# Patient Record
Sex: Female | Born: 1970 | Race: White | Hispanic: No | Marital: Single | Smoking: Current every day smoker
Health system: Southern US, Community
[De-identification: ages and names within clinical notes are randomized; demographics above are authoritative.]

## PROBLEM LIST (undated history)

## (undated) DIAGNOSIS — J449 Chronic obstructive pulmonary disease, unspecified: Secondary | ICD-10-CM

## (undated) DIAGNOSIS — F329 Major depressive disorder, single episode, unspecified: Secondary | ICD-10-CM

## (undated) DIAGNOSIS — C3490 Malignant neoplasm of unspecified part of unspecified bronchus or lung: Secondary | ICD-10-CM

## (undated) DIAGNOSIS — E785 Hyperlipidemia, unspecified: Secondary | ICD-10-CM

## (undated) DIAGNOSIS — C719 Malignant neoplasm of brain, unspecified: Secondary | ICD-10-CM

## (undated) DIAGNOSIS — F1020 Alcohol dependence, uncomplicated: Secondary | ICD-10-CM

## (undated) DIAGNOSIS — F32A Depression, unspecified: Secondary | ICD-10-CM

## (undated) DIAGNOSIS — F419 Anxiety disorder, unspecified: Secondary | ICD-10-CM

## (undated) DIAGNOSIS — N879 Dysplasia of cervix uteri, unspecified: Secondary | ICD-10-CM

## (undated) DIAGNOSIS — K7581 Nonalcoholic steatohepatitis (NASH): Secondary | ICD-10-CM

## (undated) HISTORY — DX: Anxiety disorder, unspecified: F41.9

## (undated) HISTORY — DX: Nonalcoholic steatohepatitis (NASH): K75.81

## (undated) HISTORY — DX: Major depressive disorder, single episode, unspecified: F32.9

## (undated) HISTORY — DX: Depression, unspecified: F32.A

## (undated) HISTORY — DX: Hyperlipidemia, unspecified: E78.5

## (undated) HISTORY — PX: OTHER SURGICAL HISTORY: SHX169

## (undated) HISTORY — PX: DILATION AND CURETTAGE OF UTERUS: SHX78

---

## 1988-11-19 DIAGNOSIS — N879 Dysplasia of cervix uteri, unspecified: Secondary | ICD-10-CM

## 1988-11-19 HISTORY — DX: Dysplasia of cervix uteri, unspecified: N87.9

## 2010-03-17 ENCOUNTER — Inpatient Hospital Stay (HOSPITAL_COMMUNITY): Admission: AD | Admit: 2010-03-17 | Discharge: 2010-03-17 | Payer: Self-pay | Admitting: Obstetrics & Gynecology

## 2011-02-06 LAB — CBC
HCT: 37 % (ref 36.0–46.0)
RBC: 3.79 MIL/uL — ABNORMAL LOW (ref 3.87–5.11)

## 2012-01-14 ENCOUNTER — Other Ambulatory Visit: Payer: Self-pay | Admitting: Family Medicine

## 2012-02-22 ENCOUNTER — Other Ambulatory Visit: Payer: Self-pay | Admitting: Physician Assistant

## 2012-03-25 ENCOUNTER — Telehealth: Payer: Self-pay

## 2012-03-25 MED ORDER — VENLAFAXINE HCL ER 150 MG PO CP24: ORAL_CAPSULE | ORAL | Status: DC

## 2012-03-25 MED ORDER — VENLAFAXINE HCL ER 75 MG PO CP24: ORAL_CAPSULE | ORAL | Status: DC

## 2012-03-25 NOTE — Telephone Encounter (Signed)
Please pull paper chart. We need this to get her dose.

## 2012-03-25 NOTE — Telephone Encounter (Signed)
Can we refill these? 

## 2012-03-25 NOTE — Telephone Encounter (Signed)
.  [facility] The patient called regarding her Effexor XR Rx.  The patient states she took her last dose today and the pharmacy stated they have sent over refill requests x 2.  The patient states she is planning to schedule CPE in next 30 days.  Please call patient at [phone].  This medication should be sent to [facility] on [address] [address] [facility].

## 2012-03-25 NOTE — Telephone Encounter (Signed)
Effexor has been sent in.  Needs CPE

## 2012-03-25 NOTE — Telephone Encounter (Signed)
Chart is in PA stack

## 2012-03-25 NOTE — Telephone Encounter (Signed)
LMOM THAT RX WAS SENT IN AND SHE NEEDS TO HAVE A CPE.

## 2012-04-09 ENCOUNTER — Other Ambulatory Visit: Payer: Self-pay | Admitting: Physician Assistant

## 2012-04-11 ENCOUNTER — Other Ambulatory Visit: Payer: Self-pay | Admitting: Physician Assistant

## 2012-04-17 ENCOUNTER — Ambulatory Visit: Payer: Self-pay | Admitting: Family Medicine

## 2012-04-17 ENCOUNTER — Encounter: Payer: Self-pay | Admitting: Family Medicine

## 2012-04-17 VITALS — BP 114/80 | HR 97 | Temp 98.8°F | Resp 20 | Ht 66.0 in | Wt 140.2 lb

## 2012-04-17 DIAGNOSIS — Z0289 Encounter for other administrative examinations: Secondary | ICD-10-CM

## 2012-04-17 DIAGNOSIS — Z1239 Encounter for other screening for malignant neoplasm of breast: Secondary | ICD-10-CM

## 2012-04-17 DIAGNOSIS — E785 Hyperlipidemia, unspecified: Secondary | ICD-10-CM

## 2012-04-17 DIAGNOSIS — Z720 Tobacco use: Secondary | ICD-10-CM | POA: Insufficient documentation

## 2012-04-17 DIAGNOSIS — F411 Generalized anxiety disorder: Secondary | ICD-10-CM

## 2012-04-17 DIAGNOSIS — Z0000 Encounter for general adult medical examination without abnormal findings: Secondary | ICD-10-CM

## 2012-04-17 DIAGNOSIS — F409 Phobic anxiety disorder, unspecified: Secondary | ICD-10-CM

## 2012-04-17 LAB — COMPREHENSIVE METABOLIC PANEL
ALT: 38 U/L — ABNORMAL HIGH (ref 0–35)
AST: 43 U/L — ABNORMAL HIGH (ref 0–37)
Alkaline Phosphatase: 77 U/L (ref 39–117)
CO2: 24 mEq/L (ref 19–32)
Chloride: 100 mEq/L (ref 96–112)
Creat: 0.59 mg/dL (ref 0.50–1.10)
Glucose, Bld: 68 mg/dL — ABNORMAL LOW (ref 70–99)
Potassium: 4.6 mEq/L (ref 3.5–5.3)

## 2012-04-17 LAB — POCT URINALYSIS DIPSTICK
Bilirubin, UA: NEGATIVE
Blood, UA: NEGATIVE
Leukocytes, UA: NEGATIVE
Urobilinogen, UA: 0.2

## 2012-04-17 LAB — LIPID PANEL
Cholesterol: 263 mg/dL — ABNORMAL HIGH (ref 0–200)
HDL: 61 mg/dL (ref >39)

## 2012-04-17 MED ORDER — VENLAFAXINE HCL ER 75 MG PO CP24: ORAL_CAPSULE | ORAL | Status: DC

## 2012-04-17 MED ORDER — ALPRAZOLAM 0.5 MG PO TABS: ORAL_TABLET | ORAL | Status: DC

## 2012-04-17 MED ORDER — VENLAFAXINE HCL ER 150 MG PO CP24: ORAL_CAPSULE | ORAL | Status: DC

## 2012-04-17 NOTE — Patient Instructions (Signed)
Vitamin D Deficiency  Not having enough vitamin D is called a deficiency. Your body needs this vitamin to keep your bones strong and healthy. Having too little of it can make your bones soft or can cause other health problems.  HOME CARE  Take all vitamins, herbs, or nutrition drinks (supplements) as told by your doctor.   Have your blood tested 2 months after taking vitamins, herbs, or nutrition drinks.   Eat foods that have vitamin D. This includes:   Dairy products, cereals, or juices with added vitamin D. Check the label.   Fatty fish like salmon or trout.   Eggs.   Oysters.   Go outside for 10 to 15 minutes when the sun is shining. Do this 3 times a week. Do not do this if you have skin cancer.   Do not use tanning beds.   Stay at a healthy weight. Lose weight if needed.   Keep all doctor visits as told.  GET HELP IF:  You have questions.   You continue to have problems.   You feel sick to your stomach (nauseous) or throw up (vomit).   You cannot go poop (constipated).   You feel confused.   You have severe belly (abdominal) or back pain.  MAKE SURE YOU:  Understand these instructions.   Will watch your condition.   Will get help right away if you are not doing well or get worse.  Document Released: 10/25/2011 Document Reviewed: 10/23/2011 ExitCare Patient Information 2012 [employer]   .Keeping You Healthy  Get These Tests 1. Blood Pressure- Have your blood pressure checked once a year by your health care provider.  Normal blood pressure is 120/80. 2. Weight- Have your body mass index (BMI) calculated to screen for obesity.  BMI is measure of body fat based on height and weight.  You can also calculate your own BMI at www.nhlbisupport.com/bmi/. 3. Cholesterol- Have your cholesterol checked every 5 years starting at age 20 then yearly starting at age 45. 4. Chlamydia, HIV, and other sexually transmitted diseases- Get screened every year until age 25,  then within three months of each new sexual provider. 5. Pap Smear- Every 1-3 years; discuss with your health care provider. 6. Mammogram- Every year starting at age 40  Take these medicines  Calcium with Vitamin D-Your body needs 1200 mg of Calcium each day and 800-1000 IU of Vitamin D daily.  Your body can only absorb 500 mg of Calcium at a time so Calcium must be taken in 2 or 3 divided doses throughout the day.  Multivitamin with folic acid- Once daily if it is possible for you to become pregnant.  Get these Immunizations  Gardasil-Series of three doses; prevents HPV related illness such as genital warts and cervical cancer.  Menactra-Single dose; prevents meningitis.  Tetanus shot- Every 10 years.  Flu shot-Every year.  Take these steps 1. Do not smoke-Your healthcare provider can help you quit.  For tips on how to quit go to www.smokefree.gov or call 1-800 QUITNOW. 2. Be physically active- Exercise 5 days a week for at least 30 minutes.  If you are not already physically active, start slow and gradually work up to 30 minutes of moderate physical activity.  Examples of moderate activity include walking briskly, dancing, swimming, bicycling, etc. 3. Breast Cancer- A self breast exam every month is important for early detection of breast cancer.  For more information and instruction on self breast exams, ask your healthcare provider or www.womenshealth.gov/faq/breast-self-exam.cfm. 4.   Eat a healthy diet- Eat a variety of healthy foods such as fruits, vegetables, whole grains, low fat milk, low fat cheeses, yogurt, lean meats, poultry and fish, beans, nuts, tofu, etc.  For more information go to www. Thenutritionsource.org 5. Drink alcohol in moderation- Limit alcohol intake to one drink or less per day. Never drink and drive. 6. Depression- Your emotional health is as important as your physical health.  If you're feeling down or losing interest in things you normally enjoy please talk to  your healthcare provider about being screened for depression. 7. Dental visit- Brush and floss your teeth twice daily; visit your dentist twice a year. 8. Eye doctor- Get an eye exam at least every 2 years. 9. Helmet use- Always wear a helmet when riding a bicycle, motorcycle, rollerblading or skateboarding. 10. Safe sex- If you may be exposed to sexually transmitted infections, use a condom. 11. Seat belts- Seat belts can save your live; always wear one. 12. Smoke/Carbon Monoxide detectors- These detectors need to be installed on the appropriate level of your home. Replace batteries at least once a year. 13. Skin cancer- When out in the sun please cover up and use sunscreen 15 SPF or higher. 14. Violence- If anyone is threatening or hurting you, please tell your healthcare provider.        

## 2012-04-17 NOTE — Progress Notes (Signed)
Subjective:    Patient ID: Erika Hunter, female    DOB: March 02, 1971, 41 y.o.   MRN: [id number]  HPI  This 41 y.o Cauc female is here for CPE (PAP done Sep 13, 2011- negative/normal). She has chronic  GAD , treated effectively with Alprazolam and Effexor (generic).  She is a smoker (no desire to quit) and   drinks 2 Red Bulls every AM. She is a social drinker. She is divorced and works as a Solicitor. No regular exercise.  ECG done 01/23/2011 showed NSR, no acute findings. She has not had MMG because of noninsured status.    Review of Systems  Constitutional: Negative.   HENT: Positive for congestion and mouth sores. Negative for sore throat, rhinorrhea, sneezing, postnasal drip and sinus pressure.   Eyes:       Dry eyes  Respiratory: Positive for cough and chest tightness. Negative for apnea, shortness of breath and wheezing.   Cardiovascular: Negative.   Gastrointestinal: Negative.   Genitourinary: Positive for genital sores. Negative for dysuria, frequency, hematuria, vaginal bleeding, vaginal discharge and pelvic pain.       "bump"on perineum that she asked her daughter to squeeze- no pus expressed  Menses "erratic"  Musculoskeletal: Negative.   Skin: Positive for pallor.       Lower legs with mottled discoloration all her life  Neurological: Negative.   Psychiatric/Behavioral: Positive for dysphoric mood and agitation. Negative for suicidal ideas, sleep disturbance and decreased concentration. The patient is nervous/anxious.        Objective:   Physical Exam  Vitals reviewed. Constitutional: She is oriented to person, place, and time. She appears well-developed and well-nourished. No distress.  HENT:  Head: Normocephalic and atraumatic.  Right Ear: External ear normal.  Left Ear: External ear normal.  Nose: Nose normal.  Mouth/Throat: Oropharynx is clear and moist. No oropharyngeal exudate.       Post pharynx erythematous  Eyes: Conjunctivae and EOM are normal. Pupils are  equal, round, and reactive to light. No scleral icterus.  Neck: Normal range of motion. Neck supple. No thyromegaly present.  Cardiovascular: Normal rate, regular rhythm, normal heart sounds and intact distal pulses.  Exam reveals no gallop and no friction rub.   No murmur heard. Pulmonary/Chest: Effort normal and breath sounds normal. No respiratory distress. She has no wheezes. Right breast exhibits no inverted nipple, no mass, no nipple discharge, no skin change and no tenderness. Left breast exhibits no inverted nipple, no mass, no nipple discharge, no skin change and no tenderness. Breasts are symmetrical.  Abdominal: Soft. Bowel sounds are normal. She exhibits no mass. There is no rebound and no guarding.       Mild RLQ tenderness  Musculoskeletal: Normal range of motion. She exhibits no edema and no tenderness.  Lymphadenopathy:    She has no cervical adenopathy.  Neurological: She is alert and oriented to person, place, and time. She has normal reflexes. No cranial nerve deficit. She exhibits normal muscle tone. Coordination normal.  Skin: Skin is warm and dry.       Mottled skin a few inches above and below knee area  Psychiatric: She has a normal mood and affect. Her behavior is normal. Judgment and thought content normal.    Results for orders placed in visit on 04/17/12  POCT URINALYSIS DIPSTICK      Component Value Range   Color, UA yellow     Clarity, UA clear     Glucose, UA neg  Bilirubin, UA neg     Ketones, UA trace     Spec Grav, UA 1.010     Blood, UA neg     pH, UA 7.5     Protein, UA trace     Urobilinogen, UA 0.2     Nitrite, UA neg     Leukocytes, UA Negative     LABS 01/25/2011:  CR= 0.66  AST/SGOT= 41   ALT/SGPT= 54   TChol= 284  HDL= 74  LDL= 187   TGs= 113      Assessment & Plan:   1. Routine general medical examination at a health care facility  Lipid panel, Comprehensive metabolic panel, POCT urinalysis dipstick  2. Hyperlipidemia LDL goal < 100   Pt has lost ~ 25-30 lbs in last 18 months (expect lipid profile to be improved)  3. Generalized anxiety disorder  RF: Venlafaxine XR  75 mg+ 150 mg (225 mg total daily dose) for 12 months Continue Alprazolam prn   4. Screening for breast cancer  MM Digital Screening  5. Tobacco user  Pt not interested in quitting at this time

## 2012-04-23 ENCOUNTER — Encounter: Payer: Self-pay | Admitting: Family Medicine

## 2012-04-23 ENCOUNTER — Other Ambulatory Visit: Payer: Self-pay | Admitting: Family Medicine

## 2012-04-23 DIAGNOSIS — E785 Hyperlipidemia, unspecified: Secondary | ICD-10-CM

## 2012-04-23 NOTE — Progress Notes (Signed)
Quick Note:  Please call pt and advise that the following labs are abnormal...  Total chol and LDL("bad") chol are still elevated. Try Omega 3 Fish Oil 1200 mg OTC Take 1 capsule daily and will recheck lipids in 8 weeks. If still elevated, need to consider prescription medication.  Do not want to use statin to lower lipids because liver tests are very slightly elevated (probably due to "fatty liver"). Blood sugar and kidney function are normal.   Copy of labs to pt. ______

## 2012-05-07 ENCOUNTER — Ambulatory Visit: Payer: Self-pay

## 2012-05-07 ENCOUNTER — Ambulatory Visit: Payer: Self-pay | Admitting: Emergency Medicine

## 2012-05-07 VITALS — BP 121/84 | HR 105 | Temp 99.1°F | Resp 20 | Ht 64.5 in | Wt 140.8 lb

## 2012-05-07 DIAGNOSIS — M79643 Pain in unspecified hand: Secondary | ICD-10-CM

## 2012-05-07 DIAGNOSIS — M79609 Pain in unspecified limb: Secondary | ICD-10-CM

## 2012-05-07 NOTE — Progress Notes (Signed)
  Subjective:    Patient ID: Erika Hunter, female    DOB: February 20, 1971, 41 y.o.   MRN: [id number]  Hand Pain  The incident occurred 2 days ago. The incident occurred at home. The injury mechanism was a fall. The pain is present in the right hand. The quality of the pain is described as aching. The pain radiates to the right arm. The pain is at a severity of 3/10. The pain is mild. The pain has been constant since the incident. The symptoms are aggravated by movement and lifting. She has tried acetaminophen for the symptoms.      Review of Systems  Constitutional: Negative.   HENT: Negative.   Eyes: Negative.   Respiratory: Negative.   Cardiovascular: Negative.   Gastrointestinal: Negative.   Musculoskeletal: Positive for arthralgias. Negative for joint swelling.  Neurological: Negative.        Objective:   Physical Exam  Nursing note and vitals reviewed. Constitutional: She is oriented to person, place, and time. She appears well-developed and well-nourished.  HENT:  Head: Normocephalic and atraumatic.  Eyes: Conjunctivae are normal. Pupils are equal, round, and reactive to light. No scleral icterus.  Neck: Normal range of motion. Neck supple.  Cardiovascular: Normal rate.   Pulmonary/Chest: Effort normal.  Abdominal: Soft.  Musculoskeletal: Normal range of motion. She exhibits tenderness (dorsum right hand).  Neurological: She is alert and oriented to person, place, and time.  Skin: Skin is warm and dry.          Assessment & Plan:  Sprain hand  Splint RICE Motrin  [facility] reading (PRIMARY) by  Dr. Dareen Piano.  Negative.

## 2012-09-30 ENCOUNTER — Ambulatory Visit: Payer: Self-pay | Admitting: Family Medicine

## 2012-10-10 ENCOUNTER — Other Ambulatory Visit: Payer: Self-pay | Admitting: Family Medicine

## 2012-10-12 ENCOUNTER — Other Ambulatory Visit: Payer: Self-pay | Admitting: Family Medicine

## 2012-10-12 MED ORDER — ALPRAZOLAM 0.5 MG PO TABS: ORAL_TABLET | ORAL | Status: DC

## 2012-10-12 NOTE — Telephone Encounter (Signed)
I called med refill to pharmacy; #30 authorized with 1 additional refill.

## 2012-10-12 NOTE — Progress Notes (Signed)
I reviewed the pt's chart and called in refill for Alprazolam to pharmacy. Pt to be notified.

## 2012-12-22 ENCOUNTER — Other Ambulatory Visit: Payer: Self-pay | Admitting: Family Medicine

## 2012-12-22 NOTE — Telephone Encounter (Signed)
Alprazolam 0.5 mg tab  #30 w/ 1 refill was phoned in to [facility] at [address].

## 2012-12-23 NOTE — Telephone Encounter (Signed)
This medication was called to pharmacy on 12/22/12.

## 2013-01-08 ENCOUNTER — Other Ambulatory Visit: Payer: Self-pay | Admitting: Family Medicine

## 2013-01-09 NOTE — Telephone Encounter (Signed)
Needs office visit.

## 2013-01-09 NOTE — Telephone Encounter (Signed)
Chart pulled to PA pool [id number]

## 2013-01-09 NOTE — Telephone Encounter (Signed)
Please pull paper chart, medication not in epic.

## 2013-03-24 ENCOUNTER — Other Ambulatory Visit: Payer: Self-pay | Admitting: Family Medicine

## 2013-03-25 ENCOUNTER — Other Ambulatory Visit: Payer: Self-pay | Admitting: Family Medicine

## 2013-05-13 ENCOUNTER — Other Ambulatory Visit: Payer: Self-pay | Admitting: Physician Assistant

## 2013-05-26 ENCOUNTER — Other Ambulatory Visit: Payer: Self-pay | Admitting: Family Medicine

## 2013-05-27 ENCOUNTER — Telehealth: Payer: Self-pay

## 2013-05-27 MED ORDER — ALPRAZOLAM 0.5 MG PO TABS: ORAL_TABLET | ORAL | Status: DC

## 2013-05-27 NOTE — Telephone Encounter (Signed)
Pharm reqs RF of xanax 0.5 mg. Pt hasn't been seen in > year. I didn't know if you would want to refuse, or send in w/note needs to RTC for add'l RFs? I have pended it w/note for your review.

## 2013-05-27 NOTE — Telephone Encounter (Signed)
I phoned refill for Alprazolam to pharmacy.

## 2013-05-27 NOTE — Telephone Encounter (Signed)
Needs OV, last seen over 1 yr ago 

## 2013-07-07 ENCOUNTER — Telehealth: Payer: Self-pay

## 2013-07-07 NOTE — Telephone Encounter (Signed)
PT WANT TO SPEAK WITH A DR OR NURSE ABOUT SOME MEDICATION AND SHE DIDN'T WANT TO EXPLAIN TO ME ANYTHING ABOUT IT. STATES SHE ONLY WANT TO SPEAK WITH THEM PLEASE CALL [phone]

## 2013-07-07 NOTE — Telephone Encounter (Signed)
Left message to return call 

## 2013-07-08 ENCOUNTER — Telehealth: Payer: Self-pay

## 2013-07-08 NOTE — Telephone Encounter (Signed)
PT STATES SOMEONE HAD CALLED AND SHE MISSED THE CALL, IS AT WORK NOW AND WOULD LIKE A CALL BACK AT [phone]

## 2013-07-08 NOTE — Telephone Encounter (Signed)
Left message on machine for patient to call back.

## 2013-07-09 MED ORDER — VENLAFAXINE HCL ER 75 MG PO CP24: ORAL_CAPSULE | ORAL | Status: DC

## 2013-07-09 MED ORDER — ALPRAZOLAM 0.5 MG PO TABS: ORAL_TABLET | ORAL | Status: DC

## 2013-07-09 MED ORDER — VENLAFAXINE HCL ER 150 MG PO CP24: ORAL_CAPSULE | ORAL | Status: DC

## 2013-07-09 NOTE — Telephone Encounter (Signed)
1. She can still pay in installments, but then doesn't qualify for the 30% discount for payment in full.  She may qualify for financial assistance for patients (FAPP), and can call  [phone].  2. I've refilled the meds for 1 month (she hasn't been seen since 04/2012). She needs an OV for more. PLEASE PHONE IN the [facility] as written below.  Meds ordered this encounter  Medications  . ALPRAZolam (XANAX) 0.5 MG tablet    Sig: Take one half to one tablet by mouth every day as needed for anxiety. PATIENT NEEDS OFFICE VISIT FOR ADDITIONAL REFILLS    Dispense:  30 tablet    Refill:  0    Order Specific Question:  Supervising Provider    Answer:  DOOLITTLE, ROBERT P [[id number]]  . venlafaxine XR (EFFEXOR-XR) 150 MG 24 hr capsule    Sig: TAKE 1 CAPSULE BY MOUTH ONCE DAILY ALONG WITH 75MG  CAPSULE    Dispense:  30 capsule    Refill:  0    NEEDS OFFICE VISIT!!!    Order Specific Question:  Supervising Provider    Answer:  DOOLITTLE, ROBERT P [[id number]]  . venlafaxine XR (EFFEXOR-XR) 75 MG 24 hr capsule    Sig: TAKE 1 CAPSULE BY MOUTH ONCE DAILY ALONG WITH 150MG  CAPSULE    Dispense:  30 capsule    Refill:  0    NEEDS OFFICE VISIT!!!    Order Specific Question:  Supervising Provider    Answer:  Ellamae Sia P [[id number]]

## 2013-07-09 NOTE — Telephone Encounter (Signed)
Ok. Called in Xanax. Called pt to let her know. Spoke with pt.and gave her the number for FAPP.

## 2013-07-09 NOTE — Telephone Encounter (Signed)
Spoke with pt she is out of refills on all her meds. Alprazolam, Effexor 150 mg, and Effexor 75 mg. She states that she has to save up to come in because since [facility] has taken over she has to pay in full now when before she made 50 dollar payments until her bill was paid off. She was wondering if she could have 2 months worth of medication. Please advise. Dr Audria Nine still out of town?

## 2013-07-09 NOTE — Telephone Encounter (Signed)
Called pt again, LMOM to CB if she still needed our advise.

## 2013-08-13 ENCOUNTER — Ambulatory Visit: Payer: Self-pay | Admitting: Family Medicine

## 2013-08-13 ENCOUNTER — Encounter: Payer: Self-pay | Admitting: Family Medicine

## 2013-08-13 VITALS — HR 88 | Temp 99.9°F | Resp 16 | Ht 64.5 in | Wt 139.4 lb

## 2013-08-13 DIAGNOSIS — E7800 Pure hypercholesterolemia, unspecified: Secondary | ICD-10-CM

## 2013-08-13 DIAGNOSIS — Z0000 Encounter for general adult medical examination without abnormal findings: Secondary | ICD-10-CM

## 2013-08-13 DIAGNOSIS — Z1231 Encounter for screening mammogram for malignant neoplasm of breast: Secondary | ICD-10-CM

## 2013-08-13 DIAGNOSIS — F411 Generalized anxiety disorder: Secondary | ICD-10-CM

## 2013-08-13 LAB — POCT URINALYSIS DIPSTICK
Glucose, UA: NEGATIVE
Nitrite, UA: NEGATIVE
Protein, UA: NEGATIVE
pH, UA: 8.5

## 2013-08-13 MED ORDER — ALPRAZOLAM 0.5 MG PO TABS: ORAL_TABLET | ORAL | Status: DC

## 2013-08-13 MED ORDER — ALBUTEROL SULFATE HFA 108 (90 BASE) MCG/ACT IN AERS: 2.0000 | INHALATION_SPRAY | RESPIRATORY_TRACT | Status: DC | PRN

## 2013-08-13 MED ORDER — VENLAFAXINE HCL ER 150 MG PO CP24: ORAL_CAPSULE | ORAL | Status: DC

## 2013-08-13 MED ORDER — VENLAFAXINE HCL ER 75 MG PO CP24: ORAL_CAPSULE | ORAL | Status: DC

## 2013-08-13 NOTE — Progress Notes (Signed)
Subjective:    Patient ID: Erika Hunter, female    DOB: 1971/06/28, 42 y.o.   MRN: [id number]  HPI  This 42 y.o. Cauc female is here for annual exam and fasting labs. She has chronic GAD and hypercholesterolemia for which she takes prescription medications. She reports no adverse effects.   HCM: PAP- 2012 (negative)            MMG- needs to be scheduled.            IMM- current.            Vision- current.  Patient Active Problem List   Diagnosis Date Noted  . Hyperlipidemia LDL goal < 100 04/17/2012  . Generalized anxiety disorder 04/17/2012  . Tobacco user 04/17/2012    Review of Systems  Constitutional: Negative.   HENT: Negative.   Eyes: Positive for photophobia and redness.  Respiratory: Positive for chest tightness and wheezing.   Endocrine: Negative.   Genitourinary: Negative.   Musculoskeletal: Negative.   Skin: Negative.   Allergic/Immunologic: Negative.   Neurological: Negative.   Hematological: Negative.   Psychiatric/Behavioral: Negative.        Objective:   Physical Exam  Nursing note and vitals reviewed. Constitutional: She is oriented to person, place, and time. Vital signs are normal. She appears well-developed and well-nourished. No distress.  HENT:  Head: Normocephalic and atraumatic.  Right Ear: Hearing, tympanic membrane, external ear and ear canal normal.  Left Ear: Hearing, tympanic membrane, external ear and ear canal normal.  Nose: Nose normal. No mucosal edema, rhinorrhea, nasal deformity or septal deviation.  Mouth/Throat: Uvula is midline, oropharynx is clear and moist and mucous membranes are normal. No oral lesions. Normal dentition. No dental caries.  Eyes: EOM and lids are normal. Right conjunctiva is injected. Left conjunctiva is injected. No scleral icterus.  Fundoscopic exam:      The right eye shows no arteriolar narrowing, no AV nicking and no papilledema. The right eye shows red reflex.       The left eye shows no arteriolar  narrowing, no AV nicking and no papilledema. The left eye shows red reflex.  Neck: Full passive range of motion without pain. Neck supple. No JVD present. No spinous process tenderness and no muscular tenderness present. No erythema and normal range of motion present. No mass and no thyromegaly present.  Cardiovascular: Normal rate, regular rhythm, S1 normal, S2 normal, normal heart sounds, intact distal pulses and normal pulses.   No extrasystoles are present. PMI is not displaced.  Exam reveals no gallop, no distant heart sounds and no friction rub.   No murmur heard. Pulmonary/Chest: Effort normal and breath sounds normal. No respiratory distress. Right breast exhibits no inverted nipple, no mass, no nipple discharge, no skin change and no tenderness. Left breast exhibits no inverted nipple, no mass, no nipple discharge, no skin change and no tenderness. Breasts are symmetrical.  Abdominal: Soft. Normal appearance and bowel sounds are normal. She exhibits no distension, no pulsatile midline mass and no mass. There is no hepatosplenomegaly. There is no tenderness. There is no guarding and no CVA tenderness. No hernia.  Musculoskeletal: Normal range of motion. She exhibits no edema and no tenderness.  Lymphadenopathy:       Head (right side): No submental, no submandibular, no tonsillar, no posterior auricular and no occipital adenopathy present.       Head (left side): No submental, no submandibular, no tonsillar and no occipital adenopathy present.    She  has no cervical adenopathy.    She has no axillary adenopathy.       Right: No inguinal and no supraclavicular adenopathy present.       Left: No inguinal and no supraclavicular adenopathy present.  Neurological: She is alert and oriented to person, place, and time. She has normal strength and normal reflexes. She displays no atrophy. No cranial nerve deficit or sensory deficit. She exhibits normal muscle tone. Coordination and gait normal.  Skin:  Skin is warm, dry and intact. No ecchymosis, no lesion and no rash noted. She is not diaphoretic. No cyanosis or erythema. No pallor. Nails show no clubbing.  Psychiatric: She has a normal mood and affect. Her speech is normal and behavior is normal. Judgment and thought content normal. Cognition and memory are normal.       Assessment & Plan:  Routine general medical examination at a health care facility - Plan: POCT urinalysis dipstick  Pure hypercholesterolemia - Plan: Lipid panel  Generalized anxiety disorder  Other screening mammogram - Plan: MM Digital Screening  Meds ordered this encounter  Medications  . Multiple Vitamins-Minerals (MULTI-VITAMIN GUMMIES PO)    Sig: Take by mouth daily.  . Omega-3 Fatty Acids (FISH OIL PO)    Sig: Take by mouth daily.  Marland Kitchen albuterol (PROAIR HFA) 108 (90 BASE) MCG/ACT inhaler    Sig: Inhale 2 puffs into the lungs every 4 (four) hours as needed for wheezing.    Dispense:  1 each    Refill:  5  . venlafaxine XR (EFFEXOR-XR) 150 MG 24 hr capsule    Sig: TAKE 1 CAPSULE BY MOUTH ONCE DAILY ALONG WITH 75MG  CAPSULE    Dispense:  90 capsule    Refill:  0            . venlafaxine XR (EFFEXOR-XR) 75 MG 24 hr capsule    Sig: TAKE 1 CAPSULE BY MOUTH ONCE DAILY ALONG WITH 150MG  CAPSULE    Dispense:  90 capsule    Refill:  3            . ALPRAZolam (XANAX) 0.5 MG tablet    Sig: Take one half to one tablet by mouth every day as needed for anxiety.    Dispense:  30 tablet    Refill:  2

## 2013-08-13 NOTE — Patient Instructions (Signed)
Keeping You Healthy  Get These Tests 1. Blood Pressure- Have your blood pressure checked once a year by your health care provider.  Normal blood pressure is 120/80. 2. Weight- Have your body mass index (BMI) calculated to screen for obesity.  BMI is measure of body fat based on height and weight.  You can also calculate your own BMI at https://www.west-esparza.com/. 3. Cholesterol- Have your cholesterol checked every 5 years starting at age 44 then yearly starting at age 54. 4. Chlamydia, HIV, and other sexually transmitted diseases- Get screened every year until age 58, then within three months of each new sexual provider. 5. Pap Smear- Every 1-3 years; discuss with your health care provider. Next PAP due in October 2015. 6. Mammogram- Every year starting at age 27. I have placed an order for this screening; [facility] has a financial assistance program and can perhaps discount the cost since you are self-pay.  Take these medicines  Calcium with Vitamin D-Your body needs 1200 mg of Calcium each day and [phone] IU of Vitamin D daily.  Your body can only absorb 500 mg of Calcium at a time so Calcium must be taken in 2 or 3 divided doses throughout the day.  Multivitamin with folic acid- Once daily if it is possible for you to become pregnant.  Get these Immunizations  Gardasil-Series of three doses; prevents HPV related illness such as genital warts and cervical cancer.  Menactra-Single dose; prevents meningitis.  Tetanus shot- Every 10 years. Tdap was given in March 2012; next Tetanus id due in 2022.  Flu shot-Every year.  Take these steps 1. Do not smoke-Your healthcare provider can help you quit.  For tips on how to quit go to www.smokefree.gov or call 1-800 QUITNOW. 2. Be physically active- Exercise 5 days a week for at least 30 minutes.  If you are not already physically active, start slow and gradually work up to 30 minutes of moderate physical activity.  Examples of moderate  activity include walking briskly, dancing, swimming, bicycling, etc. 3. Breast Cancer- A self breast exam every month is important for early detection of breast cancer.  For more information and instruction on self breast exams, ask your healthcare provider or [email or web address].[email or web address]. 4. Eat a healthy diet- Eat a variety of healthy foods such as fruits, vegetables, whole grains, low fat milk, low fat cheeses, yogurt, lean meats, poultry and fish, beans, nuts, tofu, etc.  For more information go to www. Thenutritionsource.org 5. Drink alcohol in moderation- Limit alcohol intake to one drink or less per day. Never drink and drive. 6. Depression- Your emotional health is as important as your physical health.  If you're feeling down or losing interest in things you normally enjoy please talk to your healthcare provider about being screened for depression. 7. Dental visit- Brush and floss your teeth twice daily; visit your dentist twice a year. 8. Eye doctor- Get an eye exam at least every 2 years. 9. Helmet use- Always wear a helmet when riding a bicycle, motorcycle, rollerblading or skateboarding. 10. Safe sex- If you may be exposed to sexually transmitted infections, use a condom. 11. Seat belts- Seat belts can save your live; always wear one. 12. Smoke/Carbon Monoxide detectors- These detectors need to be installed on the appropriate level of your home. Replace batteries at least once a year. 13. Skin cancer- When out in the sun please cover up and use sunscreen 15 SPF or higher. 14. Violence- If anyone is threatening or hurting you, please tell  your healthcare provider.

## 2013-08-14 LAB — LIPID PANEL
Cholesterol: 235 mg/dL — ABNORMAL HIGH (ref 0–200)
HDL: 62 mg/dL (ref >39)
VLDL: 27 mg/dL (ref 0–40)

## 2013-08-20 NOTE — Progress Notes (Signed)
Quick Note:  Please advise pt regarding following labs...  Total and LDL ("bad") cholesterol are still above normal but much better than 1 year ago. Continue taking Fish Oil 2 grams daily and eat as healthy as possible. Stay active. Your heart disease risk is below average.   Copy to pt.. ______

## 2013-11-10 ENCOUNTER — Other Ambulatory Visit: Payer: Self-pay | Admitting: Family Medicine

## 2013-11-18 ENCOUNTER — Other Ambulatory Visit: Payer: Self-pay | Admitting: Family Medicine

## 2013-11-20 ENCOUNTER — Other Ambulatory Visit: Payer: Self-pay | Admitting: Family Medicine

## 2013-11-20 NOTE — Telephone Encounter (Signed)
Alprazolam refill phoned to pt's pharmacy.

## 2013-11-23 ENCOUNTER — Telehealth: Payer: Self-pay

## 2013-11-23 MED ORDER — VENLAFAXINE HCL ER 150 MG PO CP24: ORAL_CAPSULE | ORAL | Status: DC

## 2013-11-23 NOTE — Telephone Encounter (Signed)
Resent to the pharmacy, was previously sent to [facility] sent to [facility]

## 2013-11-23 NOTE — Telephone Encounter (Signed)
Patient calling to let Dr. Leward Quan to please fix rx. To calrify that she did not receive a year supply on the venlafaxine XR (EFFEXOR-XR) 150 MG 24 hr capsule and would like that fixed as soon as possible. She needs 3 more refills on it to make it a year.   Pharmacy: [facility] [address] ([address]), [place] - [place]  [place]: [phone]

## 2013-12-04 ENCOUNTER — Other Ambulatory Visit: Payer: Self-pay | Admitting: Physician Assistant

## 2014-02-22 ENCOUNTER — Other Ambulatory Visit: Payer: Self-pay | Admitting: Family Medicine

## 2014-02-22 NOTE — Telephone Encounter (Signed)
Alprazolam refill phoned to pt's pharmacy.

## 2014-03-15 ENCOUNTER — Ambulatory Visit: Payer: Self-pay | Admitting: Physician Assistant

## 2014-03-15 ENCOUNTER — Encounter: Payer: Self-pay | Admitting: Physician Assistant

## 2014-03-15 VITALS — BP 126/81 | HR 87 | Temp 98.3°F | Resp 16 | Ht 65.5 in | Wt 137.0 lb

## 2014-03-15 DIAGNOSIS — F32A Depression, unspecified: Secondary | ICD-10-CM

## 2014-03-15 DIAGNOSIS — L723 Sebaceous cyst: Secondary | ICD-10-CM

## 2014-03-15 DIAGNOSIS — F329 Major depressive disorder, single episode, unspecified: Secondary | ICD-10-CM

## 2014-03-15 MED ORDER — VENLAFAXINE HCL ER 150 MG PO CP24: ORAL_CAPSULE | ORAL | Status: DC

## 2014-03-15 NOTE — Progress Notes (Signed)
Subjective:    Patient ID: Erika Hunter, female    DOB: 11-Nov-1971, 43 y.o.   MRN: [id number]  HPI Primary Physician: No primary provider on file.  Chief Complaint: Cyst  HPI: 43 y.o. female with history below presents for cyst removal. Started off the size of a pimple 5 years ago and has slowly gotten a little bigger over time. Never with pain or erythema. Never with drainage or discharge. She has tried to express drainage from the lesion at home without success. She is concerned due to the lack of hair growth along the lesion. Afebrile.   She also requests for 2 more refills of her Effexor 150 mg XR to be sent in. This is managed by Erika Hunter. She takes Effexor 75 mg XR as well as Effexor 150 mg XR daily. When the 150 mg was last sent in it did not get sent in for the same time span as her 75 mg dose. Tolerating well. No issues.    Past Medical History  Diagnosis Date  . Anxiety   . Asthma   . Depression   . Hyperlipidemia   . Cancer      Home Meds: Prior to Admission medications   Medication Sig Start Date End Date Taking? Authorizing Provider  albuterol (PROAIR HFA) 108 (90 BASE) MCG/ACT inhaler Inhale 2 puffs into the lungs every 4 (four) hours as needed for wheezing or shortness of breath. 12/04/13  Yes Erika Fanny, MD  ALPRAZolam Duanne Moron) 0.5 MG tablet TAKE ONE-HALF TO ONE TABLET BY MOUTH ONCE DAILY AS NEEDED FOR ANXIETY 02/22/14  Yes Erika Fanny, MD  Multiple Vitamins-Minerals (MULTI-VITAMIN GUMMIES PO) Take by mouth daily.   Yes Historical Provider, MD  Omega-3 Fatty Acids (FISH OIL PO) Take by mouth daily.   Yes Historical Provider, MD  venlafaxine XR (EFFEXOR-XR) 150 MG 24 hr capsule TAKE 1 CAPSULE BY MOUTH ONCE DAILY ALONG WITH 75MG  CAPSULE 11/23/13  Yes Erika S Jeffery, PA-C  venlafaxine XR (EFFEXOR-XR) 75 MG 24 hr capsule TAKE 1 CAPSULE BY MOUTH ONCE DAILY ALONG WITH 150MG  CAPSULE 08/13/13  Yes Erika Fanny, MD    Allergies: No Known  Allergies  History   Social History  . Marital Status: Single    Spouse Name: N/A    Number of Children: N/A  . Years of Education: N/A   Occupational History  . Not on file.   Social History Main Topics  . Smoking status: Current Every Day Smoker -- 1.00 packs/day for 25 years  . Smokeless tobacco: Not on file  . Alcohol Use: Yes  . Drug Use: No  . Sexual Activity: Yes   Other Topics Concern  . Not on file   Social History Narrative  . No narrative on file     Review of Systems     Objective:   Physical Exam  Physical Exam: Blood pressure 126/81, pulse 87, temperature 98.3 F (36.8 C), temperature source Oral, resp. rate 16, height 5' 5.5" (1.664 m), weight 137 lb (62.143 kg), SpO2 95.00%., Body mass index is 22.44 kg/(m^2). General: Well developed, well nourished, in no acute distress. Head: Normocephalic, atraumatic, eyes without discharge, sclera non-icteric, nares are without discharge.   Neck: Supple. Full ROM.  Lungs: Breathing is unlabored. Heart: Regular rate. Msk:  Strength and tone normal for age. Extremities/Skin: Warm and dry. No clubbing or cyanosis. No edema. No rashes. Just to the left of midline along the parietal region of the scalp there is  a 2 cm x 2 cm soft, non erythematous lesion.  Neuro: Alert and oriented X 3. Moves all extremities spontaneously. Gait is normal. CNII-XII grossly in tact. Psych:  Responds to questions appropriately with a normal affect.      PROCEDURE NOTE: Verbal consent obtained. Risks and benefits of the procedure were explained to the patient. Patient made an informed decision to proceed with the procedure. Betadine prep per usual protocol. Local anesthesia obtained with 1% lidocaine with epi 0.5 cc.  1 cm incision made with 11 blade along lesion.  No culture taken. No purulence expressed. Moderate sebaceous material expressed. Cyst wall removed in pieces.  Lesion explored revealing no loculations. Irrigated  with normal saline.  2 simple interrupted sutures of 5-0 Prolene placed.  Wound care instructions including precautions with patient. Patient tolerated the procedure well. Recheck in 7 days           Assessment & Plan:  43 year old female with sebaceous cyst and depression  1) Sebaceous cyst -I&D per above -Wound care -SR 7 days -Advised patient hair may not grow back although there is hair along the border of the lesion  2) Depression -Well controlled -Needs further refills called in on her Effexor 150 mg XR -Effexor 150 mg XR daily #90 RF 2   Erika Hunter, MHS, PA-C [facility] [place], [place] [address] [place] 03/15/2014 5:00 PM

## 2014-03-22 ENCOUNTER — Ambulatory Visit (INDEPENDENT_AMBULATORY_CARE_PROVIDER_SITE_OTHER): Payer: Self-pay | Admitting: Physician Assistant

## 2014-03-22 VITALS — BP 100/76 | HR 109 | Temp 99.1°F | Resp 16 | Ht 65.5 in | Wt 137.8 lb

## 2014-03-22 DIAGNOSIS — L089 Local infection of the skin and subcutaneous tissue, unspecified: Secondary | ICD-10-CM

## 2014-03-22 DIAGNOSIS — L723 Sebaceous cyst: Secondary | ICD-10-CM

## 2014-03-22 MED ORDER — CEPHALEXIN 500 MG PO CAPS: 500.0000 mg | ORAL_CAPSULE | Freq: Two times a day (BID) | ORAL | Status: DC

## 2014-03-22 MED ORDER — ALPRAZOLAM 0.5 MG PO TABS: 0.5000 mg | ORAL_TABLET | Freq: Every day | ORAL | Status: DC | PRN

## 2014-03-22 NOTE — Progress Notes (Signed)
   Subjective:    Patient ID: Erika Hunter, female    DOB: 1971/03/13, 43 y.o.   MRN: [id number]  HPI 43 year old female presents for suture removal.  Had sebaceous cyst removed on 03/15/14. Per OV note it had been enlarging in size but was not erythematous or draining.  Patient reports cyst capsule was removed entirely and she has been doing well since then.  She does state that there is still a small "lump" where the cyst was and it continues to be slightly tender to touch. Denies any drainage or warmth.   Patient is also requesting a refill on her Xanax. She takes Xanax 0.5 mg 1/2 tablet daily.  Has been stable on this dose for "years."  Takes Effexor XR which works well.      Review of Systems  Skin: Positive for color change and wound.  Neurological: Negative for dizziness.       Objective:   Physical Exam  Constitutional: She is oriented to person, place, and time. She appears well-developed and well-nourished.  HENT:  Head: Normocephalic and atraumatic.  Right Ear: External ear normal.  Left Ear: External ear normal.  Eyes: Conjunctivae are normal.  Neck: Normal range of motion.  Cardiovascular: Normal rate.   Pulmonary/Chest: Effort normal.  Neurological: She is alert and oriented to person, place, and time.  Skin:     Noted area has small fluctuant area with overlying erythema. Wound is well healing with scab over anterior aspect. No warmth. Minimally TTP.   Psychiatric: She has a normal mood and affect. Her behavior is normal. Judgment and thought content normal.    #2 sutures removed easily. Patient tolerated well. I was able to express a moderate amount of purulent and sebaceous material from wound.  Culture collected.        Assessment & Plan:  Infected sebaceous cyst - Plan: Wound culture  Wound culture pending Start Keflex 500 mg bid x 7 days.  Recommend washing wound daily with warm, soapy water. Recheck in 48 hours if knot re-accumulates or if pain/redness  worsening - sooner if worse.  Refilled Xanax 0.5 mg 1/2 tablet daily. Discussed controlled substance policy. She does intend on establishing with me at [facility] once clinics are opened there.

## 2014-03-26 LAB — WOUND CULTURE
Gram Stain: NONE SEEN
Gram Stain: NONE SEEN

## 2014-04-27 ENCOUNTER — Other Ambulatory Visit: Payer: Self-pay | Admitting: Family Medicine

## 2014-05-06 ENCOUNTER — Other Ambulatory Visit: Payer: Self-pay | Admitting: Physician Assistant

## 2014-05-07 NOTE — Telephone Encounter (Signed)
Faxed

## 2014-06-13 ENCOUNTER — Other Ambulatory Visit: Payer: Self-pay | Admitting: Physician Assistant

## 2014-06-15 NOTE — Telephone Encounter (Signed)
Alprazolam refill phone to pharmacy; they will notify pt that she needs to sch OV for additional refills.

## 2014-06-17 ENCOUNTER — Telehealth: Payer: Self-pay

## 2014-06-17 NOTE — Telephone Encounter (Signed)
Dr. Leward Quan  Patient is requesting her directions for xanax be changed to read take 1/2 to 1 as needed.    [facility] on [address]   [phone]

## 2014-06-17 NOTE — Telephone Encounter (Signed)
Pt can take 1 tablet daily if needed; it is too late to have direction changed. We can discuss her request when she comes in for OV. I will not prescribe any more refills without a visit. I last saw her in Sept 2014.

## 2014-06-21 ENCOUNTER — Telehealth: Payer: Self-pay

## 2014-06-21 NOTE — Telephone Encounter (Signed)
Patient called inquiring about her RX refill. RX was sent to pharmacy on 06/15/14 but they will not fill it because it is too early. Directions say to take 1/2 once daily, which should last 60 days. However, there is a telephone message from Dr Leward Quan that states patient can take 1 tablet daily, which then would last 30 days. Patient has two pills left and says some days she only takes 1/2 and others she takes a whole. Please contact pharmacy and advise

## 2014-06-21 NOTE — Telephone Encounter (Signed)
Advised pt to RTC for further refills. Per last telephone message- pt will need OV for further refills.

## 2014-06-24 ENCOUNTER — Ambulatory Visit (INDEPENDENT_AMBULATORY_CARE_PROVIDER_SITE_OTHER): Payer: Self-pay | Admitting: Family Medicine

## 2014-06-24 ENCOUNTER — Encounter: Payer: Self-pay | Admitting: Family Medicine

## 2014-06-24 VITALS — BP 110/76 | HR 85 | Temp 99.0°F | Resp 16 | Ht 64.5 in | Wt 131.4 lb

## 2014-06-24 DIAGNOSIS — F411 Generalized anxiety disorder: Secondary | ICD-10-CM

## 2014-06-24 MED ORDER — ALPRAZOLAM 0.5 MG PO TABS: ORAL_TABLET | ORAL | Status: DC

## 2014-06-24 NOTE — Progress Notes (Signed)
S:  This 43 y.o. Cauc female has GAD, effectively treated with Alprazolam 0.5 mg tablet prn. Pt takes 1/2 tablet some days and other days requires whole tablet to help with relaxation or sleep. Driving and some other situations cause increased anxiety for pt. She could not get last refill because pharmacy thought pt was requesting refill too early. 30 tablets usually last pt 30 -45 days. She has no adverse effects w/ medication.  Patient Active Problem List   Diagnosis Date Noted  . Hyperlipidemia LDL goal < 100 04/17/2012  . Generalized anxiety disorder 04/17/2012  . Tobacco user 04/17/2012    Current Outpatient Prescriptions on File Prior to Visit  Medication Sig Dispense Refill  . albuterol (PROAIR HFA) 108 (90 BASE) MCG/ACT inhaler Inhale 2 puffs into the lungs every 4 (four) hours as needed. PATIENT NEEDS MED REFILL/CHECK UP FOR ADDITIONAL REFILLS  1 Inhaler  0  . Multiple Vitamins-Minerals (MULTI-VITAMIN GUMMIES PO) Take by mouth daily.      . Omega-3 Fatty Acids (FISH OIL PO) Take by mouth daily.      Marland Kitchen venlafaxine XR (EFFEXOR-XR) 150 MG 24 hr capsule TAKE 1 CAPSULE BY MOUTH ONCE DAILY ALONG WITH 75MG  CAPSULE  90 capsule  2  . venlafaxine XR (EFFEXOR-XR) 75 MG 24 hr capsule TAKE 1 CAPSULE BY MOUTH ONCE DAILY ALONG WITH 150MG  CAPSULE  90 capsule  3   No Known Allergies  History   Social History  . Marital Status: Single    Spouse Name: N/A    Number of Children: N/A  . Years of Education: N/A   Occupational History  . Not on file.   Social History Main Topics  . Smoking status: Current Every Day Smoker -- 1.00 packs/day for 25 years  . Smokeless tobacco: Not on file  . Alcohol Use: Yes  . Drug Use: No  . Sexual Activity: Yes   Other Topics Concern  . Not on file   Social History Narrative  . No narrative on file    O: Filed Vitals:   06/24/14 1529  BP: 110/76  Pulse: 85  Temp: 99 F (37.2 C)  Resp: 16   GEN: In NAD; WN,WD. Appears slightly anxious. HENT:  [place]/AT. EOMI w/ clear conj/sclerae. Otherwise unremarkable. COR: RRR. LUNGS: Normal resp rate and effort. SKIN: W&D; mildy flushed. NEURO: A&O x 3; CNs intact. Nonfocal.  PSYCH: Pleasant and calm, attentive. Speech and thought content normal.  A/P: Generalized anxiety disorder  Meds ordered this encounter  Medications  . ALPRAZolam (XANAX) 0.5 MG tablet    Sig: Take 1 tablet daily by mouth for anxiety.    Dispense:  30 tablet    Refill:  2    Last RF on 05/07/14. OK to fill today; no early refills in the future.

## 2014-07-15 ENCOUNTER — Other Ambulatory Visit: Payer: Self-pay | Admitting: Family Medicine

## 2014-07-15 NOTE — Telephone Encounter (Signed)
Dr Leward Quan, I put note on last RF of albuterol that pt needed OV. She came in to see you this month, but don't see this discussed. OK to give RFs?

## 2014-07-15 NOTE — Telephone Encounter (Signed)
Albuterol MDI refills authorized.

## 2014-08-24 ENCOUNTER — Encounter: Payer: Self-pay | Admitting: Family Medicine

## 2014-08-24 ENCOUNTER — Ambulatory Visit (INDEPENDENT_AMBULATORY_CARE_PROVIDER_SITE_OTHER): Payer: Self-pay | Admitting: Family Medicine

## 2014-08-24 VITALS — BP 110/84 | HR 104 | Temp 98.8°F | Resp 16 | Ht 64.5 in | Wt 129.2 lb

## 2014-08-24 DIAGNOSIS — F32A Depression, unspecified: Secondary | ICD-10-CM

## 2014-08-24 DIAGNOSIS — Z1239 Encounter for other screening for malignant neoplasm of breast: Secondary | ICD-10-CM

## 2014-08-24 DIAGNOSIS — F329 Major depressive disorder, single episode, unspecified: Secondary | ICD-10-CM

## 2014-08-24 DIAGNOSIS — Z0000 Encounter for general adult medical examination without abnormal findings: Secondary | ICD-10-CM

## 2014-08-24 LAB — LIPID PANEL
CHOLESTEROL: 195 mg/dL (ref 0–200)
HDL: 65 mg/dL (ref >39)
LDL Cholesterol: 109 mg/dL — ABNORMAL HIGH (ref 0–99)
TRIGLYCERIDES: 104 mg/dL (ref <150)
Total CHOL/HDL Ratio: 3 Ratio
VLDL: 21 mg/dL (ref 0–40)

## 2014-08-24 LAB — COMPLETE METABOLIC PANEL WITH GFR
ALK PHOS: 81 U/L (ref 39–117)
ALT: 26 U/L (ref 0–35)
AST: 23 U/L (ref 0–37)
Albumin: 4.3 g/dL (ref 3.5–5.2)
BILIRUBIN TOTAL: 0.4 mg/dL (ref 0.2–1.2)
BUN: 8 mg/dL (ref 6–23)
CALCIUM: 9.9 mg/dL (ref 8.4–10.5)
CHLORIDE: 100 meq/L (ref 96–112)
CO2: 26 mEq/L (ref 19–32)
CREATININE: 0.63 mg/dL (ref 0.50–1.10)
GFR, Est African American: >89 mL/min
GFR, Est Non African American: >89 mL/min
Glucose, Bld: 92 mg/dL (ref 70–99)
Potassium: 4.5 mEq/L (ref 3.5–5.3)
Sodium: 136 mEq/L (ref 135–145)
Total Protein: 7.3 g/dL (ref 6.0–8.3)

## 2014-08-24 LAB — POCT URINALYSIS DIPSTICK
BILIRUBIN UA: NEGATIVE
Glucose, UA: NEGATIVE
Ketones, UA: NEGATIVE
LEUKOCYTES UA: NEGATIVE
NITRITE UA: NEGATIVE
PROTEIN UA: NEGATIVE
RBC UA: NEGATIVE
Spec Grav, UA: 1.01
Urobilinogen, UA: 0.2
pH, UA: 6.5

## 2014-08-24 MED ORDER — VENLAFAXINE HCL ER 75 MG PO CP24: ORAL_CAPSULE | ORAL | Status: DC

## 2014-08-24 MED ORDER — VENLAFAXINE HCL ER 150 MG PO CP24: ORAL_CAPSULE | ORAL | Status: DC

## 2014-08-24 MED ORDER — ALPRAZOLAM 0.5 MG PO TABS: ORAL_TABLET | ORAL | Status: DC

## 2014-08-24 NOTE — Progress Notes (Signed)
Subjective:    Patient ID: Erika Hunter, female    DOB: 30-Nov-1970, 43 y.o.   MRN: [id number]  HPI  This 43 y.o. Cauc female is here for CPE and medication refills. She has chronic anxiety and depression, stable and controlled on current medications.  HCM: PAP- 2012 (negative).           MM- > 5 years.                    IMM- Declines Flu vaccine.   Patient Active Problem List   Diagnosis Date Noted  . Hyperlipidemia LDL goal < 100 04/17/2012  . Generalized anxiety disorder 04/17/2012  . Tobacco user 04/17/2012    Prior to Admission medications   Medication Sig Start Date End Date Taking? Authorizing Provider  ALPRAZolam Duanne Moron) 0.5 MG tablet Take 1 tablet daily by mouth for anxiety.   Yes Barton Fanny, MD  Multiple Vitamins-Minerals (MULTI-VITAMIN GUMMIES PO) Take by mouth daily.   Yes Historical Provider, MD  Omega-3 Fatty Acids (FISH OIL PO) Take by mouth daily.   Yes Historical Provider, MD  PROAIR HFA 108 (90 BASE) MCG/ACT inhaler INHALE TWO PUFFS INTO LUNGS EVERY 4 HOURS AS NEEDED 07/15/14  Yes Barton Fanny, MD  venlafaxine XR (EFFEXOR-XR) 150 MG 24 hr capsule TAKE 1 CAPSULE BY MOUTH ONCE DAILY ALONG WITH 75MG  CAPSULE   Yes Barton Fanny, MD  venlafaxine XR (EFFEXOR-XR) 75 MG 24 hr capsule TAKE 1 CAPSULE BY MOUTH ONCE DAILY ALONG WITH 150MG  CAPSULE   Yes Barton Fanny, MD    History   Social History  . Marital Status: Single    Spouse Name: N/A    Number of Children: N/A  . Years of Education: N/A   Occupational History  . clerk    Social History Main Topics  . Smoking status: Current Every Day Smoker -- 1.00 packs/day for 25 years  . Smokeless tobacco: Not on file  . Alcohol Use: Yes     Comment: wine  . Drug Use: No  . Sexual Activity: Yes   Other Topics Concern  . Not on file   Social History Narrative  . No narrative on file    Family History  Problem Relation Age of Onset  . COPD Mother   . COPD Sister     Review of  Systems  Constitutional: Negative.   HENT: Negative.   Eyes: Positive for photophobia.       No recent vision eval by eye care professional; wears corrective lenses.  Respiratory: Negative.   Cardiovascular: Negative.   Gastrointestinal: Negative.   Endocrine: Negative.   Genitourinary: Negative.   Musculoskeletal: Negative.   Skin: Negative.   Allergic/Immunologic: Negative.   Neurological: Negative.   Hematological: Negative.   Psychiatric/Behavioral: Negative.        Objective:   Physical Exam  Nursing note and vitals reviewed. Constitutional: She is oriented to person, place, and time. Vital signs are normal. She appears well-developed and well-nourished. No distress.  HENT:  Head: Normocephalic and atraumatic.  Right Ear: Hearing, tympanic membrane, external ear and ear canal normal.  Left Ear: Hearing, tympanic membrane, external ear and ear canal normal.  Nose: Nose normal. No nasal deformity or septal deviation.  Mouth/Throat: Uvula is midline, oropharynx is clear and moist and mucous membranes are normal. No oral lesions. Normal dentition. No dental caries.  Eyes: Conjunctivae, EOM and lids are normal. Pupils are equal, round, and reactive to light.  No scleral icterus.  Fundoscopic exam:      The right eye shows no arteriolar narrowing, no AV nicking and no papilledema. The right eye shows red reflex.       The left eye shows no arteriolar narrowing, no AV nicking and no papilledema. The left eye shows red reflex.  Neck: Trachea normal, normal range of motion, full passive range of motion without pain and phonation normal. Neck supple. No spinous process tenderness and no muscular tenderness present. No mass and no thyromegaly present.  Cardiovascular: Normal rate, regular rhythm, S1 normal, normal heart sounds and normal pulses.   No extrasystoles are present. PMI is not displaced.  Exam reveals no gallop.   No murmur heard. Pulmonary/Chest: Effort normal and breath  sounds normal. No respiratory distress. She has no decreased breath sounds. She has no wheezes. Right breast exhibits no inverted nipple, no mass, no nipple discharge, no skin change and no tenderness. Left breast exhibits no inverted nipple, no mass, no nipple discharge, no skin change and no tenderness. Breasts are symmetrical.  Abdominal: Soft. Normal appearance, normal aorta and bowel sounds are normal. She exhibits no distension and no mass. There is no hepatosplenomegaly. There is no tenderness. There is no guarding and no CVA tenderness.  Genitourinary:  Deferred.  Musculoskeletal:       Cervical back: Normal.       Thoracic back: Normal.       Lumbar back: Normal.  Remainder of exam unremarkable.  Lymphadenopathy:       Head (right side): No submental, no submandibular, no tonsillar, no preauricular and no posterior auricular adenopathy present.       Head (left side): No submental, no submandibular, no tonsillar, no preauricular, no posterior auricular and no occipital adenopathy present.    She has no cervical adenopathy.    She has no axillary adenopathy.       Right: No inguinal and no supraclavicular adenopathy present.       Left: No inguinal and no supraclavicular adenopathy present.  Neurological: She is alert and oriented to person, place, and time. She has normal strength and normal reflexes. She displays no atrophy. No cranial nerve deficit or sensory deficit. She exhibits normal muscle tone. She displays a negative Romberg sign. Coordination and gait normal.  Skin: Skin is warm, dry and intact. No ecchymosis, no lesion and no rash noted. She is not diaphoretic. No cyanosis or erythema. No pallor. Nails show no clubbing.  Psychiatric: She has a normal mood and affect. Her speech is normal and behavior is normal. Judgment and thought content normal. Cognition and memory are normal.    Results for orders placed in visit on 08/24/14  POCT URINALYSIS DIPSTICK      Result Value  Ref Range   Color, UA yellow     Clarity, UA clear     Glucose, UA neg     Bilirubin, UA neg     Ketones, UA neg     Spec Grav, UA 1.010     Blood, UA neg     pH, UA 6.5     Protein, UA neg     Urobilinogen, UA 0.2     Nitrite, UA neg     Leukocytes, UA Negative         Assessment & Plan:  Laboratory examination ordered as part of a routine general medical examination - Plan: POCT urinalysis dipstick, COMPLETE METABOLIC PANEL WITH GFR, Lipid panel  Depression - Plan: venlafaxine XR (  EFFEXOR-XR) 150 MG 24 hr capsule  Screening for breast cancer - Plan: MM Digital Screening  Pt will return for PAP/pelvic when she is financially able (she is self-pay).  Meds ordered this encounter  Medications  . ALPRAZolam (XANAX) 0.5 MG tablet    Sig: Take 1 tablet daily by mouth for anxiety.    Dispense:  30 tablet    Refill:  2    30 tablets must last 30 days; no early refills.  . venlafaxine XR (EFFEXOR-XR) 150 MG 24 hr capsule    Sig: TAKE 1 CAPSULE BY MOUTH ONCE DAILY ALONG WITH 75MG  CAPSULE    Dispense:  90 capsule    Refill:  3  . venlafaxine XR (EFFEXOR-XR) 75 MG 24 hr capsule    Sig: TAKE 1 CAPSULE BY MOUTH ONCE DAILY ALONG WITH 150MG  CAPSULE    Dispense:  90 capsule    Refill:  3

## 2014-08-24 NOTE — Patient Instructions (Signed)
Keeping You Healthy  Get These Tests 1. Blood Pressure- Have your blood pressure checked once a year by your health care provider.  Normal blood pressure is 120/80. 2. Weight- Have your body mass index (BMI) calculated to screen for obesity.  BMI is measure of body fat based on height and weight.  You can also calculate your own BMI at GravelBags.it. 3. Cholesterol- Have your cholesterol checked every 5 years starting at age 45 then yearly starting at age 27. 38. Chlamydia, HIV, and other sexually transmitted diseases- Get screened every year until age 24, then within three months of each new sexual provider. 5. Pap Smear- Every 1-3 years; discuss with your health care provider. Schedule this exam next year when finances allow. 6. Mammogram- Every year starting at age 45  Take these medicines  Calcium with Vitamin D-Your body needs 1200 mg of Calcium each day and [phone] IU of Vitamin D daily.  Your body can only absorb 500 mg of Calcium at a time so Calcium must be taken in 2 or 3 divided doses throughout the day.  Multivitamin with folic acid- Once daily if it is possible for you to become pregnant.  Get these Immunizations  Gardasil-Series of three doses; prevents HPV related illness such as genital warts and cervical cancer.  Menactra-Single dose; prevents meningitis.  Tetanus shot- Every 10 years.  Flu shot-Every year.  Take these steps 1. Do not smoke-Your healthcare provider can help you quit.  For tips on how to quit go to www.smokefree.gov or call 1-800 QUITNOW. 2. Be physically active- Exercise 5 days a week for at least 30 minutes.  If you are not already physically active, start slow and gradually work up to 30 minutes of moderate physical activity.  Examples of moderate activity include walking briskly, dancing, swimming, bicycling, etc. 3. Breast Cancer- A self breast exam every month is important for early detection of breast cancer.  For more information and  instruction on self breast exams, ask your healthcare provider or https://www.patel.info/. 4. Eat a healthy diet- Eat a variety of healthy foods such as fruits, vegetables, whole grains, low fat milk, low fat cheeses, yogurt, lean meats, poultry and fish, beans, nuts, tofu, etc.  For more information go to www. Thenutritionsource.org 5. Drink alcohol in moderation- Limit alcohol intake to one drink or less per day. Never drink and drive. 6. Depression- Your emotional health is as important as your physical health.  If you're feeling down or losing interest in things you normally enjoy please talk to your healthcare provider about being screened for depression. 7. Dental visit- Brush and floss your teeth twice daily; visit your dentist twice a year. 8. Eye doctor- Get an eye exam at least every 2 years. 9. Helmet use- Always wear a helmet when riding a bicycle, motorcycle, rollerblading or skateboarding. 65. Safe sex- If you may be exposed to sexually transmitted infections, use a condom. 11. Seat belts- Seat belts can save your live; always wear one. 12. Smoke/Carbon Monoxide detectors- These detectors need to be installed on the appropriate level of your home. Replace batteries at least once a year. 13. Skin cancer- When out in the sun please cover up and use sunscreen 15 SPF or higher. 14. Violence- If anyone is threatening or hurting you, please tell your healthcare provider.        Mediterranean Diet  Why follow it? Research shows.   Those who follow the Mediterranean diet have a reduced risk of heart disease  The diet is associated with a reduced incidence of Parkinson's and Alzheimer's diseases   People following the diet may have longer life expectancies and lower rates of chronic diseases    The Dietary Guidelines for Americans recommends the Mediterranean diet as an eating plan to promote health and prevent disease  What Is the Mediterranean Diet?    Healthy  eating plan based on typical foods and recipes of Mediterranean-style cooking   The diet is primarily a plant based diet; these foods should make up a majority of meals   Starches - Plant based foods should make up a majority of meals - They are an important sources of vitamins, minerals, energy, antioxidants, and fiber - Choose whole grains, foods high in fiber and minimally processed items  - Typical grain sources include wheat, oats, barley, corn, brown rice, bulgar, farro, millet, polenta, couscous  - Various types of beans include chickpeas, lentils, fava beans, black beans, white beans   Fruits  Veggies - Large quantities of antioxidant rich fruits & veggies; 6 or more servings  - Vegetables can be eaten raw or lightly drizzled with oil and cooked  - Vegetables common to the traditional Mediterranean Diet include: artichokes, arugula, beets, broccoli, brussel sprouts, cabbage, carrots, celery, collard greens, cucumbers, eggplant, kale, leeks, lemons, lettuce, mushrooms, okra, onions, peas, peppers, potatoes, pumpkin, radishes, rutabaga, shallots, spinach, sweet potatoes, turnips, zucchini - Fruits common to the Mediterranean Diet include: apples, apricots, avocados, cherries, clementines, dates, figs, grapefruits, grapes, melons, nectarines, oranges, peaches, pears, pomegranates, strawberries, tangerines  Fats - Replace butter and margarine with healthy oils, such as olive oil, canola oil, and tahini  - Limit nuts to no more than a handful a day  - Nuts include walnuts, almonds, pecans, pistachios, pine nuts  - Limit or avoid candied, honey roasted or heavily salted nuts - Olives are central to the [employer] - can be eaten whole or used in a variety of dishes   Meats Protein - Limiting red meat: no more than a few times a month - When eating red meat: choose lean cuts and keep the portion to the size of deck of cards - Eggs: approx. 0 to 4 times a week  - Fish and lean poultry: at  least 2 a week  - Healthy protein sources include, chicken, [place], lean beef, lamb - Increase intake of seafood such as tuna, salmon, trout, mackerel, shrimp, scallops - Avoid or limit high fat processed meats such as sausage and bacon  Dairy - Include moderate amounts of low fat dairy products  - Focus on healthy dairy such as fat free yogurt, skim milk, low or reduced fat cheese - Limit dairy products higher in fat such as whole or 2% milk, cheese, ice cream  Alcohol - Moderate amounts of red wine is ok  - No more than 5 oz daily for women (all ages) and men older than age 88  - No more than 10 oz of wine daily for men younger than 33  Other - Limit sweets and other desserts  - Use herbs and spices instead of salt to flavor foods  - Herbs and spices common to the traditional Mediterranean Diet include: basil, bay leaves, chives, cloves, cumin, fennel, garlic, lavender, marjoram, mint, oregano, parsley, pepper, rosemary, sage, savory, sumac, tarragon, thyme   It's not just a diet, it's a lifestyle:    The Mediterranean diet includes lifestyle factors typical of those in the region    Foods, drinks and meals  are best eaten with others and savored   Daily physical activity is important for overall good health   This could be strenuous exercise like running and aerobics   This could also be more leisurely activities such as walking, housework, yard-work, or taking the stairs   Moderation is the key; a balanced and healthy diet accommodates most foods and drinks   Consider portion sizes and frequency of consumption of certain foods   Meal Ideas & Options:    Breakfast:  o Whole wheat toast or whole wheat English muffins with peanut butter & hard boiled egg o Steel cut oats topped with apples & cinnamon and skim milk  o Fresh fruit: banana, strawberries, melon, berries, peaches  o Smoothies: strawberries, bananas, greek yogurt, peanut butter o Low fat greek yogurt with blueberries and  granola  o Egg white omelet with spinach and mushrooms o Breakfast couscous: whole wheat couscous, apricots, skim milk, cranberries    Sandwiches:  o Hummus and grilled vegetables (peppers, zucchini, squash) on whole wheat bread   o Grilled chicken on whole wheat pita with lettuce, tomatoes, cucumbers or tzatziki  o Tuna salad on whole wheat bread: tuna salad made with greek yogurt, olives, red peppers, capers, green onions o Garlic rosemary lamb pita: lamb sauted with garlic, rosemary, salt & pepper; add lettuce, cucumber, greek yogurt to pita - flavor with lemon juice and black pepper    Seafood:  o Mediterranean grilled salmon, seasoned with garlic, basil, parsley, lemon juice and black pepper o Shrimp, lemon, and spinach whole-grain pasta salad made with low fat greek yogurt  o Seared scallops with lemon orzo  o Seared tuna steaks seasoned salt, pepper, coriander topped with tomato mixture of olives, tomatoes, olive oil, minced garlic, parsley, green onions and cappers    Meats:  o Herbed greek chicken salad with kalamata olives, cucumber, feta  o Red bell peppers stuffed with spinach, bulgur, lean ground beef (or lentils) & topped with feta   o Kebabs: skewers of chicken, tomatoes, onions, zucchini, squash  o [place] burgers: made with red onions, mint, dill, lemon juice, feta cheese topped with roasted red peppers   Vegetarian o Cucumber salad: cucumbers, artichoke hearts, celery, red onion, feta cheese, tossed in olive oil & lemon juice  o Hummus and whole grain pita points with a greek salad (lettuce, tomato, feta, olives, cucumbers, red onion) o Lentil soup with celery, carrots made with vegetable broth, garlic, salt and pepper  o Tabouli salad: parsley, bulgur, mint, scallions, cucumbers, tomato, radishes, lemon juice, olive oil, salt and pepper. o

## 2014-08-28 NOTE — Progress Notes (Signed)
Quick Note:  Please notify pt that results are normal.   Provide pt with copy of labs. ______ 

## 2014-08-29 ENCOUNTER — Encounter: Payer: Self-pay | Admitting: *Deleted

## 2014-09-06 ENCOUNTER — Telehealth: Payer: Self-pay

## 2014-09-06 DIAGNOSIS — F32A Depression, unspecified: Secondary | ICD-10-CM

## 2014-09-06 DIAGNOSIS — F329 Major depressive disorder, single episode, unspecified: Secondary | ICD-10-CM

## 2014-09-06 MED ORDER — VENLAFAXINE HCL ER 75 MG PO CP24: ORAL_CAPSULE | ORAL | Status: DC

## 2014-09-06 MED ORDER — VENLAFAXINE HCL ER 150 MG PO CP24: ORAL_CAPSULE | ORAL | Status: DC

## 2014-09-06 NOTE — Telephone Encounter (Signed)
Cancelled scripts sent to [facility] and resent to [facility]. Pt notified.

## 2014-09-06 NOTE — Telephone Encounter (Signed)
Patient is requesting that we fix  Where we sent her medications. She had requested two meds go to [facility] on [address]- due to cost it was cheaper- they were both Efexor 150 and 75. Please correct this thank you! She last saw Dr. Leward Quan in 10/06  Call patient when done: [phone]

## 2014-09-20 ENCOUNTER — Encounter: Payer: Self-pay | Admitting: Family Medicine

## 2015-01-21 ENCOUNTER — Other Ambulatory Visit: Payer: Self-pay | Admitting: Family Medicine

## 2015-01-21 NOTE — Telephone Encounter (Signed)
Alprazolam phoned to pt's pharmacy. She will need OV for additional refills.

## 2015-03-28 ENCOUNTER — Other Ambulatory Visit: Payer: Self-pay | Admitting: Family Medicine

## 2015-03-29 NOTE — Telephone Encounter (Signed)
Alprazolam refill phoned to pt's pharmacy. 

## 2015-06-20 ENCOUNTER — Telehealth: Payer: Self-pay

## 2015-06-20 MED ORDER — ALBUTEROL SULFATE 108 (90 BASE) MCG/ACT IN AEPB
2.0000 | INHALATION_SPRAY | RESPIRATORY_TRACT | Status: DC | PRN
Start: 2015-06-20 — End: 2016-01-16

## 2015-06-20 NOTE — Telephone Encounter (Signed)
Patient would like a medication refill for respiclick. She received a coupon for from a representative. She states that it's the same as pro air. Pharmacy is on [address] on [address]. Patient phone: [phone]

## 2015-06-20 NOTE — Telephone Encounter (Signed)
Meds ordered this encounter  Medications  . Albuterol Sulfate (PROAIR RESPICLICK) 416 (90 BASE) MCG/ACT AEPB    Sig: Inhale 2 puffs into the lungs every 4 (four) hours as needed (cough, shortness of breath, wheezing).    Dispense:  1 each    Refill:  3    Order Specific Question:  Supervising Provider    Answer:  DOOLITTLE, ROBERT P [[id number]]

## 2015-06-20 NOTE — Telephone Encounter (Signed)
Spoke with pt, advised Rx was sent in. 

## 2015-06-22 ENCOUNTER — Ambulatory Visit (INDEPENDENT_AMBULATORY_CARE_PROVIDER_SITE_OTHER): Payer: Self-pay | Admitting: Family Medicine

## 2015-06-22 ENCOUNTER — Encounter: Payer: Self-pay | Admitting: Family Medicine

## 2015-06-22 VITALS — BP 106/76 | HR 103 | Temp 98.1°F | Resp 16 | Ht 64.5 in | Wt 127.6 lb

## 2015-06-22 DIAGNOSIS — F419 Anxiety disorder, unspecified: Secondary | ICD-10-CM

## 2015-06-22 DIAGNOSIS — Z124 Encounter for screening for malignant neoplasm of cervix: Secondary | ICD-10-CM

## 2015-06-22 DIAGNOSIS — F329 Major depressive disorder, single episode, unspecified: Secondary | ICD-10-CM

## 2015-06-22 DIAGNOSIS — F32A Depression, unspecified: Secondary | ICD-10-CM

## 2015-06-22 DIAGNOSIS — F418 Other specified anxiety disorders: Secondary | ICD-10-CM

## 2015-06-22 MED ORDER — VENLAFAXINE HCL ER 75 MG PO CP24: ORAL_CAPSULE | ORAL | Status: DC

## 2015-06-22 MED ORDER — ALPRAZOLAM 0.5 MG PO TABS: ORAL_TABLET | ORAL | Status: DC

## 2015-06-22 MED ORDER — VENLAFAXINE HCL ER 150 MG PO CP24: ORAL_CAPSULE | ORAL | Status: DC

## 2015-06-22 NOTE — Patient Instructions (Signed)
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
## 2015-06-23 LAB — PAP IG W/ RFLX HPV ASCU

## 2015-12-09 ENCOUNTER — Other Ambulatory Visit: Payer: Self-pay

## 2015-12-09 DIAGNOSIS — F329 Major depressive disorder, single episode, unspecified: Secondary | ICD-10-CM

## 2015-12-09 DIAGNOSIS — F32A Depression, unspecified: Secondary | ICD-10-CM

## 2015-12-09 MED ORDER — VENLAFAXINE HCL ER 150 MG PO CP24: ORAL_CAPSULE | ORAL | Status: DC

## 2015-12-09 MED ORDER — VENLAFAXINE HCL ER 75 MG PO CP24: ORAL_CAPSULE | ORAL | Status: DC

## 2016-01-11 ENCOUNTER — Other Ambulatory Visit: Payer: Self-pay | Admitting: Family Medicine

## 2016-01-15 NOTE — Telephone Encounter (Signed)
Please call in refill of Xanax as approved. 

## 2016-01-16 ENCOUNTER — Other Ambulatory Visit: Payer: Self-pay

## 2016-01-16 MED ORDER — ALBUTEROL SULFATE 108 (90 BASE) MCG/ACT IN AEPB: 2.0000 | INHALATION_SPRAY | RESPIRATORY_TRACT | Status: DC | PRN

## 2016-01-16 NOTE — Telephone Encounter (Signed)
Faxed

## 2016-04-20 ENCOUNTER — Other Ambulatory Visit: Payer: Self-pay | Admitting: Family Medicine

## 2016-04-21 ENCOUNTER — Other Ambulatory Visit: Payer: Self-pay | Admitting: Family Medicine

## 2016-04-23 NOTE — Telephone Encounter (Signed)
Does patient need to RTC she hasn't been seen since 8/16

## 2016-04-23 NOTE — Telephone Encounter (Signed)
Duplicate. req from 6/2 pending for Dr Tamala Julian.

## 2016-04-24 ENCOUNTER — Other Ambulatory Visit: Payer: Self-pay | Admitting: Family Medicine

## 2016-04-24 ENCOUNTER — Telehealth: Payer: Self-pay | Admitting: *Deleted

## 2016-04-24 MED ORDER — ALBUTEROL SULFATE HFA 108 (90 BASE) MCG/ACT IN AERS: 2.0000 | INHALATION_SPRAY | Freq: Four times a day (QID) | RESPIRATORY_TRACT | Status: DC | PRN

## 2016-04-24 NOTE — Telephone Encounter (Signed)
Called in.

## 2016-04-24 NOTE — Telephone Encounter (Signed)
Please call or fax in refill of Xanax.

## 2016-04-24 NOTE — Telephone Encounter (Signed)
Called patient to advise Rx for Xanax was faxed to pharmacy. Patient wants Rx sent to [facility] on [address].

## 2016-07-28 ENCOUNTER — Other Ambulatory Visit: Payer: Self-pay | Admitting: Family Medicine

## 2016-07-30 ENCOUNTER — Telehealth: Payer: Self-pay

## 2016-07-30 ENCOUNTER — Other Ambulatory Visit: Payer: Self-pay | Admitting: Family Medicine

## 2016-07-30 NOTE — Telephone Encounter (Signed)
Pharmacy called in two refills and no response. Patient need a refill of Proair inhaler. [facility] on [address] Patient request for the regular inhaler not the one that clicks.

## 2016-07-31 NOTE — Telephone Encounter (Signed)
Rx already called in.

## 2016-08-09 ENCOUNTER — Other Ambulatory Visit: Payer: Self-pay | Admitting: Family Medicine

## 2016-08-11 NOTE — Telephone Encounter (Signed)
Patient hasn't been recently, do you want her to RTC?

## 2016-08-13 NOTE — Telephone Encounter (Signed)
Refill of Xanax denied.  Last OV 06/2015; overdue for follow-up.  Please advise patient.

## 2016-08-14 ENCOUNTER — Telehealth: Payer: Self-pay

## 2016-08-14 NOTE — Telephone Encounter (Signed)
[facility] on [address] needs clarification on Prescription Metformin.

## 2016-08-14 NOTE — Telephone Encounter (Signed)
LMOM for pt that she needs OV and can call for appt w/in the week.

## 2016-08-21 ENCOUNTER — Other Ambulatory Visit: Payer: Self-pay

## 2016-08-21 NOTE — Telephone Encounter (Signed)
Patient is calling because her request for xanax was denied. Patient states that she has an appointment for a CPE scheduled with Dr. Tamala Julian next month and wants to know if she can get enough to last until then. Patient states that she can schedule an appointment for a refill but will have to cancel the CPE appointment. Please advise! [phone]

## 2016-08-22 MED ORDER — ALPRAZOLAM 0.5 MG PO TABS: ORAL_TABLET | ORAL | 0 refills | Status: DC

## 2016-08-22 NOTE — Telephone Encounter (Signed)
Patient last seen 06/2015. Last refill 08/09/16 denied by Dr Tamala Julian. Has appointment scheduled 09/11/16 with Chelle, will you give #20 to get her through until appointment date?

## 2016-08-22 NOTE — Telephone Encounter (Signed)
Meds ordered this encounter  Medications  . ALPRAZolam (XANAX) 0.5 MG tablet    Sig: TAKE ONE TABLET BY MOUTH ONCE DAILY FOR ANXIETY AS DIRECTED    Dispense:  20 tablet    Refill:  0

## 2016-08-22 NOTE — Telephone Encounter (Signed)
Voice message left for pt to pick up prescription.

## 2016-09-11 ENCOUNTER — Encounter: Payer: Self-pay | Admitting: Physician Assistant

## 2016-09-11 ENCOUNTER — Ambulatory Visit (INDEPENDENT_AMBULATORY_CARE_PROVIDER_SITE_OTHER): Payer: Self-pay | Admitting: Physician Assistant

## 2016-09-11 VITALS — BP 120/70 | HR 84 | Temp 98.2°F | Resp 16 | Ht 64.5 in | Wt 110.8 lb

## 2016-09-11 DIAGNOSIS — Z720 Tobacco use: Secondary | ICD-10-CM

## 2016-09-11 DIAGNOSIS — Z8742 Personal history of other diseases of the female genital tract: Secondary | ICD-10-CM | POA: Insufficient documentation

## 2016-09-11 DIAGNOSIS — F32A Depression, unspecified: Secondary | ICD-10-CM | POA: Insufficient documentation

## 2016-09-11 DIAGNOSIS — F411 Generalized anxiety disorder: Secondary | ICD-10-CM

## 2016-09-11 DIAGNOSIS — E785 Hyperlipidemia, unspecified: Secondary | ICD-10-CM

## 2016-09-11 DIAGNOSIS — F329 Major depressive disorder, single episode, unspecified: Secondary | ICD-10-CM

## 2016-09-11 DIAGNOSIS — Z87898 Personal history of other specified conditions: Secondary | ICD-10-CM

## 2016-09-11 MED ORDER — ALPRAZOLAM 0.5 MG PO TABS: ORAL_TABLET | ORAL | 0 refills | Status: DC

## 2016-09-11 MED ORDER — VENLAFAXINE HCL ER 75 MG PO CP24: ORAL_CAPSULE | ORAL | 3 refills | Status: AC

## 2016-09-11 MED ORDER — VENLAFAXINE HCL ER 150 MG PO CP24: ORAL_CAPSULE | ORAL | 3 refills | Status: DC

## 2016-09-11 MED ORDER — VENLAFAXINE HCL ER 75 MG PO CP24: ORAL_CAPSULE | ORAL | 3 refills | Status: DC

## 2016-09-11 MED ORDER — VENLAFAXINE HCL ER 150 MG PO CP24: ORAL_CAPSULE | ORAL | 3 refills | Status: AC

## 2016-09-11 MED ORDER — ALBUTEROL SULFATE HFA 108 (90 BASE) MCG/ACT IN AERS: INHALATION_SPRAY | RESPIRATORY_TRACT | 1 refills | Status: DC

## 2016-09-11 NOTE — Progress Notes (Signed)
Subjective:    Patient ID: Erika Hunter, female    DOB: [birth year], 45 y.o.   MRN: [id number]  PCP: Reginia Forts, MD  Chief Complaint  Patient presents with  . Medication Refill    Pro Air Inhaler, Alprazolam 0.5 mg, Venlafaxine HCI Effexor 75 mg and 150 mg,    HPI Presents for Medication refill. She scheduled for a wellness exam, but as she is declining all the screenings, we elected to change the visit to a routine follow-up.  Not insured. Declined all unnecessary testing, screening, vaccination. 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  Generally feels well. Some aches and pains, but nothing that she wants evaluated today. Continues to smoke. Not ready to quit.  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 After a couple of weeks, she had too much back pain and went back to her previous job. She did get a flu shot while at [employer].      Review of Systems  Constitutional: Negative.   HENT: Positive for hearing loss.   Eyes: Positive for photophobia and visual disturbance.  Respiratory: Positive for chest tightness and shortness of breath.   Cardiovascular: Negative.   Gastrointestinal: Negative.   Endocrine: Negative.   Genitourinary: Negative.   Musculoskeletal: Positive for back pain.  Allergic/Immunologic: Negative.   Neurological: Negative.   Hematological: Negative.   Psychiatric/Behavioral: Negative.        Objective:   Physical Exam  Constitutional: She is oriented to person, place, and time. She appears well-developed and well-nourished. She is active and cooperative. No distress.  BP 120/70   Pulse 84   Temp 98.2 F (36.8 C) (Oral)   Resp 16   Ht 5' 4.5" (1.638 m)   Wt 110 lb 12.8 oz (50.3 kg)   LMP 05/01/2012   SpO2 99%   BMI 18.73 kg/m   HENT:  Head: Normocephalic and atraumatic.  Right Ear: Hearing normal.  Left Ear: Hearing normal.  Eyes:  Conjunctivae are normal. No scleral icterus.  Neck: Normal range of motion. Neck supple. No thyromegaly present.  Cardiovascular: Normal rate, regular rhythm and normal heart sounds.   Pulses:      Radial pulses are 2+ on the right side, and 2+ on the left side.  Pulmonary/Chest: Effort normal and breath sounds normal.  Lymphadenopathy:       Head (right side): No tonsillar, no preauricular, no posterior auricular and no occipital adenopathy present.       Head (left side): No tonsillar, no preauricular, no posterior auricular and no occipital adenopathy present.    She has no cervical adenopathy.       Right: No supraclavicular adenopathy present.       Left: No supraclavicular adenopathy present.  Neurological: She is alert and oriented to person, place, and time. No sensory deficit.  Skin: Skin is warm, dry and intact. No rash noted. No cyanosis or erythema. Nails show no clubbing.  Psychiatric: She has a normal mood and affect. Her speech is normal and behavior is normal.          Assessment & Plan:   1. Generalized anxiety disorder Stable. May call for refills from Dr. Tamala Julian. - ALPRAZolam Duanne Moron) 0.5 MG tablet; TAKE ONE TABLET BY MOUTH ONCE DAILY FOR ANXIETY AS DIRECTED  Dispense: 30 tablet; Refill: 0  2. Depression, unspecified depression type Controlled. Continue current  treatment. - venlafaxine XR (EFFEXOR-XR) 75 MG 24 hr capsule; TAKE 1 CAPSULE BY MOUTH ONCE DAILY ALONG WITH '150MG'$  CAPSULE  Dispense: 90 capsule; Refill: 3 - venlafaxine XR (EFFEXOR-XR) 150 MG 24 hr capsule; TAKE 1 CAPSULE BY MOUTH ONCE DAILY ALONG WITH '75MG'$  CAPSULE  Dispense: 90 capsule; Refill: 3  3. Hyperlipidemia with target low density lipoprotein (LDL) cholesterol less than 100 mg/dL Continue OTC fish oil supplement.  4. Tobacco user Encouraged smoking cessation. - albuterol (PROAIR HFA) 108 (90 Base) MCG/ACT inhaler; INHALE 2 PUFFS INTO LUNGS EVERY 4 HOURS AS NEEDED FOR COUGH, SHORTNESS OF BREATH, OR  WHEEZING  Dispense: 9 each; Refill: 1  5. History of abnormal cervical Pap smear She plans repeat pap next year.   Fara Chute, PA-C Physician Assistant-Certified [facility]

## 2016-09-11 NOTE — Patient Instructions (Addendum)
     IF you received an x-ray today, you will receive an invoice from [facility]. Please contact [facility] at [phone] with questions or concerns regarding your invoice.   IF you received labwork today, you will receive an invoice from [facility]. Please contact [facility] at [phone] with questions or concerns regarding your invoice.   Our billing staff will not be able to assist you with questions regarding bills from these companies.  You will be contacted with the lab results as soon as they are available. The fastest way to get your results is to activate your My Chart account. Instructions are located on the last page of this paperwork. If you have not heard from [place] regarding the results in 2 weeks, please contact this office.    Did you know that you begin to benefit from quitting smoking within the first twenty minutes? It's TRUE.  At 20 minutes: -blood pressure decreases -pulse rate drops -body temperature of hands and feet increases  At 8 hours: -carbon monoxide level in blood drops to normal -oxygen level in blood increases to normal  At 24 hours: -the chance of heart attack decreases  At 48 hours: -nerve endings start regrowing -ability to smell and taste is enhanced  2 weeks-3 months: -circulation improves -walking becomes easier -lung function improves  1-9 months: -coughing, sinus congestion, fatigue and shortness of breath decreases  1 year: -excess risk of heart disease is decreased to HALF that of a smoker  5 years: Stroke risk is reduced to that of people who have never smoked  10 years: -risk of lung cancer drops to as little as half that of continuing smokers -risk of cancer of the mouth, throat, esophagus, bladder, kidney and pancreas decreases -risk of ulcer decreases  15 years -risk of heart disease is now similar to that of people who have never smoked -risk of death returns to nearly the  level of people who have never smoked

## 2016-10-15 ENCOUNTER — Other Ambulatory Visit: Payer: Self-pay | Admitting: Physician Assistant

## 2016-10-15 DIAGNOSIS — F411 Generalized anxiety disorder: Secondary | ICD-10-CM

## 2016-10-17 ENCOUNTER — Other Ambulatory Visit: Payer: Self-pay | Admitting: Physician Assistant

## 2016-10-17 DIAGNOSIS — F411 Generalized anxiety disorder: Secondary | ICD-10-CM

## 2016-10-17 NOTE — Telephone Encounter (Signed)
Meds ordered this encounter  Medications  . ALPRAZolam (XANAX) 0.5 MG tablet    Sig: TAKE ONE TABLET BY MOUTH ONCE DAILY FOR ANXIETY AS DIRECTED    Dispense:  30 tablet    Refill:  0

## 2016-10-17 NOTE — Telephone Encounter (Signed)
LMVM for patient to call back with pharmacy preference as there are three pharmacies in her chart.  RX is nurses box on the wall.

## 2016-10-18 NOTE — Telephone Encounter (Signed)
Faxed to [facility] [address] since that was the pharm who req'd it.

## 2016-11-21 ENCOUNTER — Other Ambulatory Visit: Payer: Self-pay | Admitting: Physician Assistant

## 2016-11-21 DIAGNOSIS — F411 Generalized anxiety disorder: Secondary | ICD-10-CM

## 2016-11-23 NOTE — Telephone Encounter (Signed)
Meds ordered this encounter  Medications  . ALPRAZolam (XANAX) 0.5 MG tablet    Sig: TAKE ONE TABLET BY MOUTH ONCE DAILY FOR ANXIETY AS DIRECTED    Dispense:  30 tablet    Refill:  0    I realized that when I saw this patient in 08/2016, I noted that Dr. Tamala Julian is her PCP and that refills should come from her.  Let's try to get future refills to Dr. Tamala Julian.

## 2016-11-24 NOTE — Telephone Encounter (Signed)
Faxed to [place] [facility]

## 2016-12-18 ENCOUNTER — Other Ambulatory Visit: Payer: Self-pay | Admitting: Physician Assistant

## 2016-12-18 DIAGNOSIS — Z720 Tobacco use: Secondary | ICD-10-CM

## 2016-12-18 DIAGNOSIS — F411 Generalized anxiety disorder: Secondary | ICD-10-CM

## 2016-12-19 NOTE — Telephone Encounter (Signed)
As noted when I refilled this last time, this patient sees Dr. Tamala Julian. I filled it at a visit in 08/2016 in Dr. Thompson Caul absence, and so subsequent refill requests have come to me. Routing to Dr. Tamala Julian.

## 2016-12-21 MED ORDER — ALBUTEROL SULFATE HFA 108 (90 BASE) MCG/ACT IN AERS: INHALATION_SPRAY | RESPIRATORY_TRACT | 1 refills | Status: AC

## 2016-12-21 NOTE — Addendum Note (Signed)
Addended by: Virgia Land on: 12/21/2016 05:23 PM   Modules accepted: Orders

## 2016-12-21 NOTE — Telephone Encounter (Signed)
Rx called into pharmacy/[facility].

## 2016-12-21 NOTE — Telephone Encounter (Signed)
Alprazolam called in

## 2017-01-18 ENCOUNTER — Inpatient Hospital Stay (HOSPITAL_COMMUNITY)
Admission: EM | Admit: 2017-01-18 | Discharge: 2017-01-31 | DRG: 180 | Disposition: A | Discharge status: Skilled Nursing Facility | Payer: Medicaid Other | Attending: Internal Medicine | Admitting: Internal Medicine

## 2017-01-18 ENCOUNTER — Encounter (HOSPITAL_COMMUNITY): Payer: Self-pay | Admitting: Emergency Medicine

## 2017-01-18 ENCOUNTER — Emergency Department (HOSPITAL_COMMUNITY): Payer: Medicaid Other

## 2017-01-18 DIAGNOSIS — R51 Headache: Secondary | ICD-10-CM

## 2017-01-18 DIAGNOSIS — F410 Panic disorder [episodic paroxysmal anxiety] without agoraphobia: Secondary | ICD-10-CM | POA: Diagnosis present

## 2017-01-18 DIAGNOSIS — I951 Orthostatic hypotension: Secondary | ICD-10-CM | POA: Diagnosis not present

## 2017-01-18 DIAGNOSIS — Z7189 Other specified counseling: Secondary | ICD-10-CM

## 2017-01-18 DIAGNOSIS — G936 Cerebral edema: Secondary | ICD-10-CM | POA: Diagnosis present

## 2017-01-18 DIAGNOSIS — E785 Hyperlipidemia, unspecified: Secondary | ICD-10-CM | POA: Diagnosis present

## 2017-01-18 DIAGNOSIS — Z825 Family history of asthma and other chronic lower respiratory diseases: Secondary | ICD-10-CM

## 2017-01-18 DIAGNOSIS — Z8541 Personal history of malignant neoplasm of cervix uteri: Secondary | ICD-10-CM

## 2017-01-18 DIAGNOSIS — F17210 Nicotine dependence, cigarettes, uncomplicated: Secondary | ICD-10-CM | POA: Diagnosis present

## 2017-01-18 DIAGNOSIS — R519 Headache, unspecified: Secondary | ICD-10-CM | POA: Diagnosis present

## 2017-01-18 DIAGNOSIS — R630 Anorexia: Secondary | ICD-10-CM | POA: Diagnosis present

## 2017-01-18 DIAGNOSIS — Z681 Body mass index (BMI) 19 or less, adult: Secondary | ICD-10-CM | POA: Diagnosis not present

## 2017-01-18 DIAGNOSIS — C3412 Malignant neoplasm of upper lobe, left bronchus or lung: Secondary | ICD-10-CM | POA: Diagnosis present

## 2017-01-18 DIAGNOSIS — C7931 Secondary malignant neoplasm of brain: Secondary | ICD-10-CM | POA: Diagnosis present

## 2017-01-18 DIAGNOSIS — F1020 Alcohol dependence, uncomplicated: Secondary | ICD-10-CM | POA: Diagnosis present

## 2017-01-18 DIAGNOSIS — R918 Other nonspecific abnormal finding of lung field: Secondary | ICD-10-CM

## 2017-01-18 DIAGNOSIS — F411 Generalized anxiety disorder: Secondary | ICD-10-CM | POA: Diagnosis present

## 2017-01-18 DIAGNOSIS — Z818 Family history of other mental and behavioral disorders: Secondary | ICD-10-CM

## 2017-01-18 DIAGNOSIS — C799 Secondary malignant neoplasm of unspecified site: Secondary | ICD-10-CM

## 2017-01-18 DIAGNOSIS — J449 Chronic obstructive pulmonary disease, unspecified: Secondary | ICD-10-CM | POA: Diagnosis present

## 2017-01-18 DIAGNOSIS — F329 Major depressive disorder, single episode, unspecified: Secondary | ICD-10-CM | POA: Diagnosis present

## 2017-01-18 DIAGNOSIS — Z833 Family history of diabetes mellitus: Secondary | ICD-10-CM

## 2017-01-18 DIAGNOSIS — Z809 Family history of malignant neoplasm, unspecified: Secondary | ICD-10-CM

## 2017-01-18 DIAGNOSIS — Z515 Encounter for palliative care: Secondary | ICD-10-CM

## 2017-01-18 DIAGNOSIS — Z720 Tobacco use: Secondary | ICD-10-CM | POA: Diagnosis present

## 2017-01-18 DIAGNOSIS — D496 Neoplasm of unspecified behavior of brain: Secondary | ICD-10-CM | POA: Insufficient documentation

## 2017-01-18 DIAGNOSIS — Z09 Encounter for follow-up examination after completed treatment for conditions other than malignant neoplasm: Secondary | ICD-10-CM

## 2017-01-18 DIAGNOSIS — C3492 Malignant neoplasm of unspecified part of left bronchus or lung: Secondary | ICD-10-CM | POA: Diagnosis present

## 2017-01-18 HISTORY — DX: Alcohol dependence, uncomplicated: F10.20

## 2017-01-18 HISTORY — DX: Dysplasia of cervix uteri, unspecified: N87.9

## 2017-01-18 HISTORY — DX: Chronic obstructive pulmonary disease, unspecified: J44.9

## 2017-01-18 LAB — COMPREHENSIVE METABOLIC PANEL
ALK PHOS: 65 U/L (ref 38–126)
ALT: 15 U/L (ref 14–54)
AST: 18 U/L (ref 15–41)
Albumin: 3.4 g/dL — ABNORMAL LOW (ref 3.5–5.0)
Anion gap: 11 (ref 5–15)
BUN: 8 mg/dL (ref 6–20)
CALCIUM: 10 mg/dL (ref 8.9–10.3)
CO2: 27 mmol/L (ref 22–32)
CREATININE: 0.56 mg/dL (ref 0.44–1.00)
Chloride: 102 mmol/L (ref 101–111)
Glucose, Bld: 106 mg/dL — ABNORMAL HIGH (ref 65–99)
Potassium: 3.8 mmol/L (ref 3.5–5.1)
Sodium: 140 mmol/L (ref 135–145)
Total Bilirubin: 0.4 mg/dL (ref 0.3–1.2)
Total Protein: 7.2 g/dL (ref 6.5–8.1)

## 2017-01-18 LAB — CBC WITH DIFFERENTIAL/PLATELET
Basophils Absolute: 0 10*3/uL (ref 0.0–0.1)
Basophils Relative: 0 %
Eosinophils Absolute: 0.1 10*3/uL (ref 0.0–0.7)
Eosinophils Relative: 1 %
HEMATOCRIT: 41.3 % (ref 36.0–46.0)
Hemoglobin: 13.6 g/dL (ref 12.0–15.0)
LYMPHS ABS: 2 10*3/uL (ref 0.7–4.0)
LYMPHS PCT: 19 %
MCH: 31.8 pg (ref 26.0–34.0)
MCHC: 32.9 g/dL (ref 30.0–36.0)
MCV: 96.5 fL (ref 78.0–100.0)
Monocytes Absolute: 0.6 10*3/uL (ref 0.1–1.0)
Monocytes Relative: 5 %
NEUTROS PCT: 75 %
Neutro Abs: 8.1 10*3/uL — ABNORMAL HIGH (ref 1.7–7.7)
Platelets: 459 10*3/uL — ABNORMAL HIGH (ref 150–400)
RBC: 4.28 MIL/uL (ref 3.87–5.11)
RDW: 12.7 % (ref 11.5–15.5)
WBC: 10.7 10*3/uL — AB (ref 4.0–10.5)

## 2017-01-18 LAB — URINALYSIS, ROUTINE W REFLEX MICROSCOPIC
BACTERIA UA: NONE SEEN
Bilirubin Urine: NEGATIVE
Glucose, UA: NEGATIVE mg/dL
Ketones, ur: NEGATIVE mg/dL
Leukocytes, UA: NEGATIVE
Nitrite: NEGATIVE
Protein, ur: 30 mg/dL — AB
Specific Gravity, Urine: 1.011 (ref 1.005–1.030)
Squamous Epithelial / LPF: NONE SEEN
pH: 5 (ref 5.0–8.0)

## 2017-01-18 LAB — CBC
HCT: 42.1 % (ref 36.0–46.0)
Hemoglobin: 13.7 g/dL (ref 12.0–15.0)
MCH: 31.5 pg (ref 26.0–34.0)
MCHC: 32.5 g/dL (ref 30.0–36.0)
MCV: 96.8 fL (ref 78.0–100.0)
PLATELETS: 475 10*3/uL — AB (ref 150–400)
RBC: 4.35 MIL/uL (ref 3.87–5.11)
RDW: 12.7 % (ref 11.5–15.5)
WBC: 12.8 10*3/uL — ABNORMAL HIGH (ref 4.0–10.5)

## 2017-01-18 LAB — RAPID URINE DRUG SCREEN, HOSP PERFORMED
Amphetamines: NOT DETECTED
Barbiturates: NOT DETECTED
Benzodiazepines: NOT DETECTED
Cocaine: NOT DETECTED
Opiates: NOT DETECTED
Tetrahydrocannabinol: NOT DETECTED

## 2017-01-18 LAB — CREATININE, SERUM
Creatinine, Ser: 0.62 mg/dL (ref 0.44–1.00)
GFR calc Af Amer: >60 mL/min (ref >60)
GFR calc non Af Amer: >60 mL/min (ref >60)

## 2017-01-18 LAB — APTT: APTT: 34 s (ref 24–36)

## 2017-01-18 LAB — PROTIME-INR
INR: 1
Prothrombin Time: 13.2 seconds (ref 11.4–15.2)

## 2017-01-18 LAB — TSH: TSH: 0.751 u[IU]/mL (ref 0.350–4.500)

## 2017-01-18 MED ORDER — LORAZEPAM 1 MG PO TABS
0.0000 mg | ORAL_TABLET | Freq: Two times a day (BID) | ORAL | Status: AC
  Administered 2017-01-22: 2 mg via ORAL
  Filled 2017-01-18: qty 2

## 2017-01-18 MED ORDER — VITAMIN B-1 100 MG PO TABS
100.0000 mg | ORAL_TABLET | Freq: Every day | ORAL | Status: DC
  Administered 2017-01-19 – 2017-01-31 (×11): 100 mg via ORAL
  Filled 2017-01-18 (×11): qty 1

## 2017-01-18 MED ORDER — DEXAMETHASONE SODIUM PHOSPHATE 4 MG/ML IJ SOLN
10.0000 mg | Freq: Once | INTRAMUSCULAR | Status: AC
  Administered 2017-01-18: 10 mg via INTRAVENOUS
  Filled 2017-01-18: qty 3

## 2017-01-18 MED ORDER — NAPROXEN 250 MG PO TABS
375.0000 mg | ORAL_TABLET | Freq: Once | ORAL | Status: AC
  Administered 2017-01-18: 375 mg via ORAL
  Filled 2017-01-18: qty 2

## 2017-01-18 MED ORDER — THIAMINE HCL 100 MG/ML IJ SOLN: 100.0000 mg | Freq: Every day | INTRAMUSCULAR | Status: DC

## 2017-01-18 MED ORDER — GADOBENATE DIMEGLUMINE 529 MG/ML IV SOLN
10.0000 mL | Freq: Once | INTRAVENOUS | Status: AC
  Administered 2017-01-18: 10 mL via INTRAVENOUS

## 2017-01-18 MED ORDER — ACETAMINOPHEN 500 MG PO TABS
1000.0000 mg | ORAL_TABLET | Freq: Once | ORAL | Status: AC
  Administered 2017-01-18: 1000 mg via ORAL
  Filled 2017-01-18: qty 2

## 2017-01-18 MED ORDER — ALBUTEROL SULFATE (2.5 MG/3ML) 0.083% IN NEBU
3.0000 mL | INHALATION_SOLUTION | RESPIRATORY_TRACT | Status: DC
  Administered 2017-01-19: 3 mL via RESPIRATORY_TRACT
  Filled 2017-01-18: qty 3

## 2017-01-18 MED ORDER — THIAMINE HCL 100 MG/ML IJ SOLN
Freq: Once | INTRAVENOUS | Status: AC
  Administered 2017-01-18: 23:00:00 via INTRAVENOUS
  Filled 2017-01-18: qty 1000

## 2017-01-18 MED ORDER — NICOTINE 21 MG/24HR TD PT24
21.0000 mg | MEDICATED_PATCH | Freq: Every day | TRANSDERMAL | Status: DC
  Administered 2017-01-18 – 2017-01-27 (×8): 21 mg via TRANSDERMAL
  Filled 2017-01-18 (×12): qty 1

## 2017-01-18 MED ORDER — SODIUM CHLORIDE 0.9% FLUSH
3.0000 mL | Freq: Two times a day (BID) | INTRAVENOUS | Status: DC
  Administered 2017-01-19 – 2017-01-31 (×14): 3 mL via INTRAVENOUS

## 2017-01-18 MED ORDER — SODIUM CHLORIDE 0.9 % IV SOLN: 250.0000 mL | INTRAVENOUS | Status: DC | PRN

## 2017-01-18 MED ORDER — BISACODYL 5 MG PO TBEC
5.0000 mg | DELAYED_RELEASE_TABLET | Freq: Every day | ORAL | Status: DC | PRN
  Administered 2017-01-23: 5 mg via ORAL
  Filled 2017-01-18: qty 1

## 2017-01-18 MED ORDER — ZOLPIDEM TARTRATE 5 MG PO TABS
5.0000 mg | ORAL_TABLET | Freq: Every evening | ORAL | Status: DC | PRN
  Administered 2017-01-18 – 2017-01-29 (×6): 5 mg via ORAL
  Filled 2017-01-18 (×7): qty 1

## 2017-01-18 MED ORDER — DEXAMETHASONE 4 MG PO TABS
6.0000 mg | ORAL_TABLET | Freq: Four times a day (QID) | ORAL | Status: DC
  Administered 2017-01-19 – 2017-01-29 (×40): 6 mg via ORAL
  Administered 2017-01-29: 13:00:00 via ORAL
  Administered 2017-01-30 – 2017-01-31 (×6): 6 mg via ORAL
  Filled 2017-01-18 (×47): qty 2

## 2017-01-18 MED ORDER — HYDROCODONE-ACETAMINOPHEN 5-325 MG PO TABS: 1.0000 | ORAL_TABLET | ORAL | Status: DC | PRN

## 2017-01-18 MED ORDER — LORAZEPAM 1 MG PO TABS
0.0000 mg | ORAL_TABLET | Freq: Four times a day (QID) | ORAL | Status: AC
  Administered 2017-01-19 – 2017-01-20 (×5): 1 mg via ORAL
  Administered 2017-01-20: 2 mg via ORAL
  Filled 2017-01-18: qty 2
  Filled 2017-01-18 (×5): qty 1

## 2017-01-18 MED ORDER — VENLAFAXINE HCL ER 75 MG PO CP24
225.0000 mg | ORAL_CAPSULE | Freq: Every day | ORAL | Status: DC
  Administered 2017-01-19 – 2017-01-31 (×11): 225 mg via ORAL
  Filled 2017-01-18 (×3): qty 1
  Filled 2017-01-18: qty 3
  Filled 2017-01-18 (×4): qty 1
  Filled 2017-01-18: qty 3
  Filled 2017-01-18 (×3): qty 1

## 2017-01-18 MED ORDER — ONDANSETRON HCL 4 MG/2ML IJ SOLN
4.0000 mg | Freq: Four times a day (QID) | INTRAMUSCULAR | Status: DC | PRN
  Administered 2017-01-21 – 2017-01-23 (×2): 4 mg via INTRAVENOUS
  Filled 2017-01-18 (×2): qty 2

## 2017-01-18 MED ORDER — SENNA 8.6 MG PO TABS
1.0000 | ORAL_TABLET | Freq: Two times a day (BID) | ORAL | Status: DC
  Administered 2017-01-19 – 2017-01-31 (×22): 8.6 mg via ORAL
  Filled 2017-01-18 (×23): qty 1

## 2017-01-18 MED ORDER — ONDANSETRON HCL 4 MG PO TABS
4.0000 mg | ORAL_TABLET | Freq: Four times a day (QID) | ORAL | Status: DC | PRN
  Administered 2017-01-30: 4 mg via ORAL
  Filled 2017-01-18: qty 1

## 2017-01-18 MED ORDER — LORAZEPAM 1 MG PO TABS: 1.0000 mg | ORAL_TABLET | Freq: Four times a day (QID) | ORAL | Status: AC | PRN

## 2017-01-18 MED ORDER — POTASSIUM CHLORIDE IN NACL 20-0.9 MEQ/L-% IV SOLN
INTRAVENOUS | Status: DC
  Administered 2017-01-19 – 2017-01-24 (×9): via INTRAVENOUS
  Filled 2017-01-18 (×10): qty 1000

## 2017-01-18 MED ORDER — SODIUM CHLORIDE 0.9% FLUSH: 3.0000 mL | INTRAVENOUS | Status: DC | PRN

## 2017-01-18 MED ORDER — LORAZEPAM 2 MG/ML IJ SOLN: 1.0000 mg | Freq: Four times a day (QID) | INTRAMUSCULAR | Status: AC | PRN

## 2017-01-18 MED ORDER — ACETAMINOPHEN 650 MG RE SUPP: 650.0000 mg | Freq: Four times a day (QID) | RECTAL | Status: DC | PRN

## 2017-01-18 MED ORDER — ACETAMINOPHEN 325 MG PO TABS
650.0000 mg | ORAL_TABLET | Freq: Four times a day (QID) | ORAL | Status: DC | PRN
  Administered 2017-01-22 – 2017-01-23 (×2): 650 mg via ORAL
  Filled 2017-01-18 (×3): qty 2

## 2017-01-18 MED ORDER — MAGNESIUM CITRATE PO SOLN: 1.0000 | Freq: Once | ORAL | Status: DC | PRN

## 2017-01-18 MED ORDER — FOLIC ACID 1 MG PO TABS
1.0000 mg | ORAL_TABLET | Freq: Every day | ORAL | Status: DC
  Administered 2017-01-19 – 2017-01-31 (×11): 1 mg via ORAL
  Filled 2017-01-18 (×11): qty 1

## 2017-01-18 MED ORDER — ADULT MULTIVITAMIN W/MINERALS CH
1.0000 | ORAL_TABLET | Freq: Every day | ORAL | Status: DC
  Administered 2017-01-19 – 2017-01-31 (×11): 1 via ORAL
  Filled 2017-01-18 (×11): qty 1

## 2017-01-18 MED ORDER — POLYVINYL ALCOHOL 1.4 % OP SOLN: 1.0000 [drp] | OPHTHALMIC | Status: DC | PRN

## 2017-01-18 MED ORDER — OXYCODONE HCL 5 MG PO TABS
5.0000 mg | ORAL_TABLET | ORAL | Status: DC | PRN
  Administered 2017-01-19 – 2017-01-31 (×14): 5 mg via ORAL
  Filled 2017-01-18 (×16): qty 1

## 2017-01-18 MED ORDER — SENNOSIDES-DOCUSATE SODIUM 8.6-50 MG PO TABS: 1.0000 | ORAL_TABLET | Freq: Every evening | ORAL | Status: DC | PRN

## 2017-01-18 MED ORDER — LEVETIRACETAM 500 MG PO TABS
500.0000 mg | ORAL_TABLET | Freq: Two times a day (BID) | ORAL | Status: DC
  Administered 2017-01-18 – 2017-01-31 (×22): 500 mg via ORAL
  Filled 2017-01-18 (×23): qty 1

## 2017-01-18 MED ORDER — HEPARIN SODIUM (PORCINE) 5000 UNIT/ML IJ SOLN
5000.0000 [IU] | Freq: Three times a day (TID) | INTRAMUSCULAR | Status: DC
  Administered 2017-01-19 – 2017-01-23 (×14): 5000 [IU] via SUBCUTANEOUS
  Filled 2017-01-18 (×14): qty 1
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
## 2017-01-18 NOTE — ED Triage Notes (Signed)
Pt presents to ED for assessment of 1 month of: chills, low grade fevers, congestion, headaches, nausea, ear pressure.  Pt states symptoms are not getting better.

## 2017-01-18 NOTE — ED Notes (Signed)
Patient transported to MRI 
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
## 2017-01-18 NOTE — ED Notes (Signed)
Sent label to main lab to add on TSH
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
## 2017-01-18 NOTE — ED Notes (Signed)
Patient transported to CT 

## 2017-01-19 ENCOUNTER — Inpatient Hospital Stay (HOSPITAL_COMMUNITY): Payer: Medicaid Other

## 2017-01-19 LAB — GLUCOSE, CAPILLARY: Glucose-Capillary: 194 mg/dL — ABNORMAL HIGH (ref 65–99)

## 2017-01-19 LAB — HIV ANTIBODY (ROUTINE TESTING W REFLEX): HIV SCREEN 4TH GENERATION: NONREACTIVE

## 2017-01-19 MED ORDER — ENSURE ENLIVE PO LIQD: 237.0000 mL | Freq: Two times a day (BID) | ORAL | Status: DC

## 2017-01-19 MED ORDER — ALBUTEROL SULFATE (2.5 MG/3ML) 0.083% IN NEBU
3.0000 mL | INHALATION_SOLUTION | Freq: Three times a day (TID) | RESPIRATORY_TRACT | Status: DC
  Administered 2017-01-19 – 2017-01-20 (×3): 3 mL via RESPIRATORY_TRACT
  Filled 2017-01-19 (×3): qty 3

## 2017-01-19 MED ORDER — IOPAMIDOL (ISOVUE-300) INJECTION 61%
INTRAVENOUS | Status: AC
  Administered 2017-01-19: 100 mL
  Filled 2017-01-19: qty 100

## 2017-01-19 MED ORDER — ALUM & MAG HYDROXIDE-SIMETH 200-200-20 MG/5ML PO SUSP
30.0000 mL | ORAL | Status: DC | PRN
  Administered 2017-01-19 – 2017-01-20 (×2): 30 mL via ORAL
  Filled 2017-01-19 (×2): qty 30

## 2017-01-19 MED ORDER — GUAIFENESIN-DM 100-10 MG/5ML PO SYRP
5.0000 mL | ORAL_SOLUTION | ORAL | Status: DC | PRN
  Administered 2017-01-19: 5 mL via ORAL
  Filled 2017-01-19: qty 5

## 2017-01-19 MED ORDER — PRO-STAT SUGAR FREE PO LIQD
30.0000 mL | Freq: Two times a day (BID) | ORAL | Status: DC
  Administered 2017-01-19 – 2017-01-25 (×9): 30 mL via ORAL
  Filled 2017-01-19 (×13): qty 30

## 2017-01-19 NOTE — Progress Notes (Signed)
Radiology called to notify about CT of chest. Doctor Christella Noa paged, operator took message.

## 2017-01-19 NOTE — ED Notes (Signed)
Patient transported to CT 

## 2017-01-19 NOTE — ED Notes (Signed)
Pt continues to "wonder the hall" looking for a place to smoke and snacks.  RN escorted pt back to her room, verbal redirection was effective in getting the pt back in bed.  RN explained the importance of keeping the vital monitoring on.

## 2017-01-19 NOTE — Progress Notes (Signed)
Patient ID: Erika Hunter, female   DOB: February 10, 1971, 46 y.o.   MRN: [id number] Have consulted both oncology, and CT surgery for management recommendations. If small cell then surgery not indicated. They plan on seeing patients tomorrow as requested by me.

## 2017-01-19 NOTE — Progress Notes (Addendum)
Dr. Christella Noa notified CT scan result 8cm mass encasing the left main pulmonary artery. Also notified BP 86/58 manually. Patient asymptomatic, no new orders obtained. Will continue to monitor. Clarified consent order, no consent needed at this time. Erika Posey, RN

## 2017-01-19 NOTE — Progress Notes (Signed)
Patient ID: Erika Hunter, female   DOB: 1971/08/31, 46 y.o.   MRN: [id number] BP 136/79 (BP Location: Right Arm)   Pulse (!) 112   Temp 98.2 F (36.8 C) (Oral)   Resp 18   Ht '5\' 5"'$  (1.651 m)   Wt 53.5 kg (117 lb 14.4 oz)   LMP 05/01/2012   SpO2 98%   BMI 19.62 kg/m  Alert and oriented x 4, following all commands No change in neuro exam. No drift or motor deficits CT last night revealed large chest mass involving pulmonary artery.  I will consult oncology, and CT surgery.

## 2017-01-19 NOTE — Progress Notes (Signed)
Initial Nutrition Assessment  DOCUMENTATION CODES:  Non-severe (moderate) malnutrition in context of chronic illness   Pt meets criteria for MODERATE MALNUTRITION in the context of Chronic Ilness as evidenced by Mild-moderate fat/muscle wasting.  INTERVENTION:  Will order 30 mL Prostat BID, each supplement provides 100 kcal and 15 grams of protein.  Meal preferences passed to dietary  NUTRITION DIAGNOSIS:  Inadequate oral intake related to poor appetite as evidenced by per patient/family report.  GOAL:  Patient will meet greater than or equal to 90% of their needs  MONITOR:  PO intake, Supplement acceptance, Labs, I & O's  REASON FOR ASSESSMENT:  Malnutrition Screening Tool    ASSESSMENT:  46 y/o female  PMHx alcohol/tobacco abuse, cervical cancer, depression, anxiety, HLD who presented with confusion, headaches, Change in personality. difficulty thinking. Worked up for 2 large brain masses. Admitted for decadron, DT prophylaxis and OR for R frontal mass resection.   On RD arrival, multiple friends with patient in her room. They serve as historians as patient sounds to be slighlty confused.   Per their report, pt has had gradual loss of both appetite and weight. One friend states this began "6-7 months ago". They say she weighed 140 lbs at that time. This information is not supported by documentation, which indicated she has not weighed 140 lbs in roughly 5 years.  In fact, she appears to have gained a slight amount of weight in past 5 months.   Friends and pt state the pt will just "nibble" through out the day. She has not been eating many meals.   RD discussed importance of improving nutritional status prior to surgical intervention and discussed potential intervention. Patient does not like the taste of the Ensure/Boost at all. She was agreeable to Prostat. Meal wise, she says she enjoys "meat/potatoes". She was ok with receiving 2x starches and pudding with her meals.   NFPE:  Mild-Moderate muscle/fat wasting   Labs: WBC: 12.8, Albumin: 3.4 Medications: Ensure Enlive, Folate, Ativan, MVI, Senna, Thiamine, IVF   Recent Labs Lab 01/18/17 1951 01/18/17 2300  NA 140  --   K 3.8  --   CL 102  --   CO2 27  --   BUN 8  --   CREATININE 0.56 0.62  CALCIUM 10.0  --   GLUCOSE 106*  --    Diet Order:  Diet regular Room service appropriate? Yes; Fluid consistency: Thin  Skin:  Reviewed, no issues  Last BM:  3/2  Height:  Ht Readings from Last 1 Encounters:  01/18/17 '5\' 5"'$  (1.651 m)   Weight:  Wt Readings from Last 1 Encounters:  01/19/17 117 lb 14.4 oz (53.5 kg)   Wt Readings from Last 10 Encounters:  01/19/17 117 lb 14.4 oz (53.5 kg)  09/11/16 110 lb 12.8 oz (50.3 kg)  06/22/15 127 lb 9.6 oz (57.9 kg)  08/24/14 129 lb 3.2 oz (58.6 kg)  06/24/14 131 lb 6.4 oz (59.6 kg)  03/22/14 137 lb 12.8 oz (62.5 kg)  03/15/14 137 lb (62.1 kg)  08/13/13 139 lb 6.4 oz (63.2 kg)  05/07/12 140 lb 12.8 oz (63.9 kg)  04/17/12 140 lb 3.2 oz (63.6 kg)   Ideal Body Weight:  56.82 kg  BMI:  Body mass index is 19.62 kg/m.  Estimated Nutritional Needs:  Kcal:  1600-1800 (30-34 kcal/kg bw) Protein:  65-75 g (1.2-1.4 g/kg bw) Fluid:  1.6-1.8 L fluid  EDUCATION NEEDS:  Education needs no appropriate at this time  Burtis Junes RD, LDN,  CNSC Clinical Nutrition Pager: [phone] 01/19/2017 5:03 PM

## 2017-01-19 NOTE — Progress Notes (Signed)
Pt c/o insomnia, will admin ativan

## 2017-01-19 NOTE — Progress Notes (Signed)
Pt arrived to [facility], alert and oriented x 4, VS stable, no signs of acute distress, daughter at the bedside. Pt advised about valuable policy, and instructed on how to use call light, bed alarm utilized.   Will continue to monitor pt.

## 2017-01-20 DIAGNOSIS — C3492 Malignant neoplasm of unspecified part of left bronchus or lung: Secondary | ICD-10-CM

## 2017-01-20 DIAGNOSIS — R918 Other nonspecific abnormal finding of lung field: Secondary | ICD-10-CM | POA: Insufficient documentation

## 2017-01-20 DIAGNOSIS — C7931 Secondary malignant neoplasm of brain: Secondary | ICD-10-CM

## 2017-01-20 DIAGNOSIS — R222 Localized swelling, mass and lump, trunk: Secondary | ICD-10-CM

## 2017-01-20 LAB — URINALYSIS, ROUTINE W REFLEX MICROSCOPIC
Bilirubin Urine: NEGATIVE
Glucose, UA: NEGATIVE mg/dL
Hgb urine dipstick: NEGATIVE
Ketones, ur: NEGATIVE mg/dL
Leukocytes, UA: NEGATIVE
Nitrite: NEGATIVE
Protein, ur: NEGATIVE mg/dL
Specific Gravity, Urine: 1.019 (ref 1.005–1.030)
pH: 7 (ref 5.0–8.0)

## 2017-01-20 LAB — TYPE AND SCREEN
ABO/RH(D): B POS
Antibody Screen: NEGATIVE

## 2017-01-20 LAB — SURGICAL PCR SCREEN
MRSA, PCR: NEGATIVE
Staphylococcus aureus: NEGATIVE

## 2017-01-20 LAB — ABO/RH: ABO/RH(D): B POS

## 2017-01-20 MED ORDER — ALBUTEROL SULFATE (2.5 MG/3ML) 0.083% IN NEBU: 3.0000 mL | INHALATION_SOLUTION | Freq: Four times a day (QID) | RESPIRATORY_TRACT | Status: DC | PRN

## 2017-01-20 NOTE — Consult Note (Addendum)
[place]  Telephone:([phone]) [place]   Erika Hunter  DOB: Apr 17, 1971  MR#: [id number]  CSN#: [id number]    Requesting Physician: Neurosurgery Dr. Christella Noa  Patient Care Team: Wardell Honour, MD as PCP - General (Family Medicine) Ashok Pall, MD as Consulting Physician (Neurosurgery)  Reason for consult: lung mass with multiple brain metastasis   History of present illness:  Erika Hunter is a 46 yo Caucasian female, with past medical history of 20 pack year smoking, otherwise healthy, presented with one week history of headache and some difficulty at work for routine jobs, she presented to ED on 3/2, brain MRI showed 2 large masses, one in the right frontal lobe, 1 in the left or subtotal, and another small falcine mass on the left, highly suspicious for brain metastasis. CT chest, abdomen and pelvis showed a large left upper lobe mass, suspicious for primary lung cancer. She was admitted to hospital, currently on dexamethasone 6 mg 4 times a day, her headache has much improved. She appears to be sleepy when I saw her, but is oriented, and answered questions appropriately. Review of system also is positive for mild fatigue, decrease in appetite lately, and a total of 80 pounds weight loss in the past year.  MEDICAL HISTORY:  Past Medical History:  Diagnosis Date  . Anxiety   . Asthma   . Cancer ([place]) 11/19/1988   Cervical cancer; s/p cold knife conization.  . Depression   . Hyperlipidemia   . Steatohepatitis    h/o heavy alcohol use, hepatitis profile negative for A, B    SURGICAL HISTORY: Past Surgical History:  Procedure Laterality Date  . Cold knife conization  Early 2000s   Pre-cancerous lesions  . DILATION AND CURETTAGE OF UTERUS      SOCIAL HISTORY: Social History   Social History  . Marital status: Single    Spouse name: n/a  . Number of children: 2  . Years of education: High School   Occupational History    . Holiday representative   Social History Main Topics  . Smoking status: Current Every Day Smoker    Packs/day: 1.00    Years: 25.00  . Smokeless tobacco: Never Used  . Alcohol use 2.4 - 3.6 oz/week    4 - 6 Standard drinks or equivalent per week     Comment: wine  . Drug use: No  . Sexual activity: Yes   Other Topics Concern  . Not on file   Social History Narrative   Marital status: divorced; dating, moved to [place] from [place] to be with her boyfriend..      Children; 2 children; no grandchildren      Lives: with a co-worker      Employment: clerk at gas station x 5.5 years      Tobacco; 3/4 ppd since 46 yo      Alcohol:  3 glasses wine per day.      Drugs: none      Exercise:  none    FAMILY HISTORY: Family History  Problem Relation Age of Onset  . COPD Mother   . COPD Sister   . Cancer Sister   . Diabetes Sister   . Mental illness Sister     ALLERGIES:  has No Known Allergies.  MEDICATIONS:  Current Facility-Administered Medications  Medication Dose Route Frequency Provider Last Rate Last Dose  . 0.9 %  sodium chloride infusion  250 mL Intravenous PRN Ashok Pall, MD      . 0.9 % NaCl with KCl 20 mEq/ L  infusion   Intravenous Continuous Ashok Pall, MD 80 mL/hr at 01/20/17 0156    . acetaminophen (TYLENOL) tablet 650 mg  650 mg Oral Q6H PRN Ashok Pall, MD       Or  . acetaminophen (TYLENOL) suppository 650 mg  650 mg Rectal Q6H PRN Ashok Pall, MD      . albuterol (PROVENTIL) (2.5 MG/3ML) 0.083% nebulizer solution 3 mL  3 mL Inhalation TID Ashok Pall, MD   3 mL at 01/20/17 0737  . alum & mag hydroxide-simeth (MAALOX/MYLANTA) 200-200-20 MG/5ML suspension 30 mL  30 mL Oral Q4H PRN Ashok Pall, MD   30 mL at 01/20/17 0933  . bisacodyl (DULCOLAX) EC tablet 5 mg  5 mg Oral Daily PRN Ashok Pall, MD      . dexamethasone (DECADRON) tablet 6 mg  6 mg Oral Q6H Ashok Pall, MD   6 mg at 01/20/17 [phone]  . feeding supplement (PRO-STAT SUGAR FREE  64) liquid 30 mL  30 mL Oral BID Ashok Pall, MD   30 mL at 01/20/17 8588  . folic acid (FOLVITE) tablet 1 mg  1 mg Oral Daily Ashok Pall, MD   1 mg at 01/20/17 5027  . guaiFENesin-dextromethorphan (ROBITUSSIN DM) 100-10 MG/5ML syrup 5 mL  5 mL Oral Q4H PRN Ashok Pall, MD   5 mL at 01/19/17 0835  . heparin injection 5,000 Units  5,000 Units Subcutaneous Q8H Ashok Pall, MD   5,000 Units at 01/20/17 [phone]  . levETIRAcetam (KEPPRA) tablet 500 mg  500 mg Oral BID Ashok Pall, MD   500 mg at 01/20/17 8786  . LORazepam (ATIVAN) tablet 1 mg  1 mg Oral Q6H PRN Ashok Pall, MD       Or  . LORazepam (ATIVAN) injection 1 mg  1 mg Intravenous Q6H PRN Ashok Pall, MD      . LORazepam (ATIVAN) tablet 0-4 mg  0-4 mg Oral Q6H Ashok Pall, MD   1 mg at 01/20/17 0009   Followed by  . [START ON 01/21/2017] LORazepam (ATIVAN) tablet 0-4 mg  0-4 mg Oral Q12H Ashok Pall, MD      . magnesium citrate solution 1 Bottle  1 Bottle Oral Once PRN Ashok Pall, MD      . multivitamin with minerals tablet 1 tablet  1 tablet Oral Daily Ashok Pall, MD   1 tablet at 01/20/17 7672  . nicotine (NICODERM CQ - dosed in mg/24 hours) patch 21 mg  21 mg Transdermal Daily Ashok Pall, MD   21 mg at 01/20/17 0947  . ondansetron (ZOFRAN) tablet 4 mg  4 mg Oral Q6H PRN Ashok Pall, MD       Or  . ondansetron (ZOFRAN) injection 4 mg  4 mg Intravenous Q6H PRN Ashok Pall, MD      . oxyCODONE (Oxy IR/ROXICODONE) immediate release tablet 5 mg  5 mg Oral Q4H PRN Ashok Pall, MD   5 mg at 01/19/17 1602  . polyvinyl alcohol (LIQUIFILM TEARS) 1.4 % ophthalmic solution 1 drop  1 drop Both Eyes PRN Ashok Pall, MD      . senna (SENOKOT) tablet 8.6 mg  1 tablet Oral BID Ashok Pall, MD   8.6 mg at 01/20/17 0962  . senna-docusate (Senokot-S) tablet 1 tablet  1 tablet Oral QHS PRN Ashok Pall, MD      . sodium chloride flush (NS)  0.9 % injection 3 mL  3 mL Intravenous Q12H Ashok Pall, MD   3 mL at 01/19/17 2234  . sodium chloride  flush (NS) 0.9 % injection 3 mL  3 mL Intravenous PRN Ashok Pall, MD      . thiamine (VITAMIN B-1) tablet 100 mg  100 mg Oral Daily Ashok Pall, MD   100 mg at 01/20/17 1610  . venlafaxine XR (EFFEXOR-XR) 24 hr capsule 225 mg  225 mg Oral Q breakfast Ashok Pall, MD   225 mg at 01/20/17 0800  . zolpidem (AMBIEN) tablet 5 mg  5 mg Oral QHS PRN Ashok Pall, MD   5 mg at 01/19/17 2234    REVIEW OF SYSTEMS:   Constitutional: Denies fevers, chills or abnormal night sweats, (+) fatigue and weight loss Eyes: Denies blurriness of vision, double vision or watery eyes Ears, nose, mouth, throat, and face: Denies mucositis or sore throat Respiratory: mild dry cough, denies dyspnea or wheezes Cardiovascular: Denies palpitation, chest discomfort or lower extremity swelling Gastrointestinal:  Denies nausea, heartburn or change in bowel habits Skin: Denies abnormal skin rashes Lymphatics: Denies new lymphadenopathy or easy bruising Neurological:Denies numbness, tingling or new weaknesses Behavioral/Psych: Mood is stable, no new changes  All other systems were reviewed with the patient and are negative.  PHYSICAL EXAMINATION: ECOG PERFORMANCE STATUS: 1 - Symptomatic but completely ambulatory  Vitals:   01/20/17 0434 01/20/17 0600  BP: 105/67   Pulse: (!) 58 70  Resp: 20   Temp: 98.5 F (36.9 C)    Filed Weights   01/18/17 1737 01/19/17 0357  Weight: 110 lb (49.9 kg) 117 lb 14.4 oz (53.5 kg)    GENERAL:alert, no distress and comfortable SKIN: skin color, texture, turgor are normal, no rashes or significant lesions EYES: normal, conjunctiva are pink and non-injected, sclera clear OROPHARYNX:no exudate, no erythema and lips, buccal mucosa, and tongue normal  NECK: supple, thyroid normal size, non-tender, without nodularity LYMPH:  no palpable lymphadenopathy in the cervical, axillary or inguinal LUNGS: clear to auscultation and percussion with normal breathing effort HEART: regular rate &  rhythm and no murmurs and no lower extremity edema ABDOMEN:abdomen soft, non-tender and normal bowel sounds, there are 2 subcutaneous nodule in the abdomen, once it is on the left side of umbilical, patient states they have been there for 5 years has not changed lately. Musculoskeletal:no cyanosis of digits and no clubbing  PSYCH: alert & oriented x 3 with fluent speech   LABORATORY DATA:  I have reviewed the data as listed Lab Results  Component Value Date   WBC 12.8 (H) 01/18/2017   HGB 13.7 01/18/2017   HCT 42.1 01/18/2017   MCV 96.8 01/18/2017   PLT 475 (H) 01/18/2017    Recent Labs  01/18/17 1951 01/18/17 2300  NA 140  --   K 3.8  --   CL 102  --   CO2 27  --   GLUCOSE 106*  --   BUN 8  --   CREATININE 0.56 0.62  CALCIUM 10.0  --   GFRNONAA >60 >60  GFRAA >60 >60  PROT 7.2  --   ALBUMIN 3.4*  --   AST 18  --   ALT 15  --   ALKPHOS 65  --   BILITOT 0.4  --     RADIOGRAPHIC STUDIES: I have personally reviewed the radiological images as listed and agreed with the findings in the report. Ct Head Wo Contrast  Result Date: 01/18/2017 CLINICAL DATA:  46 y/o  F; headache and nausea for 1 month. EXAM: CT HEAD WITHOUT CONTRAST TECHNIQUE: Contiguous axial images were obtained from the base of the skull through the vertex without intravenous contrast. COMPARISON:  None. FINDINGS: Brain: Left parietal occipital cystic lesion measuring 26 x 49 x 45 mm (AP x ML x CC series 2, image 17) without significant surrounding edema. Mixed attenuation mass centered in the right frontal lobe measuring 48 x 42 x 44 mm (series 2, image 15 and series 4, image 24). There is extensive surrounding vasogenic edema in the brain and local mass effect with right to left midline shift of 13 mm, right to left subfalcine herniation, right-sided uncal herniation, and partial effacement of the right lateral ventricle. Vascular: Mild calcific atherosclerosis of the cavernous and paraclinoid internal carotid  arteries. Skull: Normal. Negative for fracture or focal lesion. Sinuses/Orbits: No acute finding. Other: None. IMPRESSION: 1. Mixed attenuation mass in right frontal lobe with extensive surrounding edema, 13 mm right to left midline shift, right to left subfalcine herniation, and right-sided uncal herniation. 2. Cystic lesion in the left parieto-occipital lobe of uncertain significance without surrounding edema. 3. MRI of the brain with and without contrast is recommended for further evaluation. These results were called by telephone at the time of interpretation on 01/18/2017 at 7:32 pm to Dr. Bryson Ha [facility] , who verbally acknowledged these results. Electronically Signed   By: Kristine Garbe M.D.   On: 01/18/2017 19:34   Ct Chest W Contrast  Result Date: 01/19/2017 CLINICAL DATA:  Patient presented with brain lesions consistent with metastases. EXAM: CT CHEST, ABDOMEN, AND PELVIS WITH CONTRAST TECHNIQUE: Multidetector CT imaging of the chest, abdomen and pelvis was performed following the standard protocol during bolus administration of intravenous contrast. CONTRAST:  121m ISOVUE-300 IOPAMIDOL (ISOVUE-300) INJECTION 61% COMPARISON:  None. FINDINGS: CT CHEST FINDINGS Cardiovascular: Large enhancing left upper lobe mass encases the left main pulmonary artery with luminal narrowing. There is probable invasion into the left pulmonary vein and left atrium. The heart is normal in size. The thoracic aorta is normal in caliber. Mediastinum/Nodes: Large left upper lobe heterogeneous mass is contiguous with the left hilum. Separate adenopathy not demonstrated. No additional mediastinal adenopathy. No right hilar adenopathy. No axillary adenopathy. Lungs/Pleura: Large heterogeneous enhancing left upper lobe mass extends from the hilum anterior superiorly to the pleural surface. There is likely some degree of postobstructive atelectasis making exact measurements difficult, however measures at least 6.6 x 6.4 x 8.7  cm. There is encasement of the left main pulmonary artery and likely invasion into the left pulmonary veins and left atrium. There is extension into the distal left mainstem bronchus at the bronchial bifurcation. No additional pulmonary nodule or mass. Moderate emphysema. No pleural fluid. Musculoskeletal: No blastic or evidence of destructive lytic lesions. There are no acute or suspicious osseous abnormalities. CT ABDOMEN PELVIS FINDINGS Hepatobiliary: No focal hepatic lesion. Gallbladder physiologically distended, no calcified stone. No biliary dilatation. Pancreas: No evidence of pancreatic mass. No ductal dilatation or inflammation. Spleen: Normal in size without focal abnormality. Adrenals/Urinary Tract: Adrenal glands are unremarkable. No adrenal nodule. Kidneys are normal, without renal calculi, focal lesion, or hydronephrosis. High-density material within the urinary bladder without bladder wall thickening. Stomach/Bowel: Lack of enteric contrast and paucity of body fat limits bowel assessment. Stomach is distended with ingested contents. No evidence of bowel wall thickening, distention or inflammation. No CT findings of focal mass. Vascular/Lymphatic: No retroperitoneal, pelvic, or mesenteric adenopathy. Mild aortic atherosclerosis without aneurysm. Reproductive: Uterus  and bilateral adnexa are unremarkable. Other: There is subcutaneous nodule in the abdominal wall at the level of the umbilicus to the left of midline measuring 12 mm. No free air, free fluid, or intra-abdominal fluid collection. No omental or peritoneal thickening. Musculoskeletal: There are no acute or suspicious osseous abnormalities. No blastic or evident destructive lytic lesions. IMPRESSION: 1. Large (at least 8 cm) enhancing left upper lobe lung mass encasing the left main pulmonary artery and invading the left pulmonary veins and left atrium. There is extension into the distal left mainstem bronchus. 2. Nonspecific 12 mm subcutaneous  nodule in the anterior abdominal wall, may be soft tissue metastasis or sebaceous cyst. 3. There is otherwise no evidence of primary or metastatic malignancy in the chest, abdomen, or pelvis. 4. Moderate emphysema. These results will be called to the ordering clinician or representative by the Radiologist Assistant, and communication documented in the PACS or zVision Dashboard. Electronically Signed   By: Jeb Levering M.D.   On: 01/19/2017 02:24   Mr Brain W And Wo Contrast  Result Date: 01/18/2017 CLINICAL DATA:  Initial evaluation for intracranial mass. EXAM: MRI HEAD WITHOUT AND WITH CONTRAST TECHNIQUE: Multiplanar, multiecho pulse sequences of the brain and surrounding structures were obtained without and with intravenous contrast. CONTRAST:  82m MULTIHANCE GADOBENATE DIMEGLUMINE 529 MG/ML IV SOLN COMPARISON:  Prior CT from earlier the same day. FINDINGS: Brain: Cerebral volume within normal limits. No significant cerebral white matter disease. Two large mass lesions are seen involving the bilateral cerebral hemispheres. Largest lesion is positioned within the right frontal lobe and measures 5.1 x 4.4 x 5.2 cm (series 12, image 24). Lesion is largely solid but demonstrates heterogeneous internal cystic components. Associated susceptibility artifact compatible with blood products. Heterogeneous post-contrast enhancement. Surrounding vasogenic edema throughout the right frontal region with associated 14 mm of right-to-left shift. Lateral ventricles are partially effaced without evidence for hydrocephalus or ventricular trapping at this time. Basilar cisterns R crowded but patent. Second lesion is positioned within the left occipital region and is predominantly cystic in nature with some internal soft tissue component posteriorly. Small amount of internal layering debris/hemorrhage as well. This lesion measures 5.7 x 3.4 x 5.0 cm (series 12, and image 24). Associated vasogenic edema within the left  occipital region without significant mass effect. This lesion demonstrates peripheral rim enhancement. There is a third 5 mm focus of enhancement within the left parafalcine region (series 12, image 37), suspicious for a third small lesion. Minimal associated edema noted within the adjacent cortical gray matter. No other definite lesions identified. Findings highly concerning for metastatic disease. No evidence for acute infarct. Gray-white matter differentiation otherwise maintained. No other areas of chronic infarction identified. No extra-axial fluid collection. Major dural sinuses are grossly patent. Pituitary gland grossly unremarkable. Vascular: Major intracranial vascular flow voids are maintained. ACA is are bowed to the left due to mass effect within the right frontal lobe. Skull and upper cervical spine: Craniocervical junction within normal limits. No herniation through the foramen magnum. Visualized upper cervical spine unremarkable. Bone marrow signal intensity normal. No scalp soft tissue abnormality. Sinuses/Orbits: Globes and orbital soft tissues within normal limits. Paranasal sinuses are clear. No mastoid effusion. Inner ear structures normal. Other: No other significant finding. 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 Extensive vasogenic edema about the lesion within the right frontal lobe with associated 14 mm of right-to-left shift. Lateral ventricles are partially effaced without hydrocephalus or evidence for ventricular trapping  at this time. Electronically Signed   By: Jeannine Boga M.D.   On: 01/18/2017 21:46   Ct Abdomen Pelvis W Contrast  Result Date: 01/19/2017 CLINICAL DATA:  Patient presented with brain lesions consistent with metastases. EXAM: CT CHEST, ABDOMEN, AND PELVIS WITH CONTRAST TECHNIQUE: Multidetector CT imaging of the chest, abdomen and pelvis was performed following the standard protocol during bolus  administration of intravenous contrast. CONTRAST:  118m ISOVUE-300 IOPAMIDOL (ISOVUE-300) INJECTION 61% COMPARISON:  None. FINDINGS: CT CHEST FINDINGS Cardiovascular: Large enhancing left upper lobe mass encases the left main pulmonary artery with luminal narrowing. There is probable invasion into the left pulmonary vein and left atrium. The heart is normal in size. The thoracic aorta is normal in caliber. Mediastinum/Nodes: Large left upper lobe heterogeneous mass is contiguous with the left hilum. Separate adenopathy not demonstrated. No additional mediastinal adenopathy. No right hilar adenopathy. No axillary adenopathy. Lungs/Pleura: Large heterogeneous enhancing left upper lobe mass extends from the hilum anterior superiorly to the pleural surface. There is likely some degree of postobstructive atelectasis making exact measurements difficult, however measures at least 6.6 x 6.4 x 8.7 cm. There is encasement of the left main pulmonary artery and likely invasion into the left pulmonary veins and left atrium. There is extension into the distal left mainstem bronchus at the bronchial bifurcation. No additional pulmonary nodule or mass. Moderate emphysema. No pleural fluid. Musculoskeletal: No blastic or evidence of destructive lytic lesions. There are no acute or suspicious osseous abnormalities. CT ABDOMEN PELVIS FINDINGS Hepatobiliary: No focal hepatic lesion. Gallbladder physiologically distended, no calcified stone. No biliary dilatation. Pancreas: No evidence of pancreatic mass. No ductal dilatation or inflammation. Spleen: Normal in size without focal abnormality. Adrenals/Urinary Tract: Adrenal glands are unremarkable. No adrenal nodule. Kidneys are normal, without renal calculi, focal lesion, or hydronephrosis. High-density material within the urinary bladder without bladder wall thickening. Stomach/Bowel: Lack of enteric contrast and paucity of body fat limits bowel assessment. Stomach is distended with  ingested contents. No evidence of bowel wall thickening, distention or inflammation. No CT findings of focal mass. Vascular/Lymphatic: No retroperitoneal, pelvic, or mesenteric adenopathy. Mild aortic atherosclerosis without aneurysm. Reproductive: Uterus and bilateral adnexa are unremarkable. Other: There is subcutaneous nodule in the abdominal wall at the level of the umbilicus to the left of midline measuring 12 mm. No free air, free fluid, or intra-abdominal fluid collection. No omental or peritoneal thickening. Musculoskeletal: There are no acute or suspicious osseous abnormalities. No blastic or evident destructive lytic lesions. IMPRESSION: 1. Large (at least 8 cm) enhancing left upper lobe lung mass encasing the left main pulmonary artery and invading the left pulmonary veins and left atrium. There is extension into the distal left mainstem bronchus. 2. Nonspecific 12 mm subcutaneous nodule in the anterior abdominal wall, may be soft tissue metastasis or sebaceous cyst. 3. There is otherwise no evidence of primary or metastatic malignancy in the chest, abdomen, or pelvis. 4. Moderate emphysema. These results will be called to the ordering clinician or representative by the Radiologist Assistant, and communication documented in the PACS or zVision Dashboard. Electronically Signed   By: MJeb LeveringM.D.   On: 01/19/2017 02:24    ASSESSMENT & PLAN: 46yo female with 20 PY smoking history, presented with headaches and difficulty on performing routine jobs, fatigue and weight loss.   1. LUL lung mass with 3 brain metastatic lesions  2. Brain mets with vasogenic edema, and midline shift, on dexamethasone  3. History of anxiety and panic attack  Recommendations: -I reviewed her scan images, this is most consistent with metastatic lung cancer. I don't have very high suspicion this is small cell lung cancer, but certainly the diagnosis depends on the tissue biopsy  -she has one subcutaneous nodule on  left side of the umbilical, but patient has clearly indicated it has been there for 5 years and has not changed lately, so the suspicion for metastasis is pretty low, may not worse of biopsy. -I agree she needs bronchoscopy and biopsy of the LUL lung mass, by pulmonary or CTS. I believe Dr. Christella Noa has called CTS service. Hope the biopsy can be done asap -I agree with dexa, her headache has improved overall -she would need brain RT after brain surgery, this will be arranged later  -Depends on the history of her lung cancer, I will order molecular testing if this is NSCLC -I plan to see her back after we have tissue diagnosis. I will arrange her follow up with [place].  -I appreciate the opportunity to participate this lovely lady's care.    All questions were answered. The patient knows to call the clinic with any problems, questions or concerns.      Truitt Merle, MD 01/20/2017 2:24 PM

## 2017-01-20 NOTE — Progress Notes (Signed)
Thoracic Surgery  Dr Servando Snare will be able to perform bronchoscopy Monday pm to biopsy left lung mass.

## 2017-01-20 NOTE — Progress Notes (Signed)
Pt remains alert and oriented x 4, anxious, attempting to go outside to smoke, ativan given, nicotine patch in place, bed alarm utilized and daughter at the bedside. RN able to redirect patient.

## 2017-01-20 NOTE — Progress Notes (Signed)
Patient ID: Erika Hunter, female   DOB: 10-02-1971, 46 y.o.   MRN: [id number] BP 105/67 (BP Location: Right Arm)   Pulse 70   Temp 98.5 F (36.9 C)   Resp 20   Ht '5\' 5"'$  (1.651 m)   Wt 53.5 kg (117 lb 14.4 oz)   LMP 05/01/2012   SpO2 96%   BMI 19.62 kg/m  Alert, impulsive, follows commands Perrl, full eom Symmetric facies Moving all extremities Await oncology consult.  No or tomorrow, will wait on biopsy by CT surgery, the earliest possibiltiy being Tuesday of this week.

## 2017-01-20 NOTE — Progress Notes (Addendum)
  Subjective:  Patient examined, CT  of chest personally reviewed and discussed with patient. 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 Patient also has brain metastases. History of heavy smoking. She denies chest pain or hemoptysis. She has had significant headaches and weight loss  Tissue biopsy to confirm diagnosis and identify cell type for  future multi modality  treatment is needed and would be best performed with bronchoscopic biopsy as transthoracic biopsy may be compounded by necrotic tissue or atelectatic lung parenchyma  Plan video bronchoscopy and transbronchial biopsy under general anesthesia on Tuesday, March 6 Hemodynamic parameters for last 24 hours:    Intake/Output from previous day: 03/03 0701 - 03/04 0700 In: 1256 [I.V.:1256] Out: -  Intake/Output this shift: No intake/output data recorded.      Physical Exam  General: Middle-aged Caucasian female with headache and anxiety HEENT: Normocephalic pupils equal , dentition adequate Neck: Supple without JVD, adenopathy, or bruit Chest: Left-sided tubular breath sounds, no rhonchi, no tenderness             or deformity Cardiovascular: Regular rate and rhythm, no murmur, no gallop, peripheral pulses             palpable in all extremities Abdomen:  Soft, nontender, no palpable mass or organomegaly. Small sebaceous subcutaneous cyst around umbilicus Extremities: Warm, well-perfused, no clubbing cyanosis edema or tenderness,              no venous stasis changes of the legs Rectal/GU: Deferred Neuro: Grossly non--focal and symmetrical throughout Skin: Clean and dry without rash or ulceration   Lab Results:  Recent Labs  01/18/17 1951 01/18/17 2300  WBC 10.7* 12.8*  HGB 13.6 13.7  HCT 41.3 42.1  PLT 459* 475*   BMET:  Recent Labs  01/18/17 1951 01/18/17 2300  NA 140  --   K 3.8  --   CL 102  --   CO2 27  --    GLUCOSE 106*  --   BUN 8  --   CREATININE 0.56 0.62  CALCIUM 10.0  --     PT/INR:  Recent Labs  01/18/17 2300  LABPROT 13.2  INR 1.00   ABG No results found for: PHART, HCO3, TCO2, ACIDBASEDEF, O2SAT CBG (last 3)   Recent Labs  01/19/17 0638  GLUCAP 194*    Assessment/Plan: S/P  Bronchoscopic biopsy under anesthesia on Tuesday, March 6. Procedure discussed with patient and her daughter including  expected benefits and risks   LOS: 2 days    Tharon Aquas Trigt III 01/20/2017

## 2017-01-21 ENCOUNTER — Inpatient Hospital Stay (HOSPITAL_COMMUNITY): Payer: Medicaid Other

## 2017-01-21 ENCOUNTER — Inpatient Hospital Stay (HOSPITAL_COMMUNITY): Payer: Medicaid Other | Admitting: Anesthesiology

## 2017-01-21 ENCOUNTER — Encounter (HOSPITAL_COMMUNITY)
Admission: EM | Disposition: A | Discharge status: Skilled Nursing Facility | Payer: Self-pay | Source: Home / Self Care | Attending: Neurosurgery

## 2017-01-21 ENCOUNTER — Encounter (HOSPITAL_COMMUNITY): Payer: Self-pay | Admitting: Certified Registered Nurse Anesthetist

## 2017-01-21 HISTORY — PX: VIDEO BRONCHOSCOPY: SHX5072

## 2017-01-21 HISTORY — PX: LUNG BIOPSY: SHX5088

## 2017-01-21 LAB — BASIC METABOLIC PANEL
Anion gap: 5 (ref 5–15)
BUN: 14 mg/dL (ref 6–20)
CO2: 26 mmol/L (ref 22–32)
Calcium: 9.1 mg/dL (ref 8.9–10.3)
Chloride: 107 mmol/L (ref 101–111)
Creatinine, Ser: 0.56 mg/dL (ref 0.44–1.00)
GFR calc Af Amer: >60 mL/min (ref >60)
GFR calc non Af Amer: >60 mL/min (ref >60)
Glucose, Bld: 135 mg/dL — ABNORMAL HIGH (ref 65–99)
Potassium: 4.1 mmol/L (ref 3.5–5.1)
Sodium: 138 mmol/L (ref 135–145)

## 2017-01-21 LAB — CBC
HCT: 36.2 % (ref 36.0–46.0)
Hemoglobin: 11.7 g/dL — ABNORMAL LOW (ref 12.0–15.0)
MCH: 32 pg (ref 26.0–34.0)
MCHC: 32.3 g/dL (ref 30.0–36.0)
MCV: 98.9 fL (ref 78.0–100.0)
Platelets: 378 10*3/uL (ref 150–400)
RBC: 3.66 MIL/uL — ABNORMAL LOW (ref 3.87–5.11)
RDW: 13.3 % (ref 11.5–15.5)
WBC: 12.8 10*3/uL — ABNORMAL HIGH (ref 4.0–10.5)

## 2017-01-21 LAB — APTT: aPTT: 32 seconds (ref 24–36)

## 2017-01-21 LAB — PROTIME-INR
INR: 1.04
Prothrombin Time: 13.6 seconds (ref 11.4–15.2)

## 2017-01-21 SURGERY — BRONCHOSCOPY, VIDEO-ASSISTED
Anesthesia: General | Site: Chest

## 2017-01-21 MED ORDER — SUGAMMADEX SODIUM 200 MG/2ML IV SOLN
INTRAVENOUS | Status: DC | PRN
Start: 2017-01-21 — End: 2017-01-21
  Administered 2017-01-21: 200 mg via INTRAVENOUS

## 2017-01-21 MED ORDER — DEXAMETHASONE SODIUM PHOSPHATE 10 MG/ML IJ SOLN
INTRAMUSCULAR | Status: DC | PRN
  Administered 2017-01-21: 10 mg via INTRAVENOUS

## 2017-01-21 MED ORDER — OXYCODONE HCL 5 MG/5ML PO SOLN: 5.0000 mg | Freq: Once | ORAL | Status: DC | PRN

## 2017-01-21 MED ORDER — MIDAZOLAM HCL 5 MG/5ML IJ SOLN
INTRAMUSCULAR | Status: DC | PRN
  Administered 2017-01-21: 2 mg via INTRAVENOUS

## 2017-01-21 MED ORDER — EPINEPHRINE PF 1 MG/ML IJ SOLN
INTRAMUSCULAR | Status: DC | PRN
Start: 2017-01-21 — End: 2017-01-21
  Administered 2017-01-21: 1 mg via ENDOTRACHEOPULMONARY

## 2017-01-21 MED ORDER — OXYCODONE HCL 5 MG PO TABS: 5.0000 mg | ORAL_TABLET | Freq: Once | ORAL | Status: DC | PRN

## 2017-01-21 MED ORDER — FENTANYL CITRATE (PF) 100 MCG/2ML IJ SOLN
INTRAMUSCULAR | Status: AC
  Filled 2017-01-21: qty 4

## 2017-01-21 MED ORDER — MIDAZOLAM HCL 2 MG/2ML IJ SOLN
INTRAMUSCULAR | Status: AC
  Filled 2017-01-21: qty 2

## 2017-01-21 MED ORDER — PROPOFOL 10 MG/ML IV BOLUS
INTRAVENOUS | Status: AC
  Filled 2017-01-21: qty 20

## 2017-01-21 MED ORDER — PROPOFOL 10 MG/ML IV BOLUS
INTRAVENOUS | Status: DC | PRN
  Administered 2017-01-21: 40 mg via INTRAVENOUS
  Administered 2017-01-21: 160 mg via INTRAVENOUS

## 2017-01-21 MED ORDER — LIDOCAINE HCL (CARDIAC) 20 MG/ML IV SOLN
INTRAVENOUS | Status: DC | PRN
  Administered 2017-01-21: 60 mg via INTRAVENOUS

## 2017-01-21 MED ORDER — EPINEPHRINE PF 1 MG/ML IJ SOLN
INTRAMUSCULAR | Status: AC
  Filled 2017-01-21: qty 1

## 2017-01-21 MED ORDER — ROCURONIUM BROMIDE 100 MG/10ML IV SOLN
INTRAVENOUS | Status: DC | PRN
  Administered 2017-01-21: 40 mg via INTRAVENOUS

## 2017-01-21 MED ORDER — FENTANYL CITRATE (PF) 100 MCG/2ML IJ SOLN
INTRAMUSCULAR | Status: DC | PRN
  Administered 2017-01-21: 100 ug via INTRAVENOUS
  Administered 2017-01-21: 50 ug via INTRAVENOUS

## 2017-01-21 MED ORDER — LACTATED RINGERS IV SOLN
INTRAVENOUS | Status: DC
  Administered 2017-01-21: 13:00:00 via INTRAVENOUS

## 2017-01-21 MED ORDER — ONDANSETRON HCL 4 MG/2ML IJ SOLN
INTRAMUSCULAR | Status: DC | PRN
  Administered 2017-01-21: 4 mg via INTRAVENOUS

## 2017-01-21 MED ORDER — FENTANYL CITRATE (PF) 100 MCG/2ML IJ SOLN: 25.0000 ug | INTRAMUSCULAR | Status: DC | PRN

## 2017-01-21 MED ORDER — LACTATED RINGERS IV SOLN
INTRAVENOUS | Status: DC | PRN
  Administered 2017-01-21: 15:00:00 via INTRAVENOUS

## 2017-01-21 MED ORDER — LIDOCAINE HCL (PF) 1 % IJ SOLN
INTRAMUSCULAR | Status: AC
  Filled 2017-01-21: qty 30

## 2017-01-21 MED ORDER — ONDANSETRON HCL 4 MG/2ML IJ SOLN: 4.0000 mg | Freq: Once | INTRAMUSCULAR | Status: DC | PRN

## 2017-01-21 MED ORDER — LACTATED RINGERS IV SOLN
INTRAVENOUS | Status: DC
Start: 2017-01-21 — End: 2017-01-24

## 2017-01-21 MED ORDER — DEXMEDETOMIDINE HCL IN NACL 200 MCG/50ML IV SOLN
INTRAVENOUS | Status: AC
  Filled 2017-01-21: qty 50

## 2017-01-21 SURGICAL SUPPLY — 20 items
BRUSH CYTOL CELLEBRITY 1.5X140 (MISCELLANEOUS) ×3 IMPLANT
CANISTER SUCT 3000ML PPV (MISCELLANEOUS) ×3 IMPLANT
CONT SPEC 4OZ CLIKSEAL STRL BL (MISCELLANEOUS) ×3 IMPLANT
COVER BACK TABLE 60X90IN (DRAPES) ×3 IMPLANT
FILTER STRAW FLUID ASPIR (MISCELLANEOUS) ×3 IMPLANT
FORCEPS BIOP RJ4 1.8 (CUTTING FORCEPS) ×3 IMPLANT
FORCEPS RADIAL JAW LRG 4 PULM (INSTRUMENTS) ×6 IMPLANT
GAUZE SPONGE 4X4 12PLY STRL (GAUZE/BANDAGES/DRESSINGS) ×3 IMPLANT
KIT CLEAN ENDO COMPLIANCE (KITS) ×3 IMPLANT
KIT ROOM TURNOVER OR (KITS) ×3 IMPLANT
MARKER SKIN DUAL TIP RULER LAB (MISCELLANEOUS) IMPLANT
NEEDLE BIOPSY TRANSBRONCH 21G (NEEDLE) IMPLANT
NS IRRIG 1000ML POUR BTL (IV SOLUTION) ×3 IMPLANT
OIL SILICONE PENTAX (PARTS (SERVICE/REPAIRS)) ×3 IMPLANT
RADIAL JAW LRG 4 PULMONARY (INSTRUMENTS) ×3
SYR 20ML ECCENTRIC (SYRINGE) ×6 IMPLANT
SYR 3ML LL SCALE MARK (SYRINGE) ×3 IMPLANT
TOWEL OR 17X24 6PK STRL BLUE (TOWEL DISPOSABLE) ×3 IMPLANT
TRAP SPECIMEN MUCOUS 40CC (MISCELLANEOUS) IMPLANT
TUBE CONNECTING 20X1/4 (TUBING) ×3 IMPLANT

## 2017-01-21 NOTE — Brief Op Note (Signed)
      [address]       [place] [address]             [phone]       01/21/2017  4:31 PM  PATIENT:  Erika Hunter  46 y.o. female  PRE-OPERATIVE DIAGNOSIS:  LEFT LUNG MASS  POST-OPERATIVE DIAGNOSIS:  LEFT LUNG MASS, malignant on Frozen section  poss Small Cell final path pending   PROCEDURE:  Procedure(s): VIDEO BRONCHOSCOPY (N/A) LUNG BIOPSY LEFT UPPER LOBE (Left)  SURGEON:  Surgeon(s) and Role:    * Grace Isaac, MD - Primary   ANESTHESIA:   general  EBL:  No intake/output data recorded.  BLOOD ADMINISTERED:none  DRAINS: none   LOCAL MEDICATIONS USED:  NONE  SPECIMEN:  Source of Specimen:  left upper lobe bronchus  DISPOSITION OF SPECIMEN:  PATHOLOGY  COUNTS:  YES   DICTATION: .Other Dictation: Dictation Number .  PLAN OF CARE: patient is already  in patient   PATIENT DISPOSITION:  PACU - hemodynamically stable.   Delay start of Pharmacological VTE agent (>24hrs) due to surgical blood loss or risk of bleeding: yes

## 2017-01-21 NOTE — Anesthesia Preprocedure Evaluation (Addendum)
Anesthesia Evaluation  Patient identified by MRN, date of birth, ID band Patient awake    Reviewed: Allergy & Precautions, NPO status , Patient's Chart, lab work & pertinent test results  Airway Mallampati: II  TM Distance: >3 FB Neck ROM: Full    Dental  (+) Teeth Intact, Dental Advisory Given   Pulmonary Current Smoker,    breath sounds clear to auscultation       Cardiovascular  Rhythm:Regular Rate:Normal     Neuro/Psych    GI/Hepatic   Endo/Other    Renal/GU      Musculoskeletal   Abdominal   Peds  Hematology   Anesthesia Other Findings Lethargic, answers questions appropriately.  Reproductive/Obstetrics                            Anesthesia Physical Anesthesia Plan  ASA: III  Anesthesia Plan: General   Post-op Pain Management:    Induction: Intravenous  Airway Management Planned: Oral ETT  Additional Equipment:   Intra-op Plan:   Post-operative Plan: Extubation in OR  Informed Consent: I have reviewed the patients History and Physical, chart, labs and discussed the procedure including the risks, benefits and alternatives for the proposed anesthesia with the patient or authorized representative who has indicated his/her understanding and acceptance.   Dental advisory given  Plan Discussed with: Anesthesiologist and CRNA  Anesthesia Plan Comments:        Anesthesia Quick Evaluation

## 2017-01-21 NOTE — Care Management Note (Signed)
Case Management Note  Patient Details  Name: Erika Hunter MRN: [id number] Date of Birth: [birth year]  Subjective/Objective:            Admitted with lung mass with multiple brain metastasis.        [name] (Daughter)      [phone]       Action/Plan:  Dr Servando Snare / bronchoscopy Monday pm to biopsy left lung mass.  Expected Discharge Date:                  Expected Discharge Plan:  Home/Self Care  In-House Referral:  Clinical Social Work, Development worker, community  Discharge planning Services  CM Consult, [facility]  Post Acute Care Choice:    Choice offered to:     DME Arranged:    DME Agency:     HH Arranged:    [place] Agency:     Status of Service:  In process, will continue to follow  If discussed at Long Length of Stay Meetings, dates discussed:    Additional Comments:  Sharin Mons, RN 01/21/2017, 12:54 PM

## 2017-01-21 NOTE — Anesthesia Procedure Notes (Signed)
Procedure Name: Intubation Date/Time: 01/21/2017 3:12 PM Performed by: Candis Shine Pre-anesthesia Checklist: Patient identified, Emergency Drugs available, Suction available and Patient being monitored Patient Re-evaluated:Patient Re-evaluated prior to inductionOxygen Delivery Method: Circle System Utilized Preoxygenation: Pre-oxygenation with 100% oxygen Intubation Type: IV induction Ventilation: Mask ventilation without difficulty and Oral airway inserted - appropriate to patient size Laryngoscope Size: Mac and 3 Grade View: Grade I Tube type: Oral Tube size: 8.5 mm Number of attempts: 1 Airway Equipment and Method: Stylet and Oral airway Placement Confirmation: ETT inserted through vocal cords under direct vision,  positive ETCO2 and breath sounds checked- equal and bilateral Secured at: 22 cm Tube secured with: Tape Dental Injury: Teeth and Oropharynx as per pre-operative assessment

## 2017-01-21 NOTE — Progress Notes (Signed)
Patient ID: Erika Hunter, female   DOB: 1971-10-09, 46 y.o.   MRN: [id number] BP 122/77 (BP Location: Left Arm)   Pulse (!) 55   Temp 98.1 F (36.7 C)   Resp 16   Ht '5\' 5"'$  (1.651 m)   Wt 53.5 kg (117 lb 14.4 oz)   LMP 05/01/2012   SpO2 97%   BMI 19.62 kg/m  Erika Hunter was seen in the PACU post bronchoscopic biopsy of thoracic mass.  Alert, following commands Moving all extremities. Pathologic diagnosis will guide treatment, if small cell, no craniotomy.  Doing well post biopsy.

## 2017-01-21 NOTE — Progress Notes (Signed)
Pre Procedure note for inpatients:   Erika Hunter has been scheduled for Procedure(s): VIDEO BRONCHOSCOPY (N/A) LUNG BIOPSY (Left) today. The various methods of treatment have been discussed with the patient. After consideration of the risks, benefits and treatment options the patient has consented to the planned procedure.   The patient has been seen and labs reviewed. There are no changes in the patient's condition to prevent proceeding with the planned procedure today.  Recent labs:  Lab Results  Component Value Date   WBC 12.8 (H) 01/21/2017   HGB 11.7 (L) 01/21/2017   HCT 36.2 01/21/2017   PLT 378 01/21/2017   GLUCOSE 135 (H) 01/21/2017   CHOL 195 08/24/2014   TRIG 104 08/24/2014   HDL 65 08/24/2014   LDLCALC 109 (H) 08/24/2014   ALT 15 01/18/2017   AST 18 01/18/2017   NA 138 01/21/2017   K 4.1 01/21/2017   CL 107 01/21/2017   CREATININE 0.56 01/21/2017   BUN 14 01/21/2017   CO2 26 01/21/2017   TSH 0.751 01/18/2017   INR 1.04 01/21/2017   I agree with assesment by Dr Darcey Nora. I have discussed the risks and options of proceeding with bronchoscopy and biopsy to obtain a tissue dx.   The goals risks and alternatives of the planned surgical procedure Bronchoscopy   have been discussed with the patient in detail. The risks of the procedure including death, infection, stroke, myocardial infarction, bleeding, blood transfusion have all been discussed specifically.  I have quoted Erika Hunter a 2 % of perioperative mortality and a complication rate as high as 10 %. The patient's questions have been answered.Erika Hunter is willing  to proceed with the planned procedure.   Grace Isaac, MD 01/21/2017 2:32 PM

## 2017-01-21 NOTE — Transfer of Care (Signed)
Immediate Anesthesia Transfer of Care Note  Patient: Erika Hunter  Procedure(s) Performed: Procedure(s): VIDEO BRONCHOSCOPY (N/A) LUNG BIOPSY LEFT UPPER LOBE (Left)  Patient Location: PACU  Anesthesia Type:General  Level of Consciousness: awake, oriented and patient cooperative  Airway & Oxygen Therapy: Patient Spontanous Breathing and Patient connected to nasal cannula oxygen  Post-op Assessment: Report given to RN and Post -op Vital signs reviewed and stable  Post vital signs: Reviewed and stable  Last Vitals:  Vitals:   01/21/17 0519 01/21/17 1636  BP: (!) 117/56 (P) 122/65  Pulse: (!) 46 (P) 68  Resp: 17 (P) 16  Temp: 37 C (P) 36.7 C    Last Pain:  Vitals:   01/21/17 1636  TempSrc:   PainSc: (P) 0-No pain      Patients Stated Pain Goal: 0 (71/06/26 9485)  Complications: No apparent anesthesia complications

## 2017-01-22 ENCOUNTER — Ambulatory Visit
Admit: 2017-01-22 | Discharge: 2017-01-22 | Disposition: A | Discharge status: Home / Self Care | Payer: Self-pay | Attending: Radiation Oncology | Admitting: Radiation Oncology

## 2017-01-22 ENCOUNTER — Encounter (HOSPITAL_COMMUNITY): Payer: Self-pay | Admitting: Cardiothoracic Surgery

## 2017-01-22 DIAGNOSIS — C3492 Malignant neoplasm of unspecified part of left bronchus or lung: Secondary | ICD-10-CM | POA: Diagnosis present

## 2017-01-22 NOTE — Anesthesia Postprocedure Evaluation (Signed)
Anesthesia Post Note  Patient: Erika Hunter  Procedure(s) Performed: Procedure(s) (LRB): VIDEO BRONCHOSCOPY (N/A) LUNG BIOPSY LEFT UPPER LOBE (Left)  Patient location during evaluation: PACU Anesthesia Type: General Level of consciousness: awake Pain management: pain level controlled Vital Signs Assessment: post-procedure vital signs reviewed and stable Respiratory status: spontaneous breathing, nonlabored ventilation, respiratory function stable and patient connected to nasal cannula oxygen Cardiovascular status: blood pressure returned to baseline and stable Postop Assessment: no signs of nausea or vomiting Anesthetic complications: no       Last Vitals:  Vitals:   01/22/17 0550 01/22/17 0619  BP: 113/70 119/63  Pulse: (!) 132 (!) 43  Resp: 18   Temp: 36.7 C     Last Pain:  Vitals:   01/22/17 0550  TempSrc: Oral  PainSc:                  Catalina Gravel

## 2017-01-22 NOTE — Op Note (Signed)
NAMEKIMBERLA, DRISKILL NO.:  [id number]  MEDICAL RECORD NO.:  [id number]  LOCATION:  C25C                         FACILITY:  [facility]  PHYSICIAN:  Lanelle Bal, MD    DATE OF BIRTH:  [birth year]  DATE OF PROCEDURE:  01/21/2017 DATE OF DISCHARGE:                              OPERATIVE REPORT   PREOPERATIVE DIAGNOSES:  Left upper lobe lung mass with probable malignancy with brain metastasis.  POSTOPERATIVE DIAGNOSES:  Left upper lobe lung mass with probable malignancy with brain metastasis.  PROCEDURE PERFORMED:  Bronchoscopy with brushings and biopsy of left upper lobe lung mass.  SURGEON:  Lanelle Bal, MD.  BRIEF HISTORY:  The patient is a 46 year old female, who presented to the Neurosurgical Service.  CT scan revealed 2 brain lesions.  Chest x- ray and CT of the chest confirmed a left upper lobe collapse with probable mass, to obtain a tissue diagnosis.  Thoracic Surgery was consulted and bronchoscopy was suggested and the patient agreed and signed an informed consent.  DESCRIPTION OF PROCEDURE:  The patient underwent general endotracheal anesthesia without incidence.  A single-lumen endotracheal tube was placed.  Appropriate time-out was performed.  We then proceeded with a fiberoptic bronchoscopy, with a 2.8 mm video bronchoscope.  The trachea and the bifurcation appeared normal.  The right tracheal bronchial tree to the subsegmental level appeared normal.  The scope was then passed into the left mainstem bronchus.  There was an easily identifiable mass lesion protruding from the left upper lobe bronchus, the lower lobe bronchus was patent.  Brushings and biopsies were taken of this area.  A portion of this biopsy was sent for frozen section, but the only diagnosis that could be made was necrotic tissue.  Further biopsies with biopsy forceps were done, and again sent for frozen.  These 2 showed necrotic tissue.  At this point, with the biopsy  forceps dug into deeper area of the mass and additional 3rd cup of specimens were sent for Pathology, which confirmed malignancy.  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 Lyndon Code. To ensure that we had sufficient tissue for further stains and testing, additional biopsies were obtained.  Topical diluted epinephrine solution was instilled onto the mass area to decrease bleeding, actual blood loss was very minimal.  When we completed, the tracheobronchial tree was clear of any secretions.  The scopes were removed, and the patient was awakened and extubated in the operating room, having tolerated the procedure without complication, and was transferred to the Recovery Room for further postoperative care.     Lanelle Bal, MD    EG/MEDQ  D:  01/22/2017  T:  01/22/2017  Job:  [id number]

## 2017-01-22 NOTE — Consult Note (Addendum)
Radiation Oncology         [phone] ________________________________  Name: Erika Hunter MRN: [id number]  Date: 01/22/17  DOB: 24-Aug-1971  VZ:DGLOV,FIEPPI, MD  No ref. provider found     REFERRING PHYSICIAN: No ref. provider found   DIAGNOSIS: Diagnoses of Metastatic disease ([place]), Lung mass, and Postop check were pertinent to this visit.   HISTORY OF PRESENT ILLNESS: Erika Hunter is a 46 y.o. female seen at the request of Dr. 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 The patient presented to the emergency department because of one monthhistory of headache, she had a CT scan of the brain on 3-18 which revealed 2 large lesions, one in the right frontal lobe, as well as a mass in the left parietal-occipital region. Midline shift of 13 mm right to left was also noted with subfalcine herniation, and right-sided uncal herniation with partial effacement of the right lateral ventricle. An MRI scan on 01/18/2017 revealed a 5.2 mm focus of enhancement within the left parafalcine region, a 5.7 x 3.4 x 5.0 cm lesion within the left occipital region with associated vasogenic edema and peripheral ring enhancement, a 5.1 x 4.4 x 5.2 cm lesion in the right frontal lobe which largely was solid in nature but did demonstrate heterogeneous internal cystic components, and surrounding vasogenic edema. Right to left midline shift of 14 mm was noted with partial effacement of the lateral ventricles, and crowding of the basilar cisterns though these were patent. A metastatic workup was begun, and a CT scan of the chest abdomen and pelvis on 01/19/2017 revealed a large 6.6 x 6.4 x 8.7 cm mass in the left lung with encasement of the left pulmonary artery and likely invasion into the left pulmonary veins and left atrium. Extension into the distal left mainstem bronchus at the bronchial bifurcation was noted, as well as a 12 mm subcutaneous nodule in the abdominal wall at the level of the umbilicus.  Of note upon admission she was given dexamethasone and is been on 6 mg 4 times a day. Her headache is much improved, and in discussing her case further appears that she has lost about 80 pounds in the last year. She did undergo bronchoscopy on 01/21/2017 and final pathology of 3 of the 4 specimens confirms small cell carcinoma. We are asked to consult regarding the patient's brain disease.      PREVIOUS RADIATION THERAPY: No   PAST MEDICAL HISTORY:  Past Medical History:  Diagnosis Date  . Anxiety   . Asthma   . Cancer ([place]) 11/19/1988   Cervical cancer; s/p cold knife conization.  . Depression   . Hyperlipidemia   . Steatohepatitis    h/o heavy alcohol use, hepatitis profile negative for A, B       PAST SURGICAL HISTORY: Past Surgical History:  Procedure Laterality Date  . Cold knife conization  Early 2000s   Pre-cancerous lesions  . DILATION AND CURETTAGE OF UTERUS    . LUNG BIOPSY Left 01/21/2017   Procedure: LUNG BIOPSY LEFT UPPER LOBE;  Surgeon: Grace Isaac, MD;  Location: [facility];  Service: Thoracic;  Laterality: Left;  Marland Kitchen VIDEO BRONCHOSCOPY N/A 01/21/2017   Procedure: VIDEO BRONCHOSCOPY;  Surgeon: Grace Isaac, MD;  Location: [facility] OR;  Service: Thoracic;  Laterality: N/A;     FAMILY HISTORY:  Family History  Problem Relation Age of Onset  . COPD Mother   . COPD Sister   . Cancer Sister   .  Diabetes Sister   . Mental illness Sister      SOCIAL HISTORY:  reports that she has been smoking.  She has a 25.00 pack-year smoking history. She has never used smokeless tobacco. She reports that she drinks about 2.4 - 3.6 oz of alcohol per week . She reports that she does not use drugs. The patient is single and lives in [place]. She has an adult daughter, Velna Hatchet who lives in [place].   ALLERGIES: Patient has no known allergies.   MEDICATIONS:  Current Facility-Administered Medications  Medication Dose Route Frequency Provider Last Rate Last Dose  . 0.9 %   sodium chloride infusion  250 mL Intravenous PRN Ashok Pall, MD      . 0.9 % NaCl with KCl 20 mEq/ L  infusion   Intravenous Continuous Ashok Pall, MD 80 mL/hr at 01/22/17 1314    . acetaminophen (TYLENOL) tablet 650 mg  650 mg Oral Q6H PRN Ashok Pall, MD   650 mg at 01/22/17 1302   Or  . acetaminophen (TYLENOL) suppository 650 mg  650 mg Rectal Q6H PRN Ashok Pall, MD      . albuterol (PROVENTIL) (2.5 MG/3ML) 0.083% nebulizer solution 3 mL  3 mL Inhalation Q6H PRN Ashok Pall, MD      . alum & mag hydroxide-simeth (MAALOX/MYLANTA) 200-200-20 MG/5ML suspension 30 mL  30 mL Oral Q4H PRN Ashok Pall, MD   30 mL at 01/20/17 0933  . bisacodyl (DULCOLAX) EC tablet 5 mg  5 mg Oral Daily PRN Ashok Pall, MD      . dexamethasone (DECADRON) tablet 6 mg  6 mg Oral Q6H Ashok Pall, MD   6 mg at 01/22/17 1238  . feeding supplement (PRO-STAT SUGAR FREE 64) liquid 30 mL  30 mL Oral BID Ashok Pall, MD   30 mL at 01/22/17 0927  . fentaNYL (SUBLIMAZE) injection 25-50 mcg  25-50 mcg Intravenous Q5 min PRN Roberts Gaudy, MD      . folic acid (FOLVITE) tablet 1 mg  1 mg Oral Daily Ashok Pall, MD   1 mg at 01/22/17 0926  . guaiFENesin-dextromethorphan (ROBITUSSIN DM) 100-10 MG/5ML syrup 5 mL  5 mL Oral Q4H PRN Ashok Pall, MD   5 mL at 01/19/17 0835  . heparin injection 5,000 Units  5,000 Units Subcutaneous Q8H Ashok Pall, MD   5,000 Units at 01/22/17 [phone]  . lactated ringers infusion   Intravenous Continuous Effie Berkshire, MD 10 mL/hr at 01/21/17 1328    . lactated ringers infusion   Intravenous Continuous Grace Isaac, MD      . levETIRAcetam (KEPPRA) tablet 500 mg  500 mg Oral BID Ashok Pall, MD   500 mg at 01/22/17 5009  . LORazepam (ATIVAN) tablet 0-4 mg  0-4 mg Oral Q12H Ashok Pall, MD   2 mg at 01/22/17 1237  . magnesium citrate solution 1 Bottle  1 Bottle Oral Once PRN Ashok Pall, MD      . multivitamin with minerals tablet 1 tablet  1 tablet Oral Daily Ashok Pall, MD   1 tablet  at 01/22/17 0925  . nicotine (NICODERM CQ - dosed in mg/24 hours) patch 21 mg  21 mg Transdermal Daily Ashok Pall, MD   21 mg at 01/22/17 0927  . ondansetron (ZOFRAN) tablet 4 mg  4 mg Oral Q6H PRN Ashok Pall, MD       Or  . ondansetron (ZOFRAN) injection 4 mg  4 mg Intravenous Q6H PRN Ashok Pall, MD  4 mg at 01/21/17 1821  . oxyCODONE (Oxy IR/ROXICODONE) immediate release tablet 5 mg  5 mg Oral Q4H PRN Ashok Pall, MD   5 mg at 01/19/17 1602  . polyvinyl alcohol (LIQUIFILM TEARS) 1.4 % ophthalmic solution 1 drop  1 drop Both Eyes PRN Ashok Pall, MD      . senna (SENOKOT) tablet 8.6 mg  1 tablet Oral BID Ashok Pall, MD   8.6 mg at 01/22/17 0926  . senna-docusate (Senokot-S) tablet 1 tablet  1 tablet Oral QHS PRN Ashok Pall, MD      . sodium chloride flush (NS) 0.9 % injection 3 mL  3 mL Intravenous Q12H Ashok Pall, MD   3 mL at 01/22/17 0929  . sodium chloride flush (NS) 0.9 % injection 3 mL  3 mL Intravenous PRN Ashok Pall, MD      . thiamine (VITAMIN B-1) tablet 100 mg  100 mg Oral Daily Ashok Pall, MD   100 mg at 01/22/17 0927  . venlafaxine XR (EFFEXOR-XR) 24 hr capsule 225 mg  225 mg Oral Q breakfast Ashok Pall, MD   225 mg at 01/22/17 0926  . zolpidem (AMBIEN) tablet 5 mg  5 mg Oral QHS PRN Ashok Pall, MD   5 mg at 01/19/17 2234     REVIEW OF SYSTEMS: On review of systems, the patient reports that she is coping with her new diagnosis. She reports she has found faith in the last 24 hours and is hopeful that her attitude for getting better will help her to overcome her diagnosis. She denies any chest pain, shortness of breath, cough, fevers, chills, night sweats, or hemoptysis. She reports her headache and the pressure she had is improved.  She's lost about 80 pounds over the last year. Her speech and word findings has improved in the last 24-48 hours. She denies any bowel or bladder disturbances, and denies abdominal pain, nausea or vomiting. She denies any new  musculoskeletal or joint aches or pains. A complete review of systems is obtained and is otherwise negative.     PHYSICAL EXAM:  Wt Readings from Last 3 Encounters:  01/19/17 117 lb 14.4 oz (53.5 kg)  09/11/16 110 lb 12.8 oz (50.3 kg)  06/22/15 127 lb 9.6 oz (57.9 kg)   Temp Readings from Last 3 Encounters:  01/22/17 98.8 F (37.1 C) (Oral)  09/11/16 98.2 F (36.8 C) (Oral)  06/22/15 98.1 F (36.7 C) (Oral)   BP Readings from Last 3 Encounters:  01/22/17 109/60  09/11/16 120/70  06/22/15 106/76   Pulse Readings from Last 3 Encounters:  01/22/17 65  09/11/16 84  06/22/15 (!) 103   Pain Assessment Pain Score: 6 /10  In general this is a chronically ill caucasian female who appears older than her stated age, who is in no acute distress. She is alert and oriented x4 and appropriate throughout the examination. HEENT reveals that the patient is normocephalic, atraumatic. EOMs are intact. PERRLA. Skin is intact without any evidence of gross lesions. Cardiovascular exam reveals a regular rate and rhythm, no clicks rubs or murmurs are auscultated. Chest is clear to auscultation bilaterally. Lymphatic assessment is performed and does not reveal any adenopathy in the cervical, supraclavicular, axillary, or inguinal chains. Abdomen has active bowel sounds in all quadrants and is intact. The abdomen is soft, non tender, non distended. Lower extremities are negative for pretibial pitting edema, deep calf tenderness, cyanosis or clubbing. She has intact sensory perception of bilateral lower extremities. She has appropriate  grip strength in the right hand, and 4/5 strength on the left.    ECOG = 2  0 - Asymptomatic (Fully active, able to carry on all predisease activities without restriction)  1 - Symptomatic but completely ambulatory (Restricted in physically strenuous activity but ambulatory and able to carry out work of a light or sedentary nature. For example, light housework, office  work)  2 - Symptomatic, <50% in bed during the day (Ambulatory and capable of all self care but unable to carry out any work activities. Up and about more than 50% of waking hours)  3 - Symptomatic, >50% in bed, but not bedbound (Capable of only limited self-care, confined to bed or chair 50% or more of waking hours)  4 - Bedbound (Completely disabled. Cannot carry on any self-care. Totally confined to bed or chair)  5 - Death   Eustace Pen MM, Creech RH, Tormey DC, et al. [phone]). "Toxicity and response criteria of the [facility]". [place] Oncol. 5 (6): 649-55    LABORATORY DATA:  Lab Results  Component Value Date   WBC 12.8 (H) 01/21/2017   HGB 11.7 (L) 01/21/2017   HCT 36.2 01/21/2017   MCV 98.9 01/21/2017   PLT 378 01/21/2017   Lab Results  Component Value Date   NA 138 01/21/2017   K 4.1 01/21/2017   CL 107 01/21/2017   CO2 26 01/21/2017   Lab Results  Component Value Date   ALT 15 01/18/2017   AST 18 01/18/2017   ALKPHOS 65 01/18/2017   BILITOT 0.4 01/18/2017      RADIOGRAPHY: Dg Chest 2 View  Result Date: 01/21/2017 CLINICAL DATA:  Known lung mass.  Chest pain. EXAM: CHEST  2 VIEW COMPARISON:  CT scan January 19, 2017 FINDINGS: The known left upper lobe lung mass is identified, extending into the left hilum. No other nodules or masses. No evidence of pneumonia. No pneumothorax. The heart, right hilum, and mediastinum are normal. The left hilum is obscured by the known mass. IMPRESSION: Known left upper lobe lung mass, better characterized on CT imaging. No other abnormalities. Electronically Signed   By: Dorise Bullion III M.D   On: 01/21/2017 09:44   Ct Head Wo Contrast  Result Date: 01/18/2017 CLINICAL DATA:  46 y/o  F; headache and nausea for 1 month. EXAM: CT HEAD WITHOUT CONTRAST TECHNIQUE: Contiguous axial images were obtained from the base of the skull through the vertex without intravenous contrast. COMPARISON:  None. FINDINGS: Brain: Left  parietal occipital cystic lesion measuring 26 x 49 x 45 mm (AP x ML x CC series 2, image 17) without significant surrounding edema. Mixed attenuation mass centered in the right frontal lobe measuring 48 x 42 x 44 mm (series 2, image 15 and series 4, image 24). There is extensive surrounding vasogenic edema in the brain and local mass effect with right to left midline shift of 13 mm, right to left subfalcine herniation, right-sided uncal herniation, and partial effacement of the right lateral ventricle. Vascular: Mild calcific atherosclerosis of the cavernous and paraclinoid internal carotid arteries. Skull: Normal. Negative for fracture or focal lesion. Sinuses/Orbits: No acute finding. Other: None. IMPRESSION: 1. Mixed attenuation mass in right frontal lobe with extensive surrounding edema, 13 mm right to left midline shift, right to left subfalcine herniation, and right-sided uncal herniation. 2. Cystic lesion in the left parieto-occipital lobe of uncertain significance without surrounding edema. 3. MRI of the brain with and without contrast is recommended for further  evaluation. These results were called by telephone at the time of interpretation on 01/18/2017 at 7:32 pm to Dr. Bryson Ha [facility] , who verbally acknowledged these results. Electronically Signed   By: Kristine Garbe M.D.   On: 01/18/2017 19:34   Ct Chest W Contrast  Result Date: 01/19/2017 CLINICAL DATA:  Patient presented with brain lesions consistent with metastases. EXAM: CT CHEST, ABDOMEN, AND PELVIS WITH CONTRAST TECHNIQUE: Multidetector CT imaging of the chest, abdomen and pelvis was performed following the standard protocol during bolus administration of intravenous contrast. CONTRAST:  174m ISOVUE-300 IOPAMIDOL (ISOVUE-300) INJECTION 61% COMPARISON:  None. FINDINGS: CT CHEST FINDINGS Cardiovascular: Large enhancing left upper lobe mass encases the left main pulmonary artery with luminal narrowing. There is probable invasion into the  left pulmonary vein and left atrium. The heart is normal in size. The thoracic aorta is normal in caliber. Mediastinum/Nodes: Large left upper lobe heterogeneous mass is contiguous with the left hilum. Separate adenopathy not demonstrated. No additional mediastinal adenopathy. No right hilar adenopathy. No axillary adenopathy. Lungs/Pleura: Large heterogeneous enhancing left upper lobe mass extends from the hilum anterior superiorly to the pleural surface. There is likely some degree of postobstructive atelectasis making exact measurements difficult, however measures at least 6.6 x 6.4 x 8.7 cm. There is encasement of the left main pulmonary artery and likely invasion into the left pulmonary veins and left atrium. There is extension into the distal left mainstem bronchus at the bronchial bifurcation. No additional pulmonary nodule or mass. Moderate emphysema. No pleural fluid. Musculoskeletal: No blastic or evidence of destructive lytic lesions. There are no acute or suspicious osseous abnormalities. CT ABDOMEN PELVIS FINDINGS Hepatobiliary: No focal hepatic lesion. Gallbladder physiologically distended, no calcified stone. No biliary dilatation. Pancreas: No evidence of pancreatic mass. No ductal dilatation or inflammation. Spleen: Normal in size without focal abnormality. Adrenals/Urinary Tract: Adrenal glands are unremarkable. No adrenal nodule. Kidneys are normal, without renal calculi, focal lesion, or hydronephrosis. High-density material within the urinary bladder without bladder wall thickening. Stomach/Bowel: Lack of enteric contrast and paucity of body fat limits bowel assessment. Stomach is distended with ingested contents. No evidence of bowel wall thickening, distention or inflammation. No CT findings of focal mass. Vascular/Lymphatic: No retroperitoneal, pelvic, or mesenteric adenopathy. Mild aortic atherosclerosis without aneurysm. Reproductive: Uterus and bilateral adnexa are unremarkable. Other:  There is subcutaneous nodule in the abdominal wall at the level of the umbilicus to the left of midline measuring 12 mm. No free air, free fluid, or intra-abdominal fluid collection. No omental or peritoneal thickening. Musculoskeletal: There are no acute or suspicious osseous abnormalities. No blastic or evident destructive lytic lesions. IMPRESSION: 1. Large (at least 8 cm) enhancing left upper lobe lung mass encasing the left main pulmonary artery and invading the left pulmonary veins and left atrium. There is extension into the distal left mainstem bronchus. 2. Nonspecific 12 mm subcutaneous nodule in the anterior abdominal wall, may be soft tissue metastasis or sebaceous cyst. 3. There is otherwise no evidence of primary or metastatic malignancy in the chest, abdomen, or pelvis. 4. Moderate emphysema. These results will be called to the ordering clinician or representative by the Radiologist Assistant, and communication documented in the PACS or zVision Dashboard. Electronically Signed   By: MJeb LeveringM.D.   On: 01/19/2017 02:24   Mr Brain W And Wo Contrast  Result Date: 01/18/2017 CLINICAL DATA:  Initial evaluation for intracranial mass. EXAM: MRI HEAD WITHOUT AND WITH CONTRAST TECHNIQUE: Multiplanar, multiecho pulse sequences of the brain and  surrounding structures were obtained without and with intravenous contrast. CONTRAST:  53m MULTIHANCE GADOBENATE DIMEGLUMINE 529 MG/ML IV SOLN COMPARISON:  Prior CT from earlier the same day. FINDINGS: Brain: Cerebral volume within normal limits. No significant cerebral white matter disease. Two large mass lesions are seen involving the bilateral cerebral hemispheres. Largest lesion is positioned within the right frontal lobe and measures 5.1 x 4.4 x 5.2 cm (series 12, image 24). Lesion is largely solid but demonstrates heterogeneous internal cystic components. Associated susceptibility artifact compatible with blood products. Heterogeneous post-contrast  enhancement. Surrounding vasogenic edema throughout the right frontal region with associated 14 mm of right-to-left shift. Lateral ventricles are partially effaced without evidence for hydrocephalus or ventricular trapping at this time. Basilar cisterns R crowded but patent. Second lesion is positioned within the left occipital region and is predominantly cystic in nature with some internal soft tissue component posteriorly. Small amount of internal layering debris/hemorrhage as well. This lesion measures 5.7 x 3.4 x 5.0 cm (series 12, and image 24). Associated vasogenic edema within the left occipital region without significant mass effect. This lesion demonstrates peripheral rim enhancement. There is a third 5 mm focus of enhancement within the left parafalcine region (series 12, image 37), suspicious for a third small lesion. Minimal associated edema noted within the adjacent cortical gray matter. No other definite lesions identified. Findings highly concerning for metastatic disease. No evidence for acute infarct. Gray-white matter differentiation otherwise maintained. No other areas of chronic infarction identified. No extra-axial fluid collection. Major dural sinuses are grossly patent. Pituitary gland grossly unremarkable. Vascular: Major intracranial vascular flow voids are maintained. ACA is are bowed to the left due to mass effect within the right frontal lobe. Skull and upper cervical spine: Craniocervical junction within normal limits. No herniation through the foramen magnum. Visualized upper cervical spine unremarkable. Bone marrow signal intensity normal. No scalp soft tissue abnormality. Sinuses/Orbits: Globes and orbital soft tissues within normal limits. Paranasal sinuses are clear. No mastoid effusion. Inner ear structures normal. Other: No other significant finding. 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 Extensive vasogenic edema  about the lesion within the right frontal lobe with associated 14 mm of right-to-left shift. Lateral ventricles are partially effaced without hydrocephalus or evidence for ventricular trapping at this time. Electronically Signed   By: BJeannine BogaM.D.   On: 01/18/2017 21:46   Ct Abdomen Pelvis W Contrast  Result Date: 01/19/2017 CLINICAL DATA:  Patient presented with brain lesions consistent with metastases. EXAM: CT CHEST, ABDOMEN, AND PELVIS WITH CONTRAST TECHNIQUE: Multidetector CT imaging of the chest, abdomen and pelvis was performed following the standard protocol during bolus administration of intravenous contrast. CONTRAST:  1064mISOVUE-300 IOPAMIDOL (ISOVUE-300) INJECTION 61% COMPARISON:  None. FINDINGS: CT CHEST FINDINGS Cardiovascular: Large enhancing left upper lobe mass encases the left main pulmonary artery with luminal narrowing. There is probable invasion into the left pulmonary vein and left atrium. The heart is normal in size. The thoracic aorta is normal in caliber. Mediastinum/Nodes: Large left upper lobe heterogeneous mass is contiguous with the left hilum. Separate adenopathy not demonstrated. No additional mediastinal adenopathy. No right hilar adenopathy. No axillary adenopathy. Lungs/Pleura: Large heterogeneous enhancing left upper lobe mass extends from the hilum anterior superiorly to the pleural surface. There is likely some degree of postobstructive atelectasis making exact measurements difficult, however measures at least 6.6 x 6.4 x 8.7 cm. There is encasement of the left main pulmonary artery and likely invasion into the left  pulmonary veins and left atrium. There is extension into the distal left mainstem bronchus at the bronchial bifurcation. No additional pulmonary nodule or mass. Moderate emphysema. No pleural fluid. Musculoskeletal: No blastic or evidence of destructive lytic lesions. There are no acute or suspicious osseous abnormalities. CT ABDOMEN PELVIS FINDINGS  Hepatobiliary: No focal hepatic lesion. Gallbladder physiologically distended, no calcified stone. No biliary dilatation. Pancreas: No evidence of pancreatic mass. No ductal dilatation or inflammation. Spleen: Normal in size without focal abnormality. Adrenals/Urinary Tract: Adrenal glands are unremarkable. No adrenal nodule. Kidneys are normal, without renal calculi, focal lesion, or hydronephrosis. High-density material within the urinary bladder without bladder wall thickening. Stomach/Bowel: Lack of enteric contrast and paucity of body fat limits bowel assessment. Stomach is distended with ingested contents. No evidence of bowel wall thickening, distention or inflammation. No CT findings of focal mass. Vascular/Lymphatic: No retroperitoneal, pelvic, or mesenteric adenopathy. Mild aortic atherosclerosis without aneurysm. Reproductive: Uterus and bilateral adnexa are unremarkable. Other: There is subcutaneous nodule in the abdominal wall at the level of the umbilicus to the left of midline measuring 12 mm. No free air, free fluid, or intra-abdominal fluid collection. No omental or peritoneal thickening. Musculoskeletal: There are no acute or suspicious osseous abnormalities. No blastic or evident destructive lytic lesions. IMPRESSION: 1. Large (at least 8 cm) enhancing left upper lobe lung mass encasing the left main pulmonary artery and invading the left pulmonary veins and left atrium. There is extension into the distal left mainstem bronchus. 2. Nonspecific 12 mm subcutaneous nodule in the anterior abdominal wall, may be soft tissue metastasis or sebaceous cyst. 3. There is otherwise no evidence of primary or metastatic malignancy in the chest, abdomen, or pelvis. 4. Moderate emphysema. These results will be called to the ordering clinician or representative by the Radiologist Assistant, and communication documented in the PACS or zVision Dashboard. Electronically Signed   By: Jeb Levering M.D.   On:  01/19/2017 02:24   Dg Chest Port 1 View  Result Date: 01/21/2017 CLINICAL DATA:  Post bronchoscopy. EXAM: PORTABLE CHEST 1 VIEW COMPARISON:  Chest radiograph January 21, 2017 at 0937 hours FINDINGS: Large LEFT hilar/ upper lobe mass, no pneumothorax. Increased lung volumes with mild chronic interstitial changes most compatible with COPD. No pleural effusion. LEFT proximal humerus presumed bone infarct. IMPRESSION: LEFT hilar/ upper lobe mass, COPD.  No pneumothorax. Electronically Signed   By: Elon Alas M.D.   On: 01/21/2017 18:33       IMPRESSION/PLAN: 1. 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 The patient is counseled on the MRI findings, and we reviewed the pathology from her bronchoscopy yesterday. I have contacted Dr. Lisbeth Renshaw and we anticipate a course of whole brain radiotherapy given the histology. We will anticipate medical oncology or internal medicine to transfer the patient to [facility] if approved by Dr. Christella Noa. I spoke with the paitent about the nature of radiotherapy. We discussed that Dr. Lisbeth Renshaw will review ehr case and based on her disease, I would anticipate Dr. Lisbeth Renshaw recommending a course of 10 fractions of radiation daily. We will plan for simulation tomorrow, and treatment in the afternoon. We agree with continuation of steroids.   In a visit lasting 70 minutes, greater than 50% of the time was spent face to face and in floor time regarding her case, and coordinating the patient's care.    Carola Rhine, PAC

## 2017-01-22 NOTE — Progress Notes (Signed)
Patient ID: Erika Hunter, female   DOB: 26-Jun-1971, 46 y.o.   MRN: [id number] BP 109/60 (BP Location: Left Arm)   Pulse 65   Temp 98.8 F (37.1 C) (Oral)   Resp 17   Ht '5\' 5"'$  (1.651 m)   Wt 53.5 kg (117 lb 14.4 oz)   LMP 05/01/2012   SpO2 97%   BMI 19.62 kg/m  Unfortunate diagnosis small cell CA lung. No indication for craniotomy. Oncology will transfer to [facility] for definitive care.

## 2017-01-22 NOTE — Progress Notes (Signed)
Erika Hunter   DOB:1971/06/19   UY#:[id number]   DGL#:[id number]  Oncology follow up   Subjective: Patient underwent bronchoscopy and a biopsy by Dr. Servando Snare yesterday, tolerated the procedure well. Biopsy showed a small cell lung cancer. I discussed the results with patient and her daughter at bedside. She seems to be more alert today, still has mild headaches, overall feels better.   Objective:  Vitals:   01/22/17 1200 01/22/17 1302  BP: 118/67 109/60  Pulse: (!) 56 65  Resp:  17  Temp:  98.8 F (37.1 C)    Body mass index is 19.62 kg/m.  Intake/Output Summary (Last 24 hours) at 01/22/17 1335 Last data filed at 01/22/17 0600  Gross per 24 hour  Intake          2745.33 ml  Output              230 ml  Net          2515.33 ml     Sclerae unicteric  Oropharynx clear  No peripheral adenopathy  Lungs clear -- no rales or rhonchi  Heart regular rate and rhythm  Abdomen benign  MSK no focal spinal tenderness, no peripheral edema  Neuro nonfocal, alert, oriented, with normal speech     CBG (last 3)  No results for input(s): GLUCAP in the last 72 hours.   Labs:  Lab Results  Component Value Date   WBC 12.8 (H) 01/21/2017   HGB 11.7 (L) 01/21/2017   HCT 36.2 01/21/2017   MCV 98.9 01/21/2017   PLT 378 01/21/2017   NEUTROABS 8.1 (H) 01/18/2017   CMP Latest Ref Rng & Units 01/21/2017 01/18/2017 01/18/2017  Glucose 65 - 99 mg/dL 135(H) - 106(H)  BUN 6 - 20 mg/dL 14 - 8  Creatinine 0.44 - 1.00 mg/dL 0.56 0.62 0.56  Sodium 135 - 145 mmol/L 138 - 140  Potassium 3.5 - 5.1 mmol/L 4.1 - 3.8  Chloride 101 - 111 mmol/L 107 - 102  CO2 22 - 32 mmol/L 26 - 27  Calcium 8.9 - 10.3 mg/dL 9.1 - 10.0  Total Protein 6.5 - 8.1 g/dL - - 7.2  Total Bilirubin 0.3 - 1.2 mg/dL - - 0.4  Alkaline Phos 38 - 126 U/L - - 65  AST 15 - 41 U/L - - 18  ALT 14 - 54 U/L - - 15    Urine Studies No results for input(s): UHGB, CRYS in the last 72 hours.  Invalid input(s): UACOL, UAPR, USPG, UPH, UTP,  UGL, UKET, UBIL, UNIT, UROB, ULEU, UEPI, UWBC, URBC, UBAC, CAST, UCOM, BILUA  Basic Metabolic Panel:  Recent Labs Lab 01/18/17 1951 01/18/17 2300 01/21/17 0513  NA 140  --  138  K 3.8  --  4.1  CL 102  --  107  CO2 27  --  26  GLUCOSE 106*  --  135*  BUN 8  --  14  CREATININE 0.56 0.62 0.56  CALCIUM 10.0  --  9.1   GFR Estimated Creatinine Clearance: 73.4 mL/min (by C-G formula based on SCr of 0.56 mg/dL). Liver Function Tests:  Recent Labs Lab 01/18/17 1951  AST 18  ALT 15  ALKPHOS 65  BILITOT 0.4  PROT 7.2  ALBUMIN 3.4*   No results for input(s): LIPASE, AMYLASE in the last 168 hours. No results for input(s): AMMONIA in the last 168 hours. Coagulation profile  Recent Labs Lab 01/18/17 2300 01/21/17 0513  INR 1.00 1.04    CBC:  Recent  Labs Lab 01/18/17 1951 01/18/17 2300 01/21/17 0513  WBC 10.7* 12.8* 12.8*  NEUTROABS 8.1*  --   --   HGB 13.6 13.7 11.7*  HCT 41.3 42.1 36.2  MCV 96.5 96.8 98.9  PLT 459* 475* 378   Cardiac Enzymes: No results for input(s): CKTOTAL, CKMB, CKMBINDEX, TROPONINI in the last 168 hours. BNP: Invalid input(s): POCBNP CBG:  Recent Labs Lab 01/19/17 0638  GLUCAP 194*   D-Dimer No results for input(s): DDIMER in the last 72 hours. Hgb A1c No results for input(s): HGBA1C in the last 72 hours. Lipid Profile No results for input(s): CHOL, HDL, LDLCALC, TRIG, CHOLHDL, LDLDIRECT in the last 72 hours. Thyroid function studies No results for input(s): TSH, T4TOTAL, T3FREE, THYROIDAB in the last 72 hours.  Invalid input(s): FREET3 Anemia work up No results for input(s): VITAMINB12, FOLATE, FERRITIN, TIBC, IRON, RETICCTPCT in the last 72 hours. Microbiology Recent Results (from the past 240 hour(s))  Surgical pcr screen     Status: None   Collection Time: 01/20/17  1:43 PM  Result Value Ref Range Status   MRSA, PCR NEGATIVE NEGATIVE Final   Staphylococcus aureus NEGATIVE NEGATIVE Final    Comment:        The Xpert  SA Assay (FDA approved for NASAL specimens in patients over 60 years of age), is one component of a comprehensive surveillance program.  Test performance has been validated by [facility] for patients greater than or equal to 49 year old. It is not intended to diagnose infection nor to guide or monitor treatment.    PATHOLOGY REPORT: Diagnosis 01/21/2017 1. Lung, biopsy, Left Upper Lobe - NECROTIC MATERIAL. 2. Lung, biopsy, Left Upper Lobe #2 - SMALL CELL CARCINOMA. - SEE COMMENT. 3. Lung, biopsy, Left Upper Lobe #3 - SMALL CELL CARCINOMA. - SEE COMMENT. 4. Lung, biopsy, Left Upper Lobe #4 - SMALL CELL CARCINOMA. - SEE COMMENT.   Studies:  Dg Chest 2 View  Result Date: 01/21/2017 CLINICAL DATA:  Known lung mass.  Chest pain. EXAM: CHEST  2 VIEW COMPARISON:  CT scan January 19, 2017 FINDINGS: The known left upper lobe lung mass is identified, extending into the left hilum. No other nodules or masses. No evidence of pneumonia. No pneumothorax. The heart, right hilum, and mediastinum are normal. The left hilum is obscured by the known mass. IMPRESSION: Known left upper lobe lung mass, better characterized on CT imaging. No other abnormalities. Electronically Signed   By: Dorise Bullion III M.D   On: 01/21/2017 09:44   Dg Chest Port 1 View  Result Date: 01/21/2017 CLINICAL DATA:  Post bronchoscopy. EXAM: PORTABLE CHEST 1 VIEW COMPARISON:  Chest radiograph January 21, 2017 at 0937 hours FINDINGS: Large LEFT hilar/ upper lobe mass, no pneumothorax. Increased lung volumes with mild chronic interstitial changes most compatible with COPD. No pleural effusion. LEFT proximal humerus presumed bone infarct. IMPRESSION: LEFT hilar/ upper lobe mass, COPD.  No pneumothorax. Electronically Signed   By: Elon Alas M.D.   On: 01/21/2017 18:33    Assessment: 46 y.o. female with 20 PY smoking history, presented with headaches and difficulty on performing routine jobs, fatigue and weight loss.   1.  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 Brain mets with vasogenic edema, and midline shift, on dexamethasone 3. History of anxiety and panic attack   Recommendations: 1. Left lung mass biopsy showed small cell carcinoma, I discussed the biopsy results with patient and her daughter in details. We reviewed  the aggressive nature of small cell lung cancer, and incurable disease due to her brain metastasis. 2. Neurosurgeon Dr. Christella Noa does not recommend craniotomy due to small cell cancer 3. I have spoken with rad/onc PA, Bryson Ha will see pt and likely start her brain radiation soon.  4. Due to her limited social support (daughter and son live out of state, she has a friend/roommate will be able to help), and severity of her brain mets with significant vasogenic edema and midline shift, we'll likely transfer her to [place] to start brain radiation, continue and complete as outpatient. 5. I will consider systemic chemotherapy with carboplatin and etoposide after she completes brain radiation 6. I'll inform our social worker in [place] to help her transportation and social support during his treatment. 7. I will follow up     Truitt Merle, MD 01/22/2017  1:35 PM

## 2017-01-22 NOTE — Progress Notes (Addendum)
      [address]       [place] [address]             [phone]       1 Day Post-Op Procedure(s) (LRB): VIDEO BRONCHOSCOPY (N/A) LUNG BIOPSY LEFT UPPER LOBE (Left)  Subjective: She says her throat is a little sore this am and she is hungry and wants food.  Objective: Vital signs in last 24 hours: Temp:  [98 F (36.7 C)-99.8 F (37.7 C)] 98 F (36.7 C) (03/06 0550) Pulse Rate:  [43-132] 43 (03/06 0619) Cardiac Rhythm: Sinus bradycardia (03/05 1730) Resp:  [16-19] 18 (03/06 0550) BP: (101-122)/(50-77) 119/63 (03/06 0619) SpO2:  [92 %-98 %] 98 % (03/06 0619)      Intake/Output from previous day: 03/05 0701 - 03/06 0700 In: 2745.3 [I.V.:2745.3] Out: 230 [Urine:200; Blood:30]   Physical Exam:  Cardiovascular: Bradycardic Pulmonary: Clear on right and slightly diminished on left    Lab Results: CBC: Recent Labs  01/21/17 0513  WBC 12.8*  HGB 11.7*  HCT 36.2  PLT 378   BMET:  Recent Labs  01/21/17 0513  NA 138  K 4.1  CL 107  CO2 26  GLUCOSE 135*  BUN 14  CREATININE 0.56  CALCIUM 9.1    PT/INR:  Recent Labs  01/21/17 0513  LABPROT 13.6  INR 1.04    Assessment/Plan:  1.  Pulmonary - S/p bronchscopy and LUL lung biopsy. Await pathology results. CXR yesterday was stable-no pneumothorax. 2. Management per primary.  ZIMMERMAN,DONIELLE MPA-C 01/22/2017,8:22 AM  Final path pending  Oncology has been consulted  Please call for further assistance  I have seen and examined Erika Hunter and agree with the above assessment  and plan.  Grace Isaac MD Beeper [phone] Office [phone] 01/22/2017 10:25 AM

## 2017-01-23 ENCOUNTER — Telehealth: Payer: Self-pay | Admitting: *Deleted

## 2017-01-23 ENCOUNTER — Encounter: Payer: Self-pay | Admitting: Radiation Oncology

## 2017-01-23 ENCOUNTER — Ambulatory Visit
Admission: RE | Admit: 2017-01-23 | Discharge: 2017-01-23 | Disposition: A | Discharge status: Home / Self Care | Payer: Medicaid Other | Source: Ambulatory Visit | Attending: Radiation Oncology | Admitting: Radiation Oncology

## 2017-01-23 ENCOUNTER — Encounter (HOSPITAL_COMMUNITY): Payer: Self-pay | Admitting: Internal Medicine

## 2017-01-23 VITALS — BP 127/75 | HR 48 | Temp 98.8°F | Resp 16

## 2017-01-23 DIAGNOSIS — C7931 Secondary malignant neoplasm of brain: Secondary | ICD-10-CM | POA: Insufficient documentation

## 2017-01-23 DIAGNOSIS — Z510 Encounter for antineoplastic radiation therapy: Secondary | ICD-10-CM | POA: Insufficient documentation

## 2017-01-23 MED ORDER — SCOPOLAMINE 1 MG/3DAYS TD PT72
MEDICATED_PATCH | TRANSDERMAL | Status: AC
  Filled 2017-01-23: qty 1

## 2017-01-23 NOTE — Telephone Encounter (Signed)
error 

## 2017-01-23 NOTE — Progress Notes (Signed)
Patient ID: Erika Hunter, female   DOB: [birth year], 45 y.o.   MRN: [id number] Oncology does not transfer patients. I have consulted the hospitalists in the hope that they will transfer [name] so she can continue to receive care.  There are no neurosurgical issues at this time, only oncologic.  Alert, follows commands Moving all extremities Stable exam.
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
## 2017-01-23 NOTE — Telephone Encounter (Signed)
Called [facility] spoke with Marden Noble, dispatcher,- [phone] patien tis ready to go back to [facility], per [facility], we will get someone to come get her as soon as we can 11:46 AM

## 2017-01-23 NOTE — Telephone Encounter (Signed)
Patient completed ct simulation whole brain , back via stretcher to nursing dept room 1, vitals taken, no c/o pain, patient drowsy, answers questions appropriately, no nausea, daughter Erika Hunter at patient's side, IVF's infusing right forearm, intact, no redness at site, warm blanket placed over patient x 2, T.V. On , call bell with in reach,daughter and patient knows to call for any thing while waiting for [facility] transportation 12:03 PM

## 2017-01-23 NOTE — Progress Notes (Signed)
The patient's case was discussed this morning with radiation and medical oncology services. We will proceed in offering whole brain radiotherapy and palliative radiotherapy due to concerns of mediastinal invasion. I will contact the patient's daughter to communicate our interest in treating her chest as well. Dr. Burr Medico does not take patients in transfer, nor does our service; so I will contact internal medicine at [facility] to determine if they will accept a transfer of care as well as transfer to [facility] We anticipate starting treatment tomorrow.     Carola Rhine, PAC

## 2017-01-23 NOTE — Telephone Encounter (Signed)
Called 5[facility] spoke with Helene Kelp RN, [facility] is here to transport patient back to [facility], patient tolerated cr simulation well, no c/o pain, n ausea 12:25 PM
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
## 2017-01-23 NOTE — Progress Notes (Addendum)
Patient return via [facility] and Transferred back to her bed, assisted to Restroom and voided,  IV running,  bed alarm set on middle setting  And call bell within reach.  Also spoke to Dr Christella Noa OR phone assistant about transfer orders to move patient to [facility] for ongoing treatments, and the need to consult accepting internal medicine doctor at [facility].

## 2017-01-23 NOTE — Telephone Encounter (Signed)
Called [facility] 5W for bed 20-C, spoke with RN Davy Pique, gave her [facility] phone number#[phone], for her or Charge RN to arrange [facility] from [facility] to have patient transported to [facility] to [place] to be her at 1100am, for 1 hour CT Simulation of whole brain,and asked RN to medicate patient for pain or anxiety if needed before transporting patient to here. Sonya RN Stated she would give report to oncoming RN , asked RN to either she or charge nurse call [facility] now as they need to know asap,  7:40 AM

## 2017-01-23 NOTE — Progress Notes (Signed)
Paged provider Caddell to verify if pt needs to be placed on tele. Waiting for call back from MD. Will continue to monitor pt.

## 2017-01-24 ENCOUNTER — Other Ambulatory Visit: Payer: Self-pay

## 2017-01-24 ENCOUNTER — Encounter: Payer: Self-pay | Admitting: *Deleted

## 2017-01-24 ENCOUNTER — Telehealth: Payer: Self-pay | Admitting: *Deleted

## 2017-01-24 ENCOUNTER — Encounter (HOSPITAL_COMMUNITY): Payer: Self-pay | Admitting: Internal Medicine

## 2017-01-24 ENCOUNTER — Ambulatory Visit
Admission: RE | Admit: 2017-01-24 | Discharge: 2017-01-24 | Disposition: A | Discharge status: Home / Self Care | Payer: Medicaid Other | Source: Ambulatory Visit | Attending: Radiation Oncology | Admitting: Radiation Oncology

## 2017-01-24 DIAGNOSIS — F1029 Alcohol dependence with unspecified alcohol-induced disorder: Secondary | ICD-10-CM

## 2017-01-24 DIAGNOSIS — F411 Generalized anxiety disorder: Secondary | ICD-10-CM

## 2017-01-24 DIAGNOSIS — J449 Chronic obstructive pulmonary disease, unspecified: Secondary | ICD-10-CM

## 2017-01-24 DIAGNOSIS — R630 Anorexia: Secondary | ICD-10-CM

## 2017-01-24 DIAGNOSIS — R51 Headache: Secondary | ICD-10-CM

## 2017-01-24 DIAGNOSIS — C3412 Malignant neoplasm of upper lobe, left bronchus or lung: Principal | ICD-10-CM

## 2017-01-24 DIAGNOSIS — R519 Headache, unspecified: Secondary | ICD-10-CM | POA: Diagnosis present

## 2017-01-24 DIAGNOSIS — F1020 Alcohol dependence, uncomplicated: Secondary | ICD-10-CM

## 2017-01-24 HISTORY — DX: Alcohol dependence, uncomplicated: F10.20

## 2017-01-24 HISTORY — DX: Chronic obstructive pulmonary disease, unspecified: J44.9

## 2017-01-24 LAB — CBC WITH DIFFERENTIAL/PLATELET
Basophils Absolute: 0 10*3/uL (ref 0.0–0.1)
Basophils Relative: 0 %
EOS PCT: 0 %
Eosinophils Absolute: 0 10*3/uL (ref 0.0–0.7)
HCT: 41.4 % (ref 36.0–46.0)
Hemoglobin: 13.9 g/dL (ref 12.0–15.0)
LYMPHS ABS: 1.6 10*3/uL (ref 0.7–4.0)
Lymphocytes Relative: 15 %
MCH: 31.7 pg (ref 26.0–34.0)
MCHC: 33.6 g/dL (ref 30.0–36.0)
MCV: 94.5 fL (ref 78.0–100.0)
MONO ABS: 1.1 10*3/uL — AB (ref 0.1–1.0)
MONOS PCT: 10 %
Neutro Abs: 8.2 10*3/uL — ABNORMAL HIGH (ref 1.7–7.7)
Neutrophils Relative %: 75 %
PLATELETS: 370 10*3/uL (ref 150–400)
RBC: 4.38 MIL/uL (ref 3.87–5.11)
RDW: 12.4 % (ref 11.5–15.5)
WBC: 10.9 10*3/uL — ABNORMAL HIGH (ref 4.0–10.5)

## 2017-01-24 LAB — BASIC METABOLIC PANEL
ANION GAP: 7 (ref 5–15)
Anion gap: 7 (ref 5–15)
BUN: 19 mg/dL (ref 6–20)
BUN: 29 mg/dL — ABNORMAL HIGH (ref 6–20)
CALCIUM: 9.1 mg/dL (ref 8.9–10.3)
CHLORIDE: 101 mmol/L (ref 101–111)
CO2: 29 mmol/L (ref 22–32)
CO2: 29 mmol/L (ref 22–32)
Calcium: 8.9 mg/dL (ref 8.9–10.3)
Chloride: 101 mmol/L (ref 101–111)
Creatinine, Ser: 0.52 mg/dL (ref 0.44–1.00)
Creatinine, Ser: 0.53 mg/dL (ref 0.44–1.00)
GFR calc Af Amer: >60 mL/min (ref >60)
GFR calc Af Amer: >60 mL/min (ref >60)
GFR calc non Af Amer: >60 mL/min (ref >60)
GFR calc non Af Amer: >60 mL/min (ref >60)
GLUCOSE: 102 mg/dL — AB (ref 65–99)
GLUCOSE: 110 mg/dL — AB (ref 65–99)
POTASSIUM: 4.2 mmol/L (ref 3.5–5.1)
Potassium: 3.9 mmol/L (ref 3.5–5.1)
Sodium: 137 mmol/L (ref 135–145)
Sodium: 137 mmol/L (ref 135–145)

## 2017-01-24 LAB — CBC
HEMATOCRIT: 42.2 % (ref 36.0–46.0)
HEMOGLOBIN: 14.2 g/dL (ref 12.0–15.0)
MCH: 31.3 pg (ref 26.0–34.0)
MCHC: 33.6 g/dL (ref 30.0–36.0)
MCV: 93.2 fL (ref 78.0–100.0)
Platelets: 400 10*3/uL (ref 150–400)
RBC: 4.53 MIL/uL (ref 3.87–5.11)
RDW: 12.4 % (ref 11.5–15.5)
WBC: 10.1 10*3/uL (ref 4.0–10.5)

## 2017-01-24 LAB — MAGNESIUM: Magnesium: 1.9 mg/dL (ref 1.7–2.4)

## 2017-01-24 MED ORDER — IPRATROPIUM-ALBUTEROL 0.5-2.5 (3) MG/3ML IN SOLN: 3.0000 mL | RESPIRATORY_TRACT | Status: DC | PRN

## 2017-01-24 MED ORDER — TIOTROPIUM BROMIDE MONOHYDRATE 18 MCG IN CAPS
18.0000 ug | ORAL_CAPSULE | Freq: Every day | RESPIRATORY_TRACT | Status: DC
  Filled 2017-01-24 (×3): qty 5

## 2017-01-24 MED ORDER — ENOXAPARIN SODIUM 40 MG/0.4ML ~~LOC~~ SOLN
40.0000 mg | SUBCUTANEOUS | Status: DC
  Administered 2017-01-25 – 2017-01-31 (×7): 40 mg via SUBCUTANEOUS
  Filled 2017-01-24 (×7): qty 0.4

## 2017-01-24 MED ORDER — PANTOPRAZOLE SODIUM 40 MG PO TBEC
40.0000 mg | DELAYED_RELEASE_TABLET | Freq: Every day | ORAL | Status: DC
  Administered 2017-01-25 – 2017-01-31 (×7): 40 mg via ORAL
  Filled 2017-01-24 (×6): qty 1

## 2017-01-24 MED ORDER — MOMETASONE FURO-FORMOTEROL FUM 100-5 MCG/ACT IN AERO
2.0000 | INHALATION_SPRAY | Freq: Two times a day (BID) | RESPIRATORY_TRACT | Status: DC
  Administered 2017-01-25 – 2017-01-26 (×2): 2 via RESPIRATORY_TRACT
  Filled 2017-01-24 (×3): qty 8.8

## 2017-01-24 NOTE — Progress Notes (Signed)
Erika Hunter   DOB:[birth year]   XF#:[id number]   IRC#:[id number]  Oncology follow-up note  Subjective: The patient was transferred from [facility] to [place] today, had first session of whole brain radiation today, and is very fatigued, lethargic now. She reports no problems with her breathing. The patient's daughter is present and helps to provide some of the patient's history. She reports the patient has no appetite, and "feels sick." The patient is incontinent.    Objective:  Vitals:   01/24/17 0544 01/24/17 1245  BP: 139/74 100/66  Pulse: (!) 52 (!) 48  Resp: 17 16  Temp: 98.1 F (36.7 C) 98.6 F (37 C)    Body mass index is 19.62 kg/m.  Intake/Output Summary (Last 24 hours) at 01/24/17 1814 Last data filed at 01/24/17 5102  Gross per 24 hour  Intake              912 ml  Output             1900 ml  Net             -988 ml     Sclerae unicteric  Oropharynx clear  No peripheral adenopathy  Lungs clear -- no rales or rhonchi  Heart regular rate and rhythm  Abdomen benign  MSK no focal spinal tenderness, no peripheral edema    CBG (last 3)  No results for input(s): GLUCAP in the last 72 hours.   Labs:  Lab Results  Component Value Date   WBC 10.1 01/24/2017   HGB 14.2 01/24/2017   HCT 42.2 01/24/2017   MCV 93.2 01/24/2017   PLT 400 01/24/2017   NEUTROABS 8.2 (H) 01/24/2017    CMP Latest Ref Rng & Units 01/24/2017 01/24/2017 01/21/2017  Glucose 65 - 99 mg/dL 110(H) 102(H) 135(H)  BUN 6 - 20 mg/dL 29(H) 19 14  Creatinine 0.44 - 1.00 mg/dL 0.53 0.52 0.56  Sodium 135 - 145 mmol/L 137 137 138  Potassium 3.5 - 5.1 mmol/L 3.9 4.2 4.1  Chloride 101 - 111 mmol/L 101 101 107  CO2 22 - 32 mmol/L '29 29 26  '$ Calcium 8.9 - 10.3 mg/dL 9.1 8.9 9.1  Total Protein 6.5 - 8.1 g/dL - - -  Total Bilirubin 0.3 - 1.2 mg/dL - - -  Alkaline Phos 38 - 126 U/L - - -  AST 15 - 41 U/L - - -  ALT 14 - 54 U/L - - -    Urine Studies No results for input(s): UHGB, CRYS in the last 72  hours.  Invalid input(s): UACOL, UAPR, USPG, UPH, UTP, UGL, [place], UBIL, UNIT, UROB, [place], UEPI, UWBC, Duwayne Heck [place], [place]  Basic Metabolic Panel:  Recent Labs Lab 01/18/17 1951 01/18/17 2300 01/21/17 0513 01/24/17 0852 01/24/17 1048 01/24/17 1412  NA 140  --  138 137  --  137  K 3.8  --  4.1 4.2  --  3.9  CL 102  --  107 101  --  101  CO2 27  --  26 29  --  29  GLUCOSE 106*  --  135* 102*  --  110*  BUN 8  --  14 19  --  29*  CREATININE 0.56 0.62 0.56 0.52  --  0.53  CALCIUM 10.0  --  9.1 8.9  --  9.1  MG  --   --   --   --  1.9  --    GFR Estimated Creatinine Clearance: 73.4 mL/min (by  C-G formula based on SCr of 0.53 mg/dL). Liver Function Tests:  Recent Labs Lab 01/18/17 1951  AST 18  ALT 15  ALKPHOS 65  BILITOT 0.4  PROT 7.2  ALBUMIN 3.4*   No results for input(s): LIPASE, AMYLASE in the last 168 hours. No results for input(s): AMMONIA in the last 168 hours. Coagulation profile  Recent Labs Lab 01/18/17 2300 01/21/17 0513  INR 1.00 1.04    CBC:  Recent Labs Lab 01/18/17 1951 01/18/17 2300 01/21/17 0513 01/24/17 0852 01/24/17 1412  WBC 10.7* 12.8* 12.8* 10.9* 10.1  NEUTROABS 8.1*  --   --  8.2*  --   HGB 13.6 13.7 11.7* 13.9 14.2  HCT 41.3 42.1 36.2 41.4 42.2  MCV 96.5 96.8 98.9 94.5 93.2  PLT 459* 475* 378 370 400   Cardiac Enzymes: No results for input(s): CKTOTAL, CKMB, CKMBINDEX, TROPONINI in the last 168 hours. BNP: Invalid input(s): POCBNP CBG:  Recent Labs Lab 01/19/17 0638  GLUCAP 194*   D-Dimer No results for input(s): DDIMER in the last 72 hours. Hgb A1c No results for input(s): HGBA1C in the last 72 hours. Lipid Profile No results for input(s): CHOL, HDL, LDLCALC, TRIG, CHOLHDL, LDLDIRECT in the last 72 hours. Thyroid function studies No results for input(s): TSH, T4TOTAL, T3FREE, THYROIDAB in the last 72 hours.  Invalid input(s): FREET3 Anemia work up No results for input(s): VITAMINB12, FOLATE, FERRITIN,  TIBC, IRON, RETICCTPCT in the last 72 hours. Microbiology Recent Results (from the past 240 hour(s))  Surgical pcr screen     Status: None   Collection Time: 01/20/17  1:43 PM  Result Value Ref Range Status   MRSA, PCR NEGATIVE NEGATIVE Final   Staphylococcus aureus NEGATIVE NEGATIVE Final    Comment:        The Xpert SA Assay (FDA approved for NASAL specimens in patients over 59 years of age), is one component of a comprehensive surveillance program.  Test performance has been validated by [facility] for patients greater than or equal to 58 year old. It is not intended to diagnose infection nor to guide or monitor treatment.       Studies:  No results found.  Assessment: 46 y.o. female with 20 PY smoking history, presented with headaches and difficulty on performingroutine jobs, fatigue and weight loss.   1. 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 Multiple brain mets (3) with vasogenic edema, and midline shift, on dexamethasone 3. History of anxiety and panic attack  4. Anorexia   Recommendations:  -She has started WBRT today. -her overall condition seems to be getting worse, not eating, more lethargic, I suggest pt's daughter to encourage her to eat -continue dexa 6 mg every 6 hours -PT evaluation tomorrow, and plan for discharge. Due to the care she needs, she may need to go to a rehabilitation facility -I will have Social Work follow up to assist the patient and her family about transportation. -continue supportive care  -I will follow up.  This document serves as a record of services personally performed by Truitt Merle, MD. It was created on her behalf by Maryla Morrow, a trained medical scribe. The creation of this record is based on the scribe's personal observations and the provider's statements to them. This document has been checked and approved by the attending provider.   Truitt Merle, MD 01/24/2017  6:14 PM

## 2017-01-24 NOTE — Telephone Encounter (Signed)
Called 5W-20 bed, spoke with RN Henrene Pastor, asked if patient was being trasnferred to [facility] long hospital today and if not need to arrange [facility] transportation,patient appt treatment time today is at 5pm, per Helene Kelp RN", she has a bed at [place], was going to be transferred last night,but patient was sleeping, will transfer her today around 9am", thanked Rn, let [facility]3 know of transfer today to 1517,spoke with Anderson Malta 7:51 AM

## 2017-01-24 NOTE — Progress Notes (Signed)
CSW consulted regarding transportation needs. Patient being transported by [facility], but patient's daughter has no way to get to [place] to meet patient with belongings. CSW provided cab vouher.  CSW signing off.  Percell Locus Merl Guardino LCSWA [phone]

## 2017-01-24 NOTE — Progress Notes (Signed)
Called report to nurse Cyndi on [place]. Nurse  Notified to call back with any questions. Pt and daughter made aware of new room on 3w23.

## 2017-01-24 NOTE — Telephone Encounter (Signed)
Per RN Helene Kelp, patient should be picked up by [facility] soon to transfer her to [facility],, called the secretaery at [place] will let [facility] know to bring the patient to cancer center first called melanie patient to let know [facility] will bring patient over here to radiation first, then will take patient to 1517 10:26 AM 10:23 AM

## 2017-01-24 NOTE — Progress Notes (Signed)
[facility] to pick up patient and transfer to [place] cancer center.

## 2017-01-24 NOTE — Progress Notes (Signed)
Patient received from [facility] via XRT at [facility] for WBRT. Patient very drowsy, alert to self & aware she is in the hospital. VS stable skin WDI. Will continue to monitor.

## 2017-01-24 NOTE — Progress Notes (Signed)
[place] Work  Clinical Social Work was referred by Futures trader for assessment of psychosocial needs and assistance with transportation to treatments once pt is discharged.  Clinical Social Worker attempted to contact patient's daughter via phone to offer support and assess for needs. CSW left voicemail message and will attempt to meet with pt and family directly at radiation treatment tomorrow. [place] also concerned about disposition after inpatient stay due to limited local support.   Loren Racer, LCSW, OSW-C Clinical Social Worker [place]  [place] Phone: [phone] Fax: [phone]

## 2017-01-24 NOTE — Progress Notes (Signed)
Called 5west patient will be transferred to [place] instead of 5E, [facility] is there now picking the patient up and will transport her to radiation first and will call us the patient' s room number, called and spoke with Ciara, RT therapist, gave updated staus 11:34 AM

## 2017-01-24 NOTE — Telephone Encounter (Signed)
error 

## 2017-01-24 NOTE — Progress Notes (Addendum)
Notified Dr. Lorin Mercy that pt's daughter doesn't want pt transferred to [place] till 9:00-9:30am in the morning. Daughter states this is the first night her mother has slept in many nights and doesn't want her bothered. Told daughter that Dr. Lorin Mercy recommending for pt to be transferred tonight so treatment will not be delayed in the morning at [facility]. Explained this to daughter and still wants to wait till the morning between 9-9:30 to be transferred. Notified Dr. Lorin Mercy via text page of daughter's decision not to move tonight. Notified [facility] and pt placement that pt will not be transferred till the morning. Will continue to monitor pt. Ranelle Oyster, RN
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
## 2017-01-24 NOTE — Care Management Note (Signed)
Case Management Note  Patient Details  Name: EARLEE HERALD MRN: [id number] Date of Birth: October 01, 1971  Subjective/Objective:       Admitted with brain tumor. From home with roommate. Pt's daughter, Balducci, from [place] while in hospital. Independent with ADL's and no DME usage PTA.           Velera Lansdale (Daughter)     [phone]      PCP: Reginia Forts  Action/Plan: Plan to transfer to [facility] [place] for brain and palliative radiotherapy.  Expected Discharge Date:                  Expected Discharge Plan:  Home/Self Care  In-House Referral:  Clinical Social Work, Development worker, community  Discharge planning Services  CM Consult, [facility]  Post Acute Care Choice:    Choice offered to:     DME Arranged:    DME Agency:     HH Arranged:    [place] Agency:     Status of Service:  In process, will continue to follow  If discussed at Long Length of Stay Meetings, dates discussed:    Additional Comments:  Sharin Mons, RN 01/24/2017, 11:21 AM

## 2017-01-25 ENCOUNTER — Encounter: Payer: Self-pay | Admitting: Radiation Oncology

## 2017-01-25 ENCOUNTER — Ambulatory Visit
Admit: 2017-01-25 | Discharge: 2017-01-25 | Disposition: A | Discharge status: Home / Self Care | Payer: Medicaid Other | Attending: Radiation Oncology | Admitting: Radiation Oncology

## 2017-01-25 DIAGNOSIS — Z515 Encounter for palliative care: Secondary | ICD-10-CM

## 2017-01-25 DIAGNOSIS — Z7189 Other specified counseling: Secondary | ICD-10-CM

## 2017-01-25 DIAGNOSIS — C3492 Malignant neoplasm of unspecified part of left bronchus or lung: Secondary | ICD-10-CM

## 2017-01-25 LAB — CBC
HEMATOCRIT: 42.3 % (ref 36.0–46.0)
HEMOGLOBIN: 14.2 g/dL (ref 12.0–15.0)
MCH: 31.3 pg (ref 26.0–34.0)
MCHC: 33.6 g/dL (ref 30.0–36.0)
MCV: 93.2 fL (ref 78.0–100.0)
Platelets: 398 10*3/uL (ref 150–400)
RBC: 4.54 MIL/uL (ref 3.87–5.11)
RDW: 12.6 % (ref 11.5–15.5)
WBC: 11.8 10*3/uL — ABNORMAL HIGH (ref 4.0–10.5)

## 2017-01-25 LAB — BASIC METABOLIC PANEL
ANION GAP: 8 (ref 5–15)
BUN: 28 mg/dL — ABNORMAL HIGH (ref 6–20)
CO2: 29 mmol/L (ref 22–32)
Calcium: 9.3 mg/dL (ref 8.9–10.3)
Chloride: 101 mmol/L (ref 101–111)
Creatinine, Ser: 0.52 mg/dL (ref 0.44–1.00)
GLUCOSE: 122 mg/dL — AB (ref 65–99)
POTASSIUM: 4 mmol/L (ref 3.5–5.1)
Sodium: 138 mmol/L (ref 135–145)

## 2017-01-25 LAB — MAGNESIUM: Magnesium: 2.1 mg/dL (ref 1.7–2.4)

## 2017-01-25 MED ORDER — BIAFINE EX EMUL
Freq: Every day | CUTANEOUS | Status: DC
  Administered 2017-01-25: 12:00:00 via TOPICAL

## 2017-01-25 MED ORDER — SODIUM CHLORIDE 0.9 % IV SOLN
INTRAVENOUS | Status: DC
  Administered 2017-01-25 – 2017-01-28 (×6): via INTRAVENOUS

## 2017-01-25 MED ORDER — SODIUM CHLORIDE 0.9 % IV BOLUS (SEPSIS)
1000.0000 mL | Freq: Once | INTRAVENOUS | Status: AC
  Administered 2017-01-25: 1000 mL via INTRAVENOUS

## 2017-01-25 NOTE — Progress Notes (Signed)
patient education, radiation therapy and you book, my business card given to patient, marked pages of fatigue, skin irritation, nausea, thorat changes, patient to have 10 treatments to brain and left chest, should only have mild side effects, patient alert, but will need to reinforce as needed, can discuss with daughter if needed,  Patient stated okay but not sure if she really understood 12:08 PM

## 2017-01-25 NOTE — Progress Notes (Signed)
PROGRESS NOTE    Erika Hunter   OIN:[id number]  DOB: [birth year]  DOA: 01/18/2017 PCP: Reginia Forts, MD   Brief Narrative:  Erika Hunter an 46 y.o.female with COPD, smoking, ETOH abusewho presented to [facility] with headaches, memory loss, confusion. No vision problems. 40-60 pounds unintentional weight loss over 4-6 months. Some night sweats. Slight wheezing, perhaps some cough for the last few years.     In the ER, CT head was worrisome for brain metastases and patient initially admitted under the neurosurgical team and placed on Decadron. Oncology and CT surgery were consulted. CT chest showed LUL mass. Patient underwent bronchoscopy which was positive for small cell lung cancer.. Patient was deemed no longer craniotomy candidate, transferred to hospitalist service and then transferred to [facility] for initiation of brain radiation and possible systemic chemotherapy.   Subjective: Laying bed with eyes closed. Tells me she is tired and then turns around. Will not open eyes. Tells me repeatedly that she is tired and has no other complaints. Daughter states patient did not eat breakfast today.   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 3/3 CT chest >> LUL lung mass 01/21/17 - biopsy by Dr Servando Snare >> small cell lung CA - evaluated by Dr Burr Medico - 3/8 transferred to [facility] long for radiaition - cont Decadron, obtain palliative care eval in patient who appears depressed,taking meds intermittently in the hospital and is not eating- daughter is POA  Anorexia - cont slow IVF to prevent dehydration    COPD (chronic obstructive pulmonary disease) /   Tobacco user - has Albuterol inhaler listed on home meds - on Dulera, Nicotine patch and PRN nebs here  Anxiety/ depression?  - per home med list, takes, Xanax  and Effexor at home     Alcohol dependence   - per history - no cirrhosis noted on CT abd/pelvis on 3/3    DVT prophylaxis: Lovenox Code Status: Full code Family Communication: daughter Disposition Plan: need discussion with palliative care to address [place] Consultants:   Initially admitted under neurosurgery Dr. Cyndy Freeze  Radiation oncology, Worthy Flank, PA 01/22/2017  Oncology: Dr. Burr Medico 01/20/2017  CVTS: Dr Gerhardt/Dr. Lucianne Lei tright 01/20/2017  [facility] hospitalists: Dr. Lorin Mercy 01/23/2017 Procedures:   Bronchoscopy with brushings and biopsy of left upper lobe mass per Dr.Gerhardt 01/21/2017 Antimicrobials:  Anti-infectives    None       Objective: Vitals:   01/24/17 0544 01/24/17 1245 01/24/17 2022 01/25/17 0505  BP: 139/74 100/66 121/69 125/72  Pulse: (!) 52 (!) 48 (!) 45 (!) 52  Resp: '17 16 18 16  '$ Temp: 98.1 F (36.7 C) 98.6 F (37 C) 98.6 F (37 C) 98.4 F (36.9 C)  TempSrc: Oral Oral Oral Oral  SpO2: 97% 98% 99% 98%  Weight:      Height:       No intake or output data in the 24 hours ending 01/25/17 1222 Filed Weights   01/18/17 1737 01/19/17 0357  Weight: 49.9 kg (110 lb) 53.5 kg (117 lb 14.4 oz)    Examination: General exam: Appears comfortable  HEENT: PERRLA, oral mucosa moist, no sclera icterus or thrush Respiratory system: Clear to auscultation. Respiratory effort normal. Cardiovascular system: S1 & S2 heard, RRR.  No murmurs  Gastrointestinal system: Abdomen soft, non-tender, nondistended. Normal bowel sound. No organomegaly Central nervous system:  Alert and oriented. No focal neurological deficits. Extremities: No cyanosis, clubbing or edema Skin: No rashes or ulcers Psychiatry:  depressed    Data Reviewed: I have personally reviewed following labs and imaging studies  CBC:  Recent Labs Lab 01/18/17 1951 01/18/17 2300 01/21/17 0513 01/24/17 0852 01/24/17 1412 01/25/17 0419  WBC 10.7* 12.8* 12.8* 10.9* 10.1 11.8*  NEUTROABS 8.1*  --    --  8.2*  --   --   HGB 13.6 13.7 11.7* 13.9 14.2 14.2  HCT 41.3 42.1 36.2 41.4 42.2 42.3  MCV 96.5 96.8 98.9 94.5 93.2 93.2  PLT 459* 475* 378 370 400 161   Basic Metabolic Panel:  Recent Labs Lab 01/18/17 1951 01/18/17 2300 01/21/17 0513 01/24/17 0852 01/24/17 1048 01/24/17 1412 01/25/17 0419  NA 140  --  138 137  --  137 138  K 3.8  --  4.1 4.2  --  3.9 4.0  CL 102  --  107 101  --  101 101  CO2 27  --  26 29  --  29 29  GLUCOSE 106*  --  135* 102*  --  110* 122*  BUN 8  --  14 19  --  29* 28*  CREATININE 0.56 0.62 0.56 0.52  --  0.53 0.52  CALCIUM 10.0  --  9.1 8.9  --  9.1 9.3  MG  --   --   --   --  1.9  --  2.1   GFR: Estimated Creatinine Clearance: 73.4 mL/min (by C-G formula based on SCr of 0.52 mg/dL). Liver Function Tests:  Recent Labs Lab 01/18/17 1951  AST 18  ALT 15  ALKPHOS 65  BILITOT 0.4  PROT 7.2  ALBUMIN 3.4*   No results for input(s): LIPASE, AMYLASE in the last 168 hours. No results for input(s): AMMONIA in the last 168 hours. Coagulation Profile:  Recent Labs Lab 01/18/17 2300 01/21/17 0513  INR 1.00 1.04   Cardiac Enzymes: No results for input(s): CKTOTAL, CKMB, CKMBINDEX, TROPONINI in the last 168 hours. BNP (last 3 results) No results for input(s): PROBNP in the last 8760 hours. HbA1C: No results for input(s): HGBA1C in the last 72 hours. CBG:  Recent Labs Lab 01/19/17 0638  GLUCAP 194*   Lipid Profile: No results for input(s): CHOL, HDL, LDLCALC, TRIG, CHOLHDL, LDLDIRECT in the last 72 hours. Thyroid Function Tests: No results for input(s): TSH, T4TOTAL, FREET4, T3FREE, THYROIDAB in the last 72 hours. Anemia Panel: No results for input(s): VITAMINB12, FOLATE, FERRITIN, TIBC, IRON, RETICCTPCT in the last 72 hours. Urine analysis:    Component Value Date/Time   COLORURINE YELLOW 01/20/2017 1558   APPEARANCEUR CLOUDY (A) 01/20/2017 1558   LABSPEC 1.019 01/20/2017 1558   PHURINE 7.0 01/20/2017 1558   GLUCOSEU NEGATIVE  01/20/2017 1558   HGBUR NEGATIVE 01/20/2017 1558   BILIRUBINUR NEGATIVE 01/20/2017 1558   BILIRUBINUR neg 08/24/2014 1703   KETONESUR NEGATIVE 01/20/2017 1558   PROTEINUR NEGATIVE 01/20/2017 1558   UROBILINOGEN 0.2 08/24/2014 1703   NITRITE NEGATIVE 01/20/2017 1558   LEUKOCYTESUR NEGATIVE 01/20/2017 1558   Sepsis Labs: '@LABRCNTIP'$ (procalcitonin:4,lacticidven:4) ) Recent Results (from the past 240 hour(s))  Surgical pcr screen     Status: None   Collection Time: 01/20/17  1:43 PM  Result Value Ref Range Status   MRSA, PCR NEGATIVE NEGATIVE Final   Staphylococcus aureus NEGATIVE NEGATIVE Final    Comment:        The Xpert SA Assay (FDA approved for NASAL specimens in  patients over 34 years of age), is one component of a comprehensive surveillance program.  Test performance has been validated by [facility] for patients greater than or equal to 35 year old. It is not intended to diagnose infection nor to guide or monitor treatment.          Radiology Studies: No results found.    Scheduled Meds: . dexamethasone  6 mg Oral Q6H  . enoxaparin (LOVENOX) injection  40 mg Subcutaneous Q24H  . feeding supplement (PRO-STAT SUGAR FREE 64)  30 mL Oral BID  . folic acid  1 mg Oral Daily  . levETIRAcetam  500 mg Oral BID  . mometasone-formoterol  2 puff Inhalation BID  . multivitamin with minerals  1 tablet Oral Daily  . nicotine  21 mg Transdermal Daily  . pantoprazole  40 mg Oral Q0600  . senna  1 tablet Oral BID  . sodium chloride flush  3 mL Intravenous Q12H  . thiamine  100 mg Oral Daily  . tiotropium  18 mcg Inhalation Daily  . venlafaxine XR  225 mg Oral Q breakfast   Continuous Infusions:   LOS: 7 days    Time spent in minutes: 28    Beatty, MD [facility] Pager: [email or web address] Password [email or web address] 01/25/2017, 12:22 PM

## 2017-01-25 NOTE — Evaluation (Signed)
Physical Therapy Evaluation Patient Details Name: Erika Hunter MRN: [id number] DOB: July 24, 1971 Today's Date: 01/25/2017   History of Present Illness  46 y.o. female  with past medical history of EtOH abuse, COPD, anxiety, and HLD who presented to the ED with worsening headaches. Her daughter notes cognitive deficits concurrently with the onset of headaches. She was subsequently admitted on 01/18/2017 when brain imaging revealed three mass lesions, with associated vasogenic edema . CT chest then showed a large LUL mass encasing the left main pulmonary artery,  invading the left pulmonary veins and left atrium, and extending into the distal left mainstem bronchus.   Clinical Impression  The patient kept eye closed most of the visit . The right leg was noted to tremor during mobility. The patient  Required assist for transfers to  [facility]. Pt admitted with above diagnosis. Pt currently with functional limitations due to the deficits listed below (see PT Problem List).Patient will benefit from skilled PT to increase their independence and safety with mobility to allow discharge to the venue listed below.           Follow Up Recommendations SNF;Supervision/Assistance - 24 hour;Home health PT    Equipment Recommendations   (TBD)    Recommendations for Other Services       Precautions / Restrictions Precautions Precautions: Fall Precaution Comments: has jerking motions at times Restrictions Weight Bearing Restrictions: No      Mobility  Bed Mobility Overal bed mobility: Needs Assistance Bed Mobility: Supine to Sit     Supine to sit: Min assist;Mod assist;+2 for physical assistance Sit to supine: Min guard   General bed mobility comments: assist with legs and trunk to initiate.  Min guard getting back into bed  Transfers Overall transfer level: Needs assistance Equipment used: 1 person hand held assist Transfers: Sit to/from [facility] Sit to Stand: Min assist;+2  safety/equipment Stand pivot transfers: Min assist;+2 safety/equipment       General transfer comment: second person for safety  Ambulation/Gait                Stairs            Wheelchair Mobility    Modified Rankin (Stroke Patients Only)       Balance Overall balance assessment: Needs assistance Sitting-balance support: Feet supported;Bilateral upper extremity supported   Sitting balance - Comments: assist to maintain trunk in sitting due to dizziness     Standing balance-Leahy Scale: Poor                               Pertinent Vitals/Pain Pain Assessment: No/denies pain    Home Living Family/patient expects to be discharged to:: Private residence Living Arrangements: Alone Available Help at Discharge: Family Type of Home: House           Additional Comments: unsure of prior level, daughter in from [place].    Prior Function Level of Independence: Independent         Comments: daughter here visiting from [place]        Extremity/Trunk Assessment   Upper Extremity Assessment Upper Extremity Assessment: Generalized weakness    Lower Extremity Assessment Lower Extremity Assessment: RLE deficits/detail;Generalized weakness RLE Deficits / Details: noted jerking of the right leg when moving to sitting at the bed edge.    Cervical / Trunk Assessment Cervical / Trunk Assessment: Other exceptions Cervical / Trunk Exceptions: pt.  leaning forward and has eyes closed  Communication   Communication: No difficulties (doesn't talk a lot)  Cognition Arousal/Alertness: Lethargic Behavior During Therapy: WFL for tasks assessed/performed Overall Cognitive Status: Impaired/Different from baseline Area of Impairment: Following commands;Problem solving;Memory;Safety/judgement     Memory: Decreased recall of precautions Following Commands: Follows one step commands with increased time Safety/Judgement: Decreased awareness  of safety   Problem Solving: Slow processing;Decreased initiation;Requires verbal cues;Requires tactile cues General Comments: pt oriented to [facility] Comments      Exercises     Assessment/Plan    PT Assessment Patient needs continued PT services  PT Problem List Decreased strength;Decreased activity tolerance;Decreased balance;Decreased mobility;Decreased knowledge of precautions;Decreased cognition;Decreased knowledge of use of DME;Decreased safety awareness       PT Treatment Interventions DME instruction;Gait training;Functional mobility training;Therapeutic activities;Therapeutic exercise;Patient/family education    PT Goals (Current goals can be found in the Care Plan section)  Acute Rehab PT Goals Patient Stated Goal: none stated; agreeable to therapy PT Goal Formulation: With patient/family Time For Goal Achievement: 02/08/17 Potential to Achieve Goals: Fair    Frequency Min 3X/week   Barriers to discharge Decreased caregiver support      Co-evaluation PT/OT/SLP Co-Evaluation/Treatment: Yes Reason for Co-Treatment: Complexity of the patient's impairments (multi-system involvement);For patient/therapist safety PT goals addressed during session: Mobility/safety with mobility OT goals addressed during session: ADL's and self-care       End of Session   Activity Tolerance: Patient limited by fatigue Patient left: in bed;with call bell/phone within reach;with bed alarm set;with family/visitor present Nurse Communication: Mobility status PT Visit Diagnosis: Other abnormalities of gait and mobility (R26.89);Other symptoms and signs involving the nervous system (R29.898);Dizziness and giddiness (R42)         Time: 0300-9233 PT Time Calculation (min) (ACUTE ONLY): 15 min   Charges:   PT Evaluation $PT Eval Moderate Complexity: 1 Procedure     PT G CodesClaretha Cooper 01/25/2017, 3:50 PM Tresa Endo PT [phone]

## 2017-01-25 NOTE — Progress Notes (Signed)
Erika Hunter   DOB:02-01-1971   MW#:[id number]   YQI#:[id number]  Oncology follow-up note  Subjective: Erika Hunter is feeling better today, alert, with fluent speech, ate well today. She went to bathroom with her friend's assistance today, felt week and fell down on the floor, mild bruise and tenderness on left side, no apparent injury. She was seen by PT/OT and SNF placement was recommended. She was also seen by palliative care service today.    Objective:  Vitals:   01/25/17 1722 01/25/17 2008  BP: (!) 79/63 111/74  Pulse: 99 63  Resp: 18 16  Temp: 98.4 F (36.9 C) 99.2 F (37.3 C)    Body mass index is 19.62 kg/m.  Intake/Output Summary (Last 24 hours) at 01/25/17 2127 Last data filed at 01/25/17 2008  Gross per 24 hour  Intake              240 ml  Output                0 ml  Net              240 ml     Sclerae unicteric  Oropharynx clear  No peripheral adenopathy  Lungs clear -- no rales or rhonchi  Heart regular rate and rhythm  Abdomen benign  MSK no focal spinal tenderness, no peripheral edema    CBG (last 3)  No results for input(s): GLUCAP in the last 72 hours.   Labs:  Lab Results  Component Value Date   WBC 11.8 (H) 01/25/2017   HGB 14.2 01/25/2017   HCT 42.3 01/25/2017   MCV 93.2 01/25/2017   PLT 398 01/25/2017   NEUTROABS 8.2 (H) 01/24/2017    CMP Latest Ref Rng & Units 01/25/2017 01/24/2017 01/24/2017  Glucose 65 - 99 mg/dL 122(H) 110(H) 102(H)  BUN 6 - 20 mg/dL 28(H) 29(H) 19  Creatinine 0.44 - 1.00 mg/dL 0.52 0.53 0.52  Sodium 135 - 145 mmol/L 138 137 137  Potassium 3.5 - 5.1 mmol/L 4.0 3.9 4.2  Chloride 101 - 111 mmol/L 101 101 101  CO2 22 - 32 mmol/L '29 29 29  '$ Calcium 8.9 - 10.3 mg/dL 9.3 9.1 8.9  Total Protein 6.5 - 8.1 g/dL - - -  Total Bilirubin 0.3 - 1.2 mg/dL - - -  Alkaline Phos 38 - 126 U/L - - -  AST 15 - 41 U/L - - -  ALT 14 - 54 U/L - - -    Urine Studies No results for input(s): UHGB, CRYS in the last 72 hours.  Invalid input(s):  UACOL, UAPR, USPG, UPH, UTP, UGL, UKET, UBIL, UNIT, UROB, [place], UEPI, UWBC, Duwayne Heck [place], [place]  Basic Metabolic Panel:  Recent Labs Lab 01/18/17 2300  01/21/17 0513 01/24/17 0852 01/24/17 1048 01/24/17 1412 01/25/17 0419  NA  --   --  138 137  --  137 138  K  --   < > 4.1 4.2  --  3.9 4.0  CL  --   --  107 101  --  101 101  CO2  --   --  26 29  --  29 29  GLUCOSE  --   --  135* 102*  --  110* 122*  BUN  --   --  14 19  --  29* 28*  CREATININE 0.62  --  0.56 0.52  --  0.53 0.52  CALCIUM  --   --  9.1 8.9  --  9.1  9.3  MG  --   --   --   --  1.9  --  2.1  < > = values in this interval not displayed. GFR Estimated Creatinine Clearance: 73.4 mL/min (by C-G formula based on SCr of 0.52 mg/dL). Liver Function Tests: No results for input(s): AST, ALT, ALKPHOS, BILITOT, PROT, ALBUMIN in the last 168 hours. No results for input(s): LIPASE, AMYLASE in the last 168 hours. No results for input(s): AMMONIA in the last 168 hours. Coagulation profile  Recent Labs Lab 01/18/17 2300 01/21/17 0513  INR 1.00 1.04    CBC:  Recent Labs Lab 01/18/17 2300 01/21/17 0513 01/24/17 0852 01/24/17 1412 01/25/17 0419  WBC 12.8* 12.8* 10.9* 10.1 11.8*  NEUTROABS  --   --  8.2*  --   --   HGB 13.7 11.7* 13.9 14.2 14.2  HCT 42.1 36.2 41.4 42.2 42.3  MCV 96.8 98.9 94.5 93.2 93.2  PLT 475* 378 370 400 398   Cardiac Enzymes: No results for input(s): CKTOTAL, CKMB, CKMBINDEX, TROPONINI in the last 168 hours. BNP: Invalid input(s): POCBNP CBG:  Recent Labs Lab 01/19/17 0638  GLUCAP 194*   D-Dimer No results for input(s): DDIMER in the last 72 hours. Hgb A1c No results for input(s): HGBA1C in the last 72 hours. Lipid Profile No results for input(s): CHOL, HDL, LDLCALC, TRIG, CHOLHDL, LDLDIRECT in the last 72 hours. Thyroid function studies No results for input(s): TSH, T4TOTAL, T3FREE, THYROIDAB in the last 72 hours.  Invalid input(s): FREET3 Anemia work up No results  for input(s): VITAMINB12, FOLATE, FERRITIN, TIBC, IRON, RETICCTPCT in the last 72 hours. Microbiology Recent Results (from the past 240 hour(s))  Surgical pcr screen     Status: None   Collection Time: 01/20/17  1:43 PM  Result Value Ref Range Status   MRSA, PCR NEGATIVE NEGATIVE Final   Staphylococcus aureus NEGATIVE NEGATIVE Final    Comment:        The Xpert SA Assay (FDA approved for NASAL specimens in patients over 55 years of age), is one component of a comprehensive surveillance program.  Test performance has been validated by [facility] for patients greater than or equal to 14 year old. It is not intended to diagnose infection nor to guide or monitor treatment.       Studies:  No results found.  Assessment: 46 y.o. female with 20 PY smoking history, presented with headaches and difficulty on performingroutine jobs, fatigue and weight loss.   1. 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 Multiple brain mets (3) with vasogenic edema, and midline shift, on dexamethasone 3. History of anxiety and panic attack  4. Anorexia  5. Deconditioning   Recommendations:  -She will continue WBRT -she seems to be better today -continue dexa 6 mg every 6 hours -PT recommend SNF placement for rehab, I think that's the best given her limited social support, needs to go to a SNF who can offer transportation to our cancer center for RT -I encourage her to eat and drink more, and start PT/OT  -I will follow up   This document serves as a record of services personally performed by Truitt Merle, MD. It was created on her behalf by Maryla Morrow, a trained medical scribe. The creation of this record is based on the scribe's personal observations and the provider's statements to them. This document has been checked and approved by the attending provider.   Truitt Merle, MD  01/25/2017  9:27 PM

## 2017-01-25 NOTE — Consult Note (Signed)
Consultation Note Date: 01/25/2017   Patient Name: Erika Hunter  DOB: [birth year]  MRN: [id number]  Age / Sex: 46 y.o., female  PCP: Wardell Honour, MD Referring Physician: Debbe Odea, MD  Reason for Consultation: Disposition, Establishing goals of care and Psychosocial/spiritual support  HPI/Patient Profile: 46 y.o. female  with past medical history of EtOH abuse, COPD, anxiety, and HLD who presented to the ED with worsening headaches. Her daughter notes cognitive deficits concurrently with the onset of headaches. She was subsequently admitted on 01/18/2017 when brain imaging revealed three mass lesions, with associated vasogenic edema and 11m right-to-left shift. CT chest then showed a large LUL mass encasing the left main pulmonary artery,  invading the left pulmonary veins and left atrium, and extending into the distal left mainstem bronchus. She is s/p bronch and biopsy, which returned with small cell lung cancer. She has now started whole brain radiation with consideration for systemic chemotherapy after WBRT completed.   Clinical Assessment and Goals of Care: I met with JBlanch Mediaat her bedside. She was conversational but kept her eyes closed. She consistently answered my questions, but her daughter at the bedside clarified her answers as they were not accurate. JHenleighdoes seem a bit confused. She asked that I talk with her daughter outside of the room, and said she would defer to Brandi's decisions.  BVelna Hatchethad a very good understanding of her mother's health issues. She understands that her newly diagnosed lung cancer has spread to her brain, it is not curable, and it will result in her death. She also understands her mother's functional status is poor, and the treatment may cause greater burden then benefit. Her goal for her mother is focused around safety and comfort. That said, JDaniyahstrongly expressed the desire to seek out treatment and pursue  interventions intended to prolong her life. She wants radiation, systemic chemotherapy and, in the event of cardiac or respiratory failure, she wants CPR, defibrillation, and intubation. BVelna Hatchetwants to honor her wishes and intends to support her in pursuing these things.   In terms of discharge, BVelna Hatchetrelates that her mother does not have a safe home environment. She lives in a trailer with a friend, and the trailer is full of trash, bugs, and mold. This friend is also not able to provide adequate support for JJayel(in terms of transportation, support in ambulation, meal preparation, or support in ADLs). [place] long[place]term friend [place]'s [place]. She [facility]. As plans are made [place] we discussed the possibility of SNF/rehab in the interim to allow completion of radiation and support in improving functional status.Overall, the goal would be [facility] complete radiation [facility].   Primary Decision Maker The patient's mentation is variable. Her daughter is her HCPOA.   SUMMARY OF RECOMMENDATIONS    Full code and full scope interventions  Plan to complete radiation here, then [place]. [place]  If discharge expected prior the completion of XRT, pt would need SNF/Rehab that can transport her to treatments; SW consulted to assist with this  ?if Oncology could recommend facility or provider [place]  Chaplain consulted to support pt and daughter  **Clear goals of care and controlled symptoms at  present. I will follow-up with pt on Tuesday if she remains in the hospital. Please call the Palliative Team for any questions or concerns prior to then. [phone].  Code Status/Advance Care Planning:  Full code  Symptom  Management:   Presently denies any discomfort  Palliative Prophylaxis:   Aspiration, Bowel Regimen and Frequent Pain Assessment  Additional Recommendations (Limitations, Scope, Preferences):  Full Scope Treatment  Psycho-social/Spiritual:   Desire for further Chaplaincy support:yes  Additional Recommendations: Medicaid/Financial Assistance  Prognosis:   Unable to determine  Discharge Planning: [place] for rehab with Palliative care service follow-up      Primary Diagnoses: Present on Admission: . Small cell lung cancer, left ([place]) . Brain metastasis ([place]) . Tobacco user . Alcohol dependence ([place]) . Generalized anxiety disorder . Hyperlipidemia with target low density lipoprotein (LDL) cholesterol less than 100 mg/dL . Metastatic disease ([place]) . Headache . COPD (chronic obstructive pulmonary disease) ([place])   I have reviewed the medical record, interviewed the patient and family, and examined the patient. The following aspects are pertinent.  Past Medical History:  Diagnosis Date  . Alcohol dependence ([place]) 01/24/2017  . Anxiety    panic disorder  . Asthma   . Cervical dysplasia 11/19/1988   S/P conization  . COPD (chronic obstructive pulmonary disease) ([place]) 01/24/2017  . Depression   . Hyperlipidemia   . Steatohepatitis    h/o heavy alcohol use, hepatitis profile negative for A, B   Social History   Social History  . Marital status: Single    Spouse name: n/a  . Number of children: 2  . Years of education: High School   Occupational History  . Holiday representative   Social History Main Topics  . Smoking status: Current Every Day Smoker    Packs/day: 1.00    Years: 25.00  . Smokeless tobacco: Never Used  . Alcohol use Yes     Comment: wine - few glasses/day  . Drug use: No  . Sexual activity: Yes   Other Topics Concern  . None   Social History Narrative   Marital status: divorced; dating, moved to [place] from  [place] to be with her boyfriend..      Children; 2 children; no grandchildren      Lives: with a co-worker      Employment: clerk at gas station x 5.5 years      Tobacco; 3/4 ppd since 46 yo      Alcohol:  3 glasses wine per day.      Drugs: none      Exercise:  none   Family History  Problem Relation Age of Onset  . COPD Mother   . COPD Sister   . Cancer Sister 30    ovarian  . Diabetes Sister   . Mental illness Sister    Scheduled Meds: . dexamethasone  6 mg Oral Q6H  . enoxaparin (LOVENOX) injection  40 mg Subcutaneous Q24H  . feeding supplement (PRO-STAT SUGAR FREE 64)  30 mL Oral BID  . folic acid  1 mg Oral Daily  . levETIRAcetam  500 mg Oral BID  . mometasone-formoterol  2 puff Inhalation BID  . multivitamin with minerals  1 tablet Oral Daily  . nicotine  21 mg Transdermal Daily  . pantoprazole  40 mg Oral Q0600  . senna  1 tablet Oral BID  . sodium chloride flush  3 mL Intravenous Q12H  . thiamine  100 mg Oral Daily  .  tiotropium  18 mcg Inhalation Daily  . venlafaxine XR  225 mg Oral Q breakfast   Continuous Infusions: PRN Meds:.acetaminophen **OR** acetaminophen, alum & mag hydroxide-simeth, bisacodyl, fentaNYL (SUBLIMAZE) injection, guaiFENesin-dextromethorphan, ipratropium-albuterol, magnesium citrate, ondansetron **OR** ondansetron (ZOFRAN) IV, oxyCODONE, polyvinyl alcohol, sodium chloride flush, zolpidem No Known Allergies   Review of Systems  Constitutional: Positive for activity change, appetite change, fatigue and unexpected weight change.  HENT: Negative for congestion, hearing loss, sinus pressure, sore throat and trouble swallowing.   Eyes: Negative for visual disturbance.  Respiratory: Positive for cough. Negative for chest tightness and shortness of breath.   Cardiovascular: Negative for chest pain.  Gastrointestinal: Positive for constipation. Negative for abdominal distention, abdominal pain, nausea and vomiting.  Genitourinary: Positive for  urgency. Negative for difficulty urinating and dysuria.  Musculoskeletal: Positive for gait problem. Negative for back pain.  Skin: Positive for pallor.  Neurological: Positive for speech difficulty, weakness and headaches. Negative for dizziness and light-headedness.  Hematological: Bruises/bleeds easily.  Psychiatric/Behavioral: Positive for confusion and decreased concentration. Negative for agitation, self-injury and sleep disturbance. The patient is nervous/anxious.    Physical Exam  Constitutional: She has a sickly appearance.  Appears older than stated age, malnourished  HENT:  Head: Normocephalic and atraumatic.  Mouth/Throat: Oropharynx is clear and moist. No oropharyngeal exudate.  Eyes: Pupils are equal, round, and reactive to light.  Neck: Normal range of motion.  Cardiovascular: Regular rhythm.  Bradycardia present.   Pulmonary/Chest: Effort normal. No respiratory distress. She has decreased breath sounds in the right upper field. She has no wheezes.  Abdominal: Soft. Bowel sounds are normal. She exhibits no distension. There is no tenderness.  Musculoskeletal: Normal range of motion. She exhibits no edema.  Generalized weakness  Neurological: She is alert.  Answers questions consistently, but answers are incorrect (told me it is 2009 three different times, for example). Oriented to person consistently.   Skin: Skin is warm and dry. There is pallor.  Psychiatric: She has a normal mood and affect. Thought content normal. Her speech is delayed. She is slowed. Cognition and memory are impaired. She expresses impulsivity and inappropriate judgment.    Vital Signs: BP 125/72 (BP Location: Right Arm)   Pulse (!) 52   Temp 98.4 F (36.9 C) (Oral)   Resp 16   Ht _0  (1.651 m)   Wt 53.5 kg (117 lb 14.4 oz)   LMP 05/01/2012   SpO2 98%   BMI 19.62 kg/m  Pain Assessment: No/denies pain (very sleepy at this time and denied pain ) POSS *See Group Information*:  1-Acceptable,Awake and alert Pain Score: 0-No pain   SpO2: SpO2: 98 % O2 Device:SpO2: 98 % O2 Flow Rate: .O2 Flow Rate (L/min): 2 L/min  IO: Intake/output summary: No intake or output data in the 24 hours ending 01/25/17 1045  LBM: Last BM Date: 01/21/17 Baseline Weight: Weight: 49.9 kg (110 lb) Most recent weight: Weight: 53.5 kg (117 lb 14.4 oz)     Palliative Assessment/Data: PPS 50%    Time In: 1340 Time Out: 1450 Time Total: 105 minutes Greater than 50%  of this time was spent counseling and coordinating care related to the above assessment and plan.  Signed by: Charlynn Court, NP Palliative Medicine Team Pager # [phone] (M-F 7a-5p) Team Phone # [phone] (Nights/Weekends)

## 2017-01-25 NOTE — Progress Notes (Signed)
Bed alarm going off nurse entered room to find patient in floor with family members at the bedside. No apparent injury small red area noted to left side patient denies pain VS 98.2  BP 100/48 HR 51 100% RA. Patient assisted back to bed and Dr. Wynelle Cleveland notified. Daughter is at bedside and aware of situation.

## 2017-01-25 NOTE — Evaluation (Signed)
Occupational Therapy Evaluation Patient Details Name: Erika Hunter MRN: [id number] DOB: 12/10/1970 Today's Date: 01/25/2017    History of Present Illness 46 y.o. female  with past medical history of EtOH abuse, COPD, anxiety, and HLD who presented to the ED with worsening headaches. Her daughter noted cognitive deficits concurrently with the onset of headaches. She was subsequently admitted on 01/18/2017 when brain imaging revealed three mass lesions, with associated vasogenic edema . CT chest then showed a large LUL mass encasing the left main pulmonary artery,  invading the left pulmonary veins and left atrium, and extending into the distal left mainstem bronchus.    Clinical Impression   Pt was admitted for the above. Pt was independent with adls prior to admission. She will benefit from continued OT. Goals in acute are for min guard to min A. She currently needs min +2 for transfers and max A for LB adls.    Follow Up Recommendations  SNF    Equipment Recommendations  3 in 1 bedside commode    Recommendations for Other Services       Precautions / Restrictions Precautions Precautions: Fall Precaution Comments: has jerking motions at times Restrictions Weight Bearing Restrictions: No      Mobility Bed Mobility Overal bed mobility: Needs Assistance Bed Mobility: Supine to Sit     Supine to sit: Min assist;Mod assist;+2 for physical assistance Sit to supine: Min guard   General bed mobility comments: assist with legs and trunk to initiate.  Min guard getting back into bed  Transfers Overall transfer level: Needs assistance Equipment used: 1 person hand held assist Transfers: Sit to/from [facility] Sit to Stand: Min assist;+2 safety/equipment Stand pivot transfers: Min assist;+2 safety/equipment       General transfer comment: second person for safety    Balance Overall balance assessment: Needs assistance     Sitting balance - Comments: assist to  maintain trunk in sitting due to dizziness     Standing balance-Leahy Scale: Poor                              ADL Overall ADL's : Needs assistance/impaired                         Toilet Transfer: Minimal assistance;Stand-pivot;BSC;RW;+2 for safety/equipment   Toileting- Clothing Manipulation and Hygiene: Minimal assistance;Sit to/from stand;+2 for safety/equipment         General ADL Comments: Pt dizzy when sitting up.  BP 87/63.  Did transfer to 3:1 due to urgency.  Pt tired and needs a lot of assistance with adls because of this.  Based on clinical judgment, mod A for UB ADLs and max A for LB ADLS.  +2 assistance for safety for LB.     Vision   Additional Comments: pt kept eyes closed a lot--to be further assessed     Perception     Praxis      Pertinent Vitals/Pain Pain Assessment: No/denies pain     Hand Dominance     Extremity/Trunk Assessment Upper Extremity Assessment Upper Extremity Assessment: Generalized weakness           Communication Communication Communication: No difficulties (doesn't talk a lot)   Cognition Arousal/Alertness: Lethargic Behavior During Therapy: WFL for tasks assessed/performed Overall Cognitive Status: Impaired/Different from baseline Area of Impairment: Following commands;Problem solving;Memory;Safety/judgement     Memory: Decreased recall of precautions Following Commands: Follows one step  commands with increased time Safety/Judgement: Decreased awareness of safety   Problem Solving: Slow processing;Decreased initiation;Requires verbal cues;Requires tactile cues General Comments: pt oriented to [place]       Exercises       Shoulder Instructions      Home Living Family/patient expects to be discharged to:: Private residence Living Arrangements: Alone Available Help at Discharge: Family Type of Home: House                           Additional Comments:  unsure of prior level, daughter in from [place].      Prior Functioning/Environment Level of Independence: Independent        Comments: daughter here visiting from [place]        OT Problem List: Decreased strength;Decreased activity tolerance;Impaired balance (sitting and/or standing);Decreased cognition;Decreased safety awareness;Decreased knowledge of use of DME or AE      OT Treatment/Interventions: Self-care/ADL training;DME and/or AE instruction;Patient/family education;Therapeutic activities;Cognitive remediation/compensation;Balance training    OT Goals(Current goals can be found in the care plan section) Acute Rehab OT Goals Patient Stated Goal: none stated; agreeable to therapy OT Goal Formulation: With patient (in general terms) Time For Goal Achievement: 02/08/17 Potential to Achieve Goals: Good ADL Goals Pt Will Transfer to Toilet: with min guard assist;bedside commode;stand pivot transfer Additional ADL Goal #1: pt will sit eob or in chair and perform grooming and UB adls with set up and cue to initiate Additional ADL Goal #2: pt will perform LB adls with min guard, sit to stand Additional ADL Goal #3: pt will tolerate 20 minutes of activity with 2 rest breaks Additional ADL Goal #4: pt will identify call bell for assistance  OT Frequency: Min 2X/week   Barriers to D/C:            Co-evaluation              End of Session    Activity Tolerance: Patient limited by fatigue Patient left: in bed;with call bell/phone within reach;with bed alarm set;with family/visitor present  OT Visit Diagnosis: Unsteadiness on feet (R26.81);Muscle weakness (generalized) (M62.81)                ADL either performed or assessed with clinical judgement  Time: 1357-1413 OT Time Calculation (min): 16 min Charges:  OT General Charges $OT Visit: 1 Procedure OT Evaluation $OT Eval Moderate Complexity: 1 Procedure G-Codes:     [place],  OTR/L [phone] 01/25/2017  Erika Hunter 01/25/2017, 3:28 PM

## 2017-01-26 DIAGNOSIS — I951 Orthostatic hypotension: Secondary | ICD-10-CM

## 2017-01-26 MED ORDER — MIDODRINE HCL 5 MG PO TABS
5.0000 mg | ORAL_TABLET | Freq: Three times a day (TID) | ORAL | Status: DC
  Administered 2017-01-26 – 2017-01-30 (×12): 5 mg via ORAL
  Filled 2017-01-26 (×14): qty 1

## 2017-01-26 MED ORDER — ENSURE ENLIVE PO LIQD: 237.0000 mL | Freq: Two times a day (BID) | ORAL | Status: DC
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
## 2017-01-26 NOTE — Progress Notes (Signed)
PROGRESS NOTE    Erika Hunter   NIO:[id number]  DOB: 1971/11/14  DOA: 01/18/2017 PCP: Reginia Forts, MD   Brief Narrative:  Erika Hunter an 46 y.o.female with COPD, smoking, ETOH abusewho presented to [facility] with headaches, memory loss, confusion. No vision problems. 40-60 pounds unintentional weight loss over 4-6 months. Some night sweats. Slight wheezing, perhaps some cough for the last few years.     In the ER, CT head was worrisome for brain metastases and patient initially admitted under the neurosurgical team and placed on Decadron. Oncology and CT surgery were consulted. CT chest showed LUL mass. Patient underwent bronchoscopy which was positive for small cell lung cancer.. Patient was deemed no longer craniotomy candidate, transferred to hospitalist service and then transferred to [facility] for initiation of brain radiation and possible systemic chemotherapy.   Subjective: Needed a lot of encouragement from me to stand up for orthostatic vitals today. Noted to be significantly orthostatic per vital and symptoms. Felt dizzy when standing.   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 3/3 CT chest >> LUL lung mass 01/21/17 - biopsy by Dr Servando Snare >> small cell lung CA - evaluated by Dr Burr Medico - 3/8 transferred to [facility] long for radiaition - cont Decadron, obtained palliative care eval in patient who appears depressed,taking meds intermittently in the hospital and is not eating- daughter is POA- patient wants to try for aggressive treatment- encouraged to eat well  Anorexia - cont slow IVF to prevent dehydration while watching PO intake  Dizziness, weakness on standing Orthostatic hypotension/ autonomic instability  - noted to have dizziness and falls after short walk to commode - orthostatics  checked on 3/9 - positive orthostatic vitals- no improvement despite aggressive hydration - etiology? H/o ETOH abuse. On Effexor which is listed as a cause but I am doubtful this her cause - start Midodrine and cont to follow orthostatics BID    COPD (chronic obstructive pulmonary disease) /   Tobacco user - has Albuterol inhaler listed on home meds - on Dulera, Nicotine patch and PRN nebs in hospital-   Anxiety/ depression?  - per home med list, takes, Xanax and high dose Effexor ('225mg'$ ) at home     Alcohol dependence   - per history - no cirrhosis noted on CT abd/pelvis on 3/3    DVT prophylaxis: Lovenox Code Status: Full code Family Communication: daughter Disposition Plan: SNF-  Consultants:   Initially admitted under neurosurgery Dr. Cyndy Freeze  Radiation oncology, Worthy Flank, PA 01/22/2017  Oncology: Dr. Burr Medico 01/20/2017  CVTS: Dr Gerhardt/Dr. Lucianne Lei tright 01/20/2017  [facility]: Dr. Lorin Mercy 01/23/2017 Procedures:   Bronchoscopy with brushings and biopsy of left upper lobe mass per Dr.Gerhardt 01/21/2017 Antimicrobials:  Anti-infectives    None       Objective: Vitals:   01/25/17 2010 01/26/17 0516 01/26/17 0929 01/26/17 0950  BP:  127/65    Pulse:  71    Resp:  14 20   Temp:  98 F (36.7 C)    TempSrc:  Oral    SpO2: 98% 99% 98% 98%  Weight:      Height:        Intake/Output Summary (Last 24 hours) at 01/26/17 1058 Last data filed at 01/26/17 0800  Gross per 24 hour  Intake  3416.24 ml  Output              150 ml  Net          3266.24 ml   Filed Weights   01/18/17 1737 01/19/17 0357  Weight: 49.9 kg (110 lb) 53.5 kg (117 lb 14.4 oz)    Examination: General exam: Appears comfortable  HEENT: PERRLA, oral mucosa moist, no sclera icterus or thrush Respiratory system: Clear to auscultation. Respiratory effort normal. Cardiovascular system: S1 & S2 heard, RRR.  No murmurs  Gastrointestinal system: Abdomen soft, non-tender,  nondistended. Normal bowel sound. No organomegaly Central nervous system: Alert and oriented. Intermittently confused. No focal neurological deficits. Extremities: No cyanosis, clubbing or edema Skin: No rashes or ulcers Psychiatry:  Depressed, intermittently confused    Data Reviewed: I have personally reviewed following labs and imaging studies  CBC:  Recent Labs Lab 01/21/17 0513 01/24/17 0852 01/24/17 1412 01/25/17 0419  WBC 12.8* 10.9* 10.1 11.8*  NEUTROABS  --  8.2*  --   --   HGB 11.7* 13.9 14.2 14.2  HCT 36.2 41.4 42.2 42.3  MCV 98.9 94.5 93.2 93.2  PLT 378 370 400 846   Basic Metabolic Panel:  Recent Labs Lab 01/21/17 0513 01/24/17 0852 01/24/17 1048 01/24/17 1412 01/25/17 0419  NA 138 137  --  137 138  K 4.1 4.2  --  3.9 4.0  CL 107 101  --  101 101  CO2 26 29  --  29 29  GLUCOSE 135* 102*  --  110* 122*  BUN 14 19  --  29* 28*  CREATININE 0.56 0.52  --  0.53 0.52  CALCIUM 9.1 8.9  --  9.1 9.3  MG  --   --  1.9  --  2.1   GFR: Estimated Creatinine Clearance: 73.4 mL/min (by C-G formula based on SCr of 0.52 mg/dL). Liver Function Tests: No results for input(s): AST, ALT, ALKPHOS, BILITOT, PROT, ALBUMIN in the last 168 hours. No results for input(s): LIPASE, AMYLASE in the last 168 hours. No results for input(s): AMMONIA in the last 168 hours. Coagulation Profile:  Recent Labs Lab 01/21/17 0513  INR 1.04   Cardiac Enzymes: No results for input(s): CKTOTAL, CKMB, CKMBINDEX, TROPONINI in the last 168 hours. BNP (last 3 results) No results for input(s): PROBNP in the last 8760 hours. HbA1C: No results for input(s): HGBA1C in the last 72 hours. CBG: No results for input(s): GLUCAP in the last 168 hours. Lipid Profile: No results for input(s): CHOL, HDL, LDLCALC, TRIG, CHOLHDL, LDLDIRECT in the last 72 hours. Thyroid Function Tests: No results for input(s): TSH, T4TOTAL, FREET4, T3FREE, THYROIDAB in the last 72 hours. Anemia Panel: No results  for input(s): VITAMINB12, FOLATE, FERRITIN, TIBC, IRON, RETICCTPCT in the last 72 hours. Urine analysis:    Component Value Date/Time   COLORURINE YELLOW 01/20/2017 1558   APPEARANCEUR CLOUDY (A) 01/20/2017 1558   LABSPEC 1.019 01/20/2017 1558   PHURINE 7.0 01/20/2017 1558   GLUCOSEU NEGATIVE 01/20/2017 1558   HGBUR NEGATIVE 01/20/2017 1558   BILIRUBINUR NEGATIVE 01/20/2017 1558   BILIRUBINUR neg 08/24/2014 1703   KETONESUR NEGATIVE 01/20/2017 1558   PROTEINUR NEGATIVE 01/20/2017 1558   UROBILINOGEN 0.2 08/24/2014 1703   NITRITE NEGATIVE 01/20/2017 1558   LEUKOCYTESUR NEGATIVE 01/20/2017 1558   Sepsis Labs: '@LABRCNTIP'$ (procalcitonin:4,lacticidven:4) ) Recent Results (from the past 240 hour(s))  Surgical pcr screen     Status: None   Collection Time: 01/20/17  1:43 PM  Result Value Ref  Range Status   MRSA, PCR NEGATIVE NEGATIVE Final   Staphylococcus aureus NEGATIVE NEGATIVE Final    Comment:        The Xpert SA Assay (FDA approved for NASAL specimens in patients over 4 years of age), is one component of a comprehensive surveillance program.  Test performance has been validated by [facility] for patients greater than or equal to 60 year old. It is not intended to diagnose infection nor to guide or monitor treatment.          Radiology Studies: No results found.    Scheduled Meds: . dexamethasone  6 mg Oral Q6H  . enoxaparin (LOVENOX) injection  40 mg Subcutaneous Q24H  . feeding supplement (PRO-STAT SUGAR FREE 64)  30 mL Oral BID  . folic acid  1 mg Oral Daily  . levETIRAcetam  500 mg Oral BID  . midodrine  5 mg Oral TID WC  . mometasone-formoterol  2 puff Inhalation BID  . multivitamin with minerals  1 tablet Oral Daily  . nicotine  21 mg Transdermal Daily  . pantoprazole  40 mg Oral Q0600  . senna  1 tablet Oral BID  . sodium chloride flush  3 mL Intravenous Q12H  . thiamine  100 mg Oral Daily  . tiotropium  18 mcg Inhalation Daily  . venlafaxine XR   225 mg Oral Q breakfast   Continuous Infusions: . sodium chloride 125 mL/hr at 01/26/17 0147     LOS: 8 days    Time spent in minutes: 25    [place], MD [facility] Pager: [email or web address] Password [email or web address] 01/26/2017, 10:58 AM

## 2017-01-27 LAB — BASIC METABOLIC PANEL
ANION GAP: 6 (ref 5–15)
BUN: 18 mg/dL (ref 6–20)
CALCIUM: 8.7 mg/dL — AB (ref 8.9–10.3)
CO2: 27 mmol/L (ref 22–32)
Chloride: 103 mmol/L (ref 101–111)
Creatinine, Ser: 0.52 mg/dL (ref 0.44–1.00)
GFR calc non Af Amer: >60 mL/min (ref >60)
GLUCOSE: 141 mg/dL — AB (ref 65–99)
Potassium: 4 mmol/L (ref 3.5–5.1)
Sodium: 136 mmol/L (ref 135–145)

## 2017-01-27 MED ORDER — FLUDROCORTISONE ACETATE 0.1 MG PO TABS
0.1000 mg | ORAL_TABLET | Freq: Every day | ORAL | Status: DC
  Administered 2017-01-27 – 2017-01-28 (×2): 0.1 mg via ORAL
  Filled 2017-01-27 (×2): qty 1

## 2017-01-27 MED ORDER — BISACODYL 10 MG RE SUPP
10.0000 mg | Freq: Once | RECTAL | Status: DC
  Filled 2017-01-27: qty 1

## 2017-01-27 NOTE — Progress Notes (Signed)
PROGRESS NOTE    Erika Hunter   VVO:[id number]  DOB: 06/12/1971  DOA: 01/18/2017 PCP: Reginia Forts, MD   Brief Narrative:  [name] an 46 y.o.female with COPD, smoking, ETOH abusewho presented to [facility] with headaches, memory loss, confusion. No vision problems. 40-60 pounds unintentional weight loss over 4-6 months. Some night sweats. Slight wheezing, perhaps some cough for the last few years.     In the ER, CT head was worrisome for brain metastases and patient initially admitted under the neurosurgical team and placed on Decadron. Oncology and CT surgery were consulted. CT chest showed LUL mass. Patient underwent bronchoscopy which was positive for small cell lung cancer.. Patient was deemed no longer craniotomy candidate, transferred to hospitalist service and then transferred to [facility] for initiation of brain radiation and possible systemic chemotherapy.   Subjective: Eating and drinking more now. Actually feeling hungry over the past couple of days.   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 3/3 CT chest >> LUL lung mass 01/21/17 - biopsy by Dr Servando Snare >> small cell lung CA - evaluated by Dr Burr Medico - 3/8 transferred to [facility] long for radiaition - cont Decadron, obtained palliative care eval in patient who appears depressed, taking meds intermittently in the hospital and is not eating- daughter is POA- patient wants to try for aggressive treatment- encouraged to eat well -now doing better with oral intake and medication compliance  Anorexia - cont slow IVF to prevent dehydration while watching PO intake- intake is improving steadily   Dizziness, weakness on standing Orthostatic hypotension/ autonomic instability  - noted to have dizziness and falls after short walk to commode  -  orthostatics checked on 3/9- positive orthostatic vitals- no improvement despite aggressive hydration - etiology? H/o ETOH abuse. On Effexor which is listed as a cause but I am doubtful this her cause - started Midodrine 5 mg TID - HR now in 50s due to Midodrine and she remains orthostatic- due to bradycardia, will not increase Midodrine  - start Fludrocortisone 0.1 mg daily today - also on Decadron which will help with fluid retention - discussed with patient and daughter that we may not be able to resolve this and it will worsen if she gets dehydrated- they understand    COPD (chronic obstructive pulmonary disease) /   Tobacco user - has Albuterol inhaler listed on home meds - on Dulera, Nicotine patch and PRN nebs in hospital-   Anxiety/ depression?  - per home med list, takes, Xanax and high dose Effexor ('225mg'$ ) at home     Alcohol dependence   - per history - daughter states (without patient knowing) that she has been drinking about 40 years and has more than 1 bottle of wine daily  - the patient states she only has a couple of glasses of wine daily and has not been drinking for longer than 10 yrs    DVT prophylaxis: Lovenox Code Status: Full code Family Communication: daughter who is POA Disposition Plan: SNF-  Consultants:   Initially admitted under neurosurgery Dr. Cyndy Freeze  Radiation oncology, Worthy Flank, PA 01/22/2017  Oncology: Dr. Burr Medico 01/20/2017  CVTS: Dr Gerhardt/Dr. Lucianne Lei tright 01/20/2017  [facility]: Dr. Lorin Mercy 01/23/2017 Procedures:   Bronchoscopy with brushings and biopsy of left upper lobe mass per Dr.Gerhardt 01/21/2017 Antimicrobials:  Anti-infectives  None       Objective: Vitals:   01/26/17 1324 01/26/17 2116 01/27/17 0521 01/27/17 0830  BP: (!) 149/70 135/83 134/72   Pulse: (!) 51 (!) 55 (!) 54   Resp: '16 14 18   '$ Temp: 98.5 F (36.9 C) 99.4 F (37.4 C) 98.5 F (36.9 C)   TempSrc: Oral Oral Tympanic   SpO2: 99% 99% 96% 98%    Weight:      Height:        Intake/Output Summary (Last 24 hours) at 01/27/17 1348 Last data filed at 01/27/17 1200  Gross per 24 hour  Intake             3500 ml  Output             1000 ml  Net             2500 ml   Filed Weights   01/18/17 1737 01/19/17 0357  Weight: 49.9 kg (110 lb) 53.5 kg (117 lb 14.4 oz)    Examination: General exam: Appears comfortable  HEENT: PERRLA, oral mucosa moist, no sclera icterus or thrush Respiratory system: Clear to auscultation. Respiratory effort normal. Cardiovascular system: S1 & S2 heard, RRR.  No murmurs  Gastrointestinal system: Abdomen soft, non-tender, nondistended. Normal bowel sound. No organomegaly Central nervous system: Alert and oriented - No focal neurological deficits. Extremities: No cyanosis, clubbing or edema Skin: No rashes or ulcers Psychiatry:  More talkative today, no eye contact as before    Data Reviewed: I have personally reviewed following labs and imaging studies  CBC:  Recent Labs Lab 01/21/17 0513 01/24/17 0852 01/24/17 1412 01/25/17 0419  WBC 12.8* 10.9* 10.1 11.8*  NEUTROABS  --  8.2*  --   --   HGB 11.7* 13.9 14.2 14.2  HCT 36.2 41.4 42.2 42.3  MCV 98.9 94.5 93.2 93.2  PLT 378 370 400 268   Basic Metabolic Panel:  Recent Labs Lab 01/21/17 0513 01/24/17 0852 01/24/17 1048 01/24/17 1412 01/25/17 0419 01/27/17 1110  NA 138 137  --  137 138 136  K 4.1 4.2  --  3.9 4.0 4.0  CL 107 101  --  101 101 103  CO2 26 29  --  '29 29 27  '$ GLUCOSE 135* 102*  --  110* 122* 141*  BUN 14 19  --  29* 28* 18  CREATININE 0.56 0.52  --  0.53 0.52 0.52  CALCIUM 9.1 8.9  --  9.1 9.3 8.7*  MG  --   --  1.9  --  2.1  --    GFR: Estimated Creatinine Clearance: 73.4 mL/min (by C-G formula based on SCr of 0.52 mg/dL). Liver Function Tests: No results for input(s): AST, ALT, ALKPHOS, BILITOT, PROT, ALBUMIN in the last 168 hours. No results for input(s): LIPASE, AMYLASE in the last 168 hours. No results for  input(s): AMMONIA in the last 168 hours. Coagulation Profile:  Recent Labs Lab 01/21/17 0513  INR 1.04   Cardiac Enzymes: No results for input(s): CKTOTAL, CKMB, CKMBINDEX, TROPONINI in the last 168 hours. BNP (last 3 results) No results for input(s): PROBNP in the last 8760 hours. HbA1C: No results for input(s): HGBA1C in the last 72 hours. CBG: No results for input(s): GLUCAP in the last 168 hours. Lipid Profile: No results for input(s): CHOL, HDL, LDLCALC, TRIG, CHOLHDL, LDLDIRECT in the last 72 hours. Thyroid Function Tests: No results for input(s): TSH, T4TOTAL, FREET4, T3FREE, THYROIDAB in the last 72 hours. Anemia Panel: No  results for input(s): VITAMINB12, FOLATE, FERRITIN, TIBC, IRON, RETICCTPCT in the last 72 hours. Urine analysis:    Component Value Date/Time   COLORURINE YELLOW 01/20/2017 1558   APPEARANCEUR CLOUDY (A) 01/20/2017 1558   LABSPEC 1.019 01/20/2017 1558   PHURINE 7.0 01/20/2017 1558   GLUCOSEU NEGATIVE 01/20/2017 1558   HGBUR NEGATIVE 01/20/2017 1558   BILIRUBINUR NEGATIVE 01/20/2017 1558   BILIRUBINUR neg 08/24/2014 1703   KETONESUR NEGATIVE 01/20/2017 1558   PROTEINUR NEGATIVE 01/20/2017 1558   UROBILINOGEN 0.2 08/24/2014 1703   NITRITE NEGATIVE 01/20/2017 1558   LEUKOCYTESUR NEGATIVE 01/20/2017 1558   Sepsis Labs: '@LABRCNTIP'$ (procalcitonin:4,lacticidven:4) ) Recent Results (from the past 240 hour(s))  Surgical pcr screen     Status: None   Collection Time: 01/20/17  1:43 PM  Result Value Ref Range Status   MRSA, PCR NEGATIVE NEGATIVE Final   Staphylococcus aureus NEGATIVE NEGATIVE Final    Comment:        The Xpert SA Assay (FDA approved for NASAL specimens in patients over 79 years of age), is one component of a comprehensive surveillance program.  Test performance has been validated by [facility] for patients greater than or equal to 29 year old. It is not intended to diagnose infection nor to guide or monitor treatment.           Radiology Studies: No results found.    Scheduled Meds: . bisacodyl  10 mg Rectal Once  . dexamethasone  6 mg Oral Q6H  . enoxaparin (LOVENOX) injection  40 mg Subcutaneous Q24H  . feeding supplement (ENSURE ENLIVE)  237 mL Oral BID BM  . feeding supplement (PRO-STAT SUGAR FREE 64)  30 mL Oral BID  . fludrocortisone  0.1 mg Oral Daily  . folic acid  1 mg Oral Daily  . levETIRAcetam  500 mg Oral BID  . midodrine  5 mg Oral TID WC  . mometasone-formoterol  2 puff Inhalation BID  . multivitamin with minerals  1 tablet Oral Daily  . nicotine  21 mg Transdermal Daily  . pantoprazole  40 mg Oral Q0600  . senna  1 tablet Oral BID  . sodium chloride flush  3 mL Intravenous Q12H  . thiamine  100 mg Oral Daily  . tiotropium  18 mcg Inhalation Daily  . venlafaxine XR  225 mg Oral Q breakfast   Continuous Infusions: . sodium chloride 125 mL/hr at 01/27/17 1200     LOS: 9 days    Time spent in minutes: 15    Stuckey, MD [facility] Pager: [email or web address] Password [email or web address] 01/27/2017, 1:48 PM

## 2017-01-28 ENCOUNTER — Ambulatory Visit
Admit: 2017-01-28 | Discharge: 2017-01-28 | Disposition: A | Discharge status: Home / Self Care | Payer: Medicaid Other | Attending: Radiation Oncology | Admitting: Radiation Oncology

## 2017-01-28 MED ORDER — KETOROLAC TROMETHAMINE 30 MG/ML IJ SOLN
30.0000 mg | Freq: Once | INTRAMUSCULAR | Status: AC
  Administered 2017-01-28: 30 mg via INTRAVENOUS
  Filled 2017-01-28: qty 1

## 2017-01-28 MED ORDER — METOCLOPRAMIDE HCL 5 MG/ML IJ SOLN
10.0000 mg | Freq: Once | INTRAMUSCULAR | Status: AC
  Administered 2017-01-28: 10 mg via INTRAVENOUS
  Filled 2017-01-28: qty 2

## 2017-01-28 MED ORDER — IBUPROFEN 200 MG PO TABS
400.0000 mg | ORAL_TABLET | ORAL | Status: DC | PRN
  Administered 2017-01-31: 400 mg via ORAL
  Filled 2017-01-28 (×3): qty 2

## 2017-01-28 MED ORDER — DIPHENHYDRAMINE HCL 50 MG/ML IJ SOLN
25.0000 mg | Freq: Once | INTRAMUSCULAR | Status: AC
  Administered 2017-01-28: 25 mg via INTRAVENOUS
  Filled 2017-01-28: qty 1

## 2017-01-28 MED ORDER — FLUDROCORTISONE ACETATE 0.1 MG PO TABS
0.1000 mg | ORAL_TABLET | Freq: Two times a day (BID) | ORAL | Status: DC
  Administered 2017-01-28 – 2017-01-29 (×2): 0.1 mg via ORAL
  Filled 2017-01-28 (×2): qty 1

## 2017-01-28 MED ORDER — OXYCODONE HCL 5 MG PO TABS
5.0000 mg | ORAL_TABLET | Freq: Once | ORAL | Status: AC
  Administered 2017-01-28: 5 mg via ORAL
  Filled 2017-01-28: qty 1

## 2017-01-28 NOTE — Progress Notes (Signed)
PROGRESS NOTE    Erika Hunter   RKY:[id number]  DOB: 1971/08/10  DOA: 01/18/2017 PCP: Reginia Forts, MD   Brief Narrative:  Erika Hunter an 46 y.o.female with COPD, smoking, ETOH abusewho presented to [facility] with headaches, memory loss, confusion. No vision problems. 40-60 pounds unintentional weight loss over 4-6 months. Some night sweats. Slight wheezing, perhaps some cough for the last few years.     In the ER, CT head was worrisome for brain metastases and patient initially admitted under the neurosurgical team and placed on Decadron. Oncology and CT surgery were consulted. CT chest showed LUL mass. Patient underwent bronchoscopy which was positive for small cell lung cancer.. Patient was deemed no longer craniotomy candidate, transferred to hospitalist service and then transferred to [facility] for initiation of brain radiation and possible systemic chemotherapy.   Subjective: Eating and drinking well. Was able to drink about 1 L of fluid yesterday. Fell last night while trying to get out of bed to commode alone. Have advised her to call for help, when getting out of bed, sit up for at least 30 sec with her legs hanging down prior to standing. If she does not feel well enough to stand, she needs to lay back down and ask for a bedpan. Discusses same with her nurse and daughter.   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 3/3 CT chest >> LUL lung mass 01/21/17 - biopsy by Dr Servando Snare >> small cell lung CA - evaluated by Dr Burr Medico - 3/8 transferred to [facility] long for radiaition - cont Decadron, obtained palliative care eval in patient who appears depressed, taking meds intermittently in the hospital and is not eating- daughter is POA- patient wants to try for aggressive treatment- encouraged to eat well  -now doing better with oral intake and medication compliance  Anorexia - cont slow IVF to prevent dehydration while watching PO intake- intake is improving steadily   Dizziness, weakness on standing Orthostatic hypotension/ autonomic instability  - noted to have dizziness and falls after short walk to commode  - orthostatics checked on 3/9- positive orthostatic vitals- no improvement despite aggressive hydration - etiology? H/o ETOH abuse. On Effexor which is listed as a cause but I am doubtful this her cause - started Midodrine 5 mg TID - HR now in 50s due to Midodrine and she remains orthostatic- due to bradycardia, will not increase Midodrine  -increase Fludrocortisone 0.1 mg BID as she is still orthostatic today - also on Decadron which will help with fluid retention - discussed with patient and daughter that we may not be able to resolve this and it will worsen if she gets dehydrated- they understand    COPD (chronic obstructive pulmonary disease) /   Tobacco user - has Albuterol inhaler listed on home meds - on Dulera, Nicotine patch and PRN nebs in hospital-   Anxiety/ depression?  - per home med list, takes, Xanax and high dose Effexor ('225mg'$ ) at home     Alcohol dependence   - per history - daughter states (without patient knowing) that she has been drinking about 40 years and has more than 1 bottle of wine daily  - the patient states she only has a couple of glasses of wine daily and has not been drinking for longer than 10 yrs  DVT prophylaxis: Lovenox Code Status: Full code Family Communication: daughter who is POA Disposition Plan: SNF-  Consultants:   Initially admitted under neurosurgery Dr. Cyndy Freeze  Radiation oncology, Worthy Flank, PA 01/22/2017  Oncology: Dr. Burr Medico 01/20/2017  CVTS: Dr Gerhardt/Dr. Lucianne Lei tright 01/20/2017  [facility] hospitalists: Dr. Lorin Mercy 01/23/2017 Procedures:   Bronchoscopy with brushings and biopsy of left upper lobe mass per Dr.Gerhardt  01/21/2017 Antimicrobials:  Anti-infectives    None       Objective: Vitals:   01/27/17 0830 01/27/17 2035 01/27/17 2355 01/28/17 0458  BP:  124/76 (!) 155/92 (!) 142/78  Pulse:  (!) 58 (!) 53 (!) 54  Resp:  '20 18 17  '$ Temp:  99.3 F (37.4 C) 98.9 F (37.2 C) 97.6 F (36.4 C)  TempSrc:  Oral Oral Oral  SpO2: 98% 97% 96% 98%  Weight:      Height:        Intake/Output Summary (Last 24 hours) at 01/28/17 1456 Last data filed at 01/28/17 0500  Gross per 24 hour  Intake             2125 ml  Output                0 ml  Net             2125 ml   Filed Weights   01/18/17 1737 01/19/17 0357  Weight: 49.9 kg (110 lb) 53.5 kg (117 lb 14.4 oz)    Examination: General exam: Appears comfortable  HEENT: PERRLA, oral mucosa moist, no sclera icterus or thrush Respiratory system: Clear to auscultation. Respiratory effort normal. Cardiovascular system: S1 & S2 heard, RRR.  No murmurs  Gastrointestinal system: Abdomen soft, non-tender, nondistended. Normal bowel sound. No organomegaly Central nervous system: Alert and oriented - No focal neurological deficits. Extremities: No cyanosis, clubbing or edema Skin: No rashes or ulcers Psychiatry:  More talkative today, no eye contact as before    Data Reviewed: I have personally reviewed following labs and imaging studies  CBC:  Recent Labs Lab 01/24/17 0852 01/24/17 1412 01/25/17 0419  WBC 10.9* 10.1 11.8*  NEUTROABS 8.2*  --   --   HGB 13.9 14.2 14.2  HCT 41.4 42.2 42.3  MCV 94.5 93.2 93.2  PLT 370 400 469   Basic Metabolic Panel:  Recent Labs Lab 01/24/17 0852 01/24/17 1048 01/24/17 1412 01/25/17 0419 01/27/17 1110  NA 137  --  137 138 136  K 4.2  --  3.9 4.0 4.0  CL 101  --  101 101 103  CO2 29  --  '29 29 27  '$ GLUCOSE 102*  --  110* 122* 141*  BUN 19  --  29* 28* 18  CREATININE 0.52  --  0.53 0.52 0.52  CALCIUM 8.9  --  9.1 9.3 8.7*  MG  --  1.9  --  2.1  --    GFR: Estimated Creatinine Clearance: 73.4  mL/min (by C-G formula based on SCr of 0.52 mg/dL). Liver Function Tests: No results for input(s): AST, ALT, ALKPHOS, BILITOT, PROT, ALBUMIN in the last 168 hours. No results for input(s): LIPASE, AMYLASE in the last 168 hours. No results for input(s): AMMONIA in the last 168 hours. Coagulation Profile: No results for input(s): INR, PROTIME in the last 168 hours. Cardiac Enzymes: No results for input(s): CKTOTAL, CKMB, CKMBINDEX, TROPONINI in the last 168 hours. BNP (last 3 results) No results for input(s): PROBNP in the last 8760 hours. HbA1C: No results for input(s): HGBA1C  in the last 72 hours. CBG: No results for input(s): GLUCAP in the last 168 hours. Lipid Profile: No results for input(s): CHOL, HDL, LDLCALC, TRIG, CHOLHDL, LDLDIRECT in the last 72 hours. Thyroid Function Tests: No results for input(s): TSH, T4TOTAL, FREET4, T3FREE, THYROIDAB in the last 72 hours. Anemia Panel: No results for input(s): VITAMINB12, FOLATE, FERRITIN, TIBC, IRON, RETICCTPCT in the last 72 hours. Urine analysis:    Component Value Date/Time   COLORURINE YELLOW 01/20/2017 1558   APPEARANCEUR CLOUDY (A) 01/20/2017 1558   LABSPEC 1.019 01/20/2017 1558   PHURINE 7.0 01/20/2017 1558   GLUCOSEU NEGATIVE 01/20/2017 1558   HGBUR NEGATIVE 01/20/2017 1558   BILIRUBINUR NEGATIVE 01/20/2017 1558   BILIRUBINUR neg 08/24/2014 1703   KETONESUR NEGATIVE 01/20/2017 1558   PROTEINUR NEGATIVE 01/20/2017 1558   UROBILINOGEN 0.2 08/24/2014 1703   NITRITE NEGATIVE 01/20/2017 1558   LEUKOCYTESUR NEGATIVE 01/20/2017 1558   Sepsis Labs: '@LABRCNTIP'$ (procalcitonin:4,lacticidven:4) ) Recent Results (from the past 240 hour(s))  Surgical pcr screen     Status: None   Collection Time: 01/20/17  1:43 PM  Result Value Ref Range Status   MRSA, PCR NEGATIVE NEGATIVE Final   Staphylococcus aureus NEGATIVE NEGATIVE Final    Comment:        The Xpert SA Assay (FDA approved for NASAL specimens in patients over 21 years  of age), is one component of a comprehensive surveillance program.  Test performance has been validated by [facility] for patients greater than or equal to 4 year old. It is not intended to diagnose infection nor to guide or monitor treatment.          Radiology Studies: No results found.    Scheduled Meds: . bisacodyl  10 mg Rectal Once  . dexamethasone  6 mg Oral Q6H  . enoxaparin (LOVENOX) injection  40 mg Subcutaneous Q24H  . feeding supplement (ENSURE ENLIVE)  237 mL Oral BID BM  . feeding supplement (PRO-STAT SUGAR FREE 64)  30 mL Oral BID  . fludrocortisone  0.1 mg Oral BID  . folic acid  1 mg Oral Daily  . levETIRAcetam  500 mg Oral BID  . midodrine  5 mg Oral TID WC  . mometasone-formoterol  2 puff Inhalation BID  . multivitamin with minerals  1 tablet Oral Daily  . nicotine  21 mg Transdermal Daily  . pantoprazole  40 mg Oral Q0600  . senna  1 tablet Oral BID  . sodium chloride flush  3 mL Intravenous Q12H  . thiamine  100 mg Oral Daily  . tiotropium  18 mcg Inhalation Daily  . venlafaxine XR  225 mg Oral Q breakfast   Continuous Infusions:    LOS: 10 days    Time spent in minutes: 38    Saunders, MD [facility] Pager: [email or web address] Password [facility] 01/28/2017, 2:56 PM

## 2017-01-28 NOTE — Clinical Social Work Note (Signed)
Clinical Social Work Assessment  Patient Details  Name: Erika Hunter MRN: [id number] Date of Birth: [birth year]  Date of referral:  01/28/17               Reason for consult:  Facility Placement, [employer], Discharge Planning                Permission sought to share information with:  Tourist information centre manager, Customer service manager, Family Supports Permission granted to share information::  Yes, Verbal Permission Granted  Name::        Agency::     Relationship::  daughter Erika Hunter Information:     Housing/Transportation Living arrangements for the past 2 months:  Apartment Radio producer) Source of Information:  Scientist, water quality, Palliative Care Team, Adult Children, Case Manager Patient Interpreter Needed:  None Criminal Activity/Legal Involvement Pertinent to Current Situation/Hospitalization:  No - Comment as needed Significant Relationships:  Adult Children, Other Family Members, Friend Lives with:  Roommate Do you feel safe going back to the place where you live?  No Need for family participation in patient care:  Yes (Comment)  Care giving concerns:  LCSW met with daughter at bedside to discuss plans and care for patient. Prior to admission, patient was living in a trailer with a friend.  Erika Hunter is not a suitable environment for patient to return, very dirty, bugs, mold and patient is a heavy drinker.  Daughter reports she has not see patient in over six years. All family is in [place] and daughter lives in [place].  Reports patient moved down to [place] to reunite with an old boyfriend, plans did not work out and she was kicked out, and living with a friend/roommate.  Patient was working around 20 hours a week at a [employer] called daughter because of erratic and bizarre behaviors of patient.  Daughter thought she was coming down to put patient into a alcohol rehab facility, but instead learned she had brain cancer.  Currently patient has no where to go.  She  cannot return to living situation at this time. Daughter is only here until March 20th due to work and getting married.  Daughter reports ultimate plan is for patient to complete her Radiation on 3/21 and then will be moving to [place] to live with her best friend who will assume responsibility and care of patient.  Patient unable to care for self without 24 supervision as she has had 2 falls in the hospital, unable to walk and is disorganized in thoughts. Daughter is the POA and helping with patient's care and decisions, however lacks transportation and housing (currently staying in room with patient).  Other family has arrived and staying in hotels per daughter report, but not here long term.   Social Worker assessment / plan:  LCSW explained role, consult and assistance with disposition. When patient was at [facility], an application was started and financial counselors verified they will mail it off and DSS will follow up with daughter.    LCSW explained SNF process with daughter who was frustrated as she thought patient would remain in hospital to complete radiation and then move to [place] to begin chemotherapy and stay with friend. Friend is already applying for disability and insurance in [place] and working to [employer] care to [place].  LCSW will staff care with directors for assistance with payor source and placement options. Daughter is aware of possible out of county placement if needing, but will focus on in county to exhaust  all options.   SNF work up has been completed.   Employment status:  Disabled (Comment on whether or not currently receiving Disability) Insurance information:  Self Pay (Medicaid Pending) PT Recommendations:  24 Hour Supervision, [place] / Referral to community resources:  [place]  Patient/Family's Response to care:  Appreciative and understanding.  Patient/Family's Understanding of and Emotional Response to Diagnosis, Current Treatment,  and Prognosis:  Daughter very emotional, voices exhaustion and frustration with dx and prognosis.  Aware of options and current treatment plan.    Emotional Assessment Appearance:  Appears older than stated age Attitude/Demeanor/Rapport:  Guarded, Lethargic Affect (typically observed):  Constricted, Other (confused) Orientation:  Oriented to Self Alcohol / Substance use:  Alcohol Use Psych involvement (Current and /or in the community):  No (Comment)  Discharge Needs  Concerns to be addressed:  Home Safety Concerns, Lack of Support, Financial / Insurance Concerns Readmission within the last 30 days:  No Current discharge risk:  Lives alone, Cognitively Impaired, Terminally ill Barriers to Discharge:  Continued Medical Work up, Inadequate or no insurance   Erika Hunter, [place] 01/28/2017, 10:59 AM

## 2017-01-28 NOTE — Progress Notes (Signed)
Bed alarm going off and Hortencia Conradi Rn responded to the alarm. As Hortencia Conradi opened the door the witness the patient sitting down to the floor. No acute injury noted to the patient . Patient denies pain and any other discomfort. Patient did stated "My pride is only thing that got hurt."  K Schorr NP notified. Will continue to monitor and assess patient and family needs. Will reinforce safety education to patient and family.

## 2017-01-28 NOTE — Progress Notes (Signed)
Physical Therapy Treatment Patient Details Name: Erika Hunter MRN: [id number] DOB: Apr 27, 1971 Today's Date: 01/28/2017    History of Present Illness 46 y.o. female  with past medical history of EtOH abuse, COPD, anxiety, and HLD who presented to the ED with worsening headaches. Her daughter notes cognitive deficits concurrently with the onset of headaches. She was subsequently admitted on 01/18/2017 when brain imaging revealed three mass lesions, with associated vasogenic edema . CT chest then showed a large LUL mass encasing the left main pulmonary artery,  invading the left pulmonary veins and left atrium, and extending into the distal left mainstem bronchus.     PT Comments    RN in process of taking orthostatic BP. See Doc Flowsheets at 1145. The patient keeps eyes closed through out and became very unsteady for standing only briefly with drop in BP.   The patient is very unsteady and unable to tolerate ambulation today.   Follow Up Recommendations  SNF;Supervision/Assistance - 24 hour     Equipment Recommendations   (TBD)    Recommendations for Other Services       Precautions / Restrictions Precautions Precautions: Fall Precaution Comments: has jerking motions at times    Mobility  Bed Mobility Overal bed mobility: Needs Assistance Bed Mobility: Supine to Sit;Sit to Supine     Supine to sit: Min guard Sit to supine: Supervision   General bed mobility comments: RN taking orthostatic BP's, extra time to mobilize to sitting at the bed edge, was sitting upright in the bed previously.   Transfers Overall transfer level: Needs assistance Equipment used: Rolling walker (2 wheeled) Transfers: Sit to/from Stand Sit to Stand: Min assist;Mod assist;+2 safety/equipment         General transfer comment: the patient stood from the bed at the RW for BP check, after less than 1 minute the patient began to jerk,complained of feeoling dizzy although eyes were closed. the patient  droped back onto bed and placed her legs onto the bed.   Ambulation/Gait                 Stairs            Wheelchair Mobility    Modified Rankin (Stroke Patients Only)       Balance           Standing balance support: During functional activity;Bilateral upper extremity supported Standing balance-Leahy Scale: Poor Standing balance comment: due to tremors and shaking, required assist to stand up briefly                    Cognition Arousal/Alertness: Lethargic Behavior During Therapy: Flat affect Overall Cognitive Status: Impaired/Different from baseline Area of Impairment: Following commands;Problem solving;Memory;Safety/judgement       Following Commands: Follows one step commands with increased time     Problem Solving: Slow processing;Decreased initiation;Requires verbal cues;Requires tactile cues      Exercises      General Comments        Pertinent Vitals/Pain Pain Assessment: No/denies pain    Home Living                      Prior Function            PT Goals (current goals can now be found in the care plan section) Progress towards PT goals: Not progressing toward goals - comment (remains dizzy)    Frequency    Min 3X/week      PT Plan  Current plan remains appropriate    Co-evaluation             End of Session   Activity Tolerance: Patient limited by fatigue;Treatment limited secondary to medical complications (Comment) Patient left: in bed;with call bell/phone within reach;with bed alarm set Nurse Communication: Mobility status PT Visit Diagnosis: Other abnormalities of gait and mobility (R26.89);Other symptoms and signs involving the nervous system (R29.898);Dizziness and giddiness (R42)     Time: 1141-1150 PT Time Calculation (min) (ACUTE ONLY): 9 min  Charges:  $Therapeutic Activity: 8-22 mins                    G Codes:       Marcelino Freestone  PT [phone]  01/28/2017, 12:41 PM

## 2017-01-28 NOTE — Clinical Social Work Placement (Signed)
   CLINICAL SOCIAL WORK PLACEMENT  NOTE  Date:  01/28/2017  Patient Details  Name: Erika Hunter MRN: [id number] Date of Birth: 1971-08-17  Clinical Social Work is seeking post-discharge placement for this patient at the [place] level of care (*CSW will initial, date and re-position this form in  chart as items are completed):  Yes   Patient/family provided with [facility] Work Department's list of facilities offering this level of care within the geographic area requested by the patient (or if unable, by the patient's family).  Yes   Patient/family informed of their freedom to choose among providers that offer the needed level of care, that participate in Medicare, Medicaid or managed care program needed by the patient, have an available bed and are willing to accept the patient.  Yes   Patient/family informed of [facility]'s ownership interest in [facility] and [facility], as well as of the fact that they are under no obligation to receive care at these facilities.  PASRR submitted to EDS on 01/28/17     PASRR number received on 01/28/17     Existing PASRR number confirmed on       FL2 transmitted to all facilities in geographic area requested by pt/family on 01/28/17     FL2 transmitted to all facilities within larger geographic area on       Patient informed that his/her managed care company has contracts with or will negotiate with certain facilities, including the following:            Patient/family informed of bed offers received.  Patient chooses bed at       Physician recommends and patient chooses bed at      Patient to be transferred to   on  .  Patient to be transferred to facility by       Patient family notified on   of transfer.  Name of family member notified:        PHYSICIAN Please sign FL2     Additional Comment:    _______________________________________________ Lilly Cove, LCSW 01/28/2017, 11:39  AM

## 2017-01-28 NOTE — Progress Notes (Signed)
LCSW has discussed case with Director of SW: Nathaniel Man and Dr. Reynaldo Minium regarding disposition and placement considerations.  Patient will be discussed in LOS meeting tomorrow 3/13.  Discussed case with SNF Liaison: Tammy regarding possible bed offer.  Barriers:  No disability in place or applied, transportation, payment regarding SNF stay, and plans for patient to move to [place].  Discussed in depth with daughter plans regarding [place].  Daughter reports patient will discharge to [facility] with her best friend via airplane or rental RV that will be at family expense.  Daughter was asked the following: 1. Where will patient continue to receive her medical care in [place] in effort for [place] to plan to transfer care to [facility]:  Response: unclear, but working with best friend of patient and will find out. 2. Daughter reports friend will be applying for medicaid and disability for patient in [place], but currently has not. 3. Friend to arrive on Thursday and will be part of the transport back to [place]. 4. Patient has been unable to stand and attempted to stand today, but BP dropped and required assistance.  Unclear if she is able to sit in chair as she has remained in hospital bed.   LCSW will continue to work with Public librarian in effort to address difficult psycho-social situation regarding payor source for SNF, identify SNF bed, and projected time frame.     Patient has been faxed out and currently has no bed offers.  Lane Hacker, MSW Clinical Social Work: [employer] Coverage for :  [phone]

## 2017-01-28 NOTE — NC FL2 (Signed)
[place] LEVEL OF CARE SCREENING TOOL     IDENTIFICATION  Patient Name: Erika Hunter Birthdate: May 01, 1971 Sex: female Admission Date (Current Location): 01/18/2017  [facility] and [place] Number:  Herbalist and Address:  [facility],  [place] [address] [place]      Provider Number: [id number]  Attending Physician Name and Address:  Debbe Odea, MD  Relative Name and Phone Number:       Current Level of Care: Hospital Recommended Level of Care: [place] Prior Approval Number:    Date Approved/Denied:   PASRR Number:   [id number]   Discharge Plan: SNF    Current Diagnoses: Patient Active Problem List   Diagnosis Date Noted  . Orthostatic hypotension 01/26/2017  . Goals of care, counseling/discussion   . Palliative care by specialist   . Alcohol dependence ([place]) 01/24/2017  . Headache 01/24/2017  . COPD (chronic obstructive pulmonary disease) ([place]) 01/24/2017  . Brain metastasis ([place]) 01/23/2017  . Small cell lung cancer, left ([place]) 01/22/2017  . Lung mass   . Metastatic disease ([place])   . Brain tumor ([place]) 01/18/2017  . Depression 09/11/2016  . History of abnormal cervical Pap smear 09/11/2016  . Hyperlipidemia with target low density lipoprotein (LDL) cholesterol less than 100 mg/dL 04/17/2012  . Generalized anxiety disorder 04/17/2012  . Tobacco user 04/17/2012    Orientation RESPIRATION BLADDER Height & Weight     Self  Normal Incontinent Weight: 117 lb 14.4 oz (53.5 kg) Height:  '5\' 5"'$  (165.1 cm)  BEHAVIORAL SYMPTOMS/MOOD NEUROLOGICAL BOWEL NUTRITION STATUS      Continent Diet (See DC summary)  AMBULATORY STATUS COMMUNICATION OF NEEDS Skin   Extensive Assist Verbally Surgical wounds                       Personal Care Assistance Level of Assistance  Bathing, Feeding, Dressing Bathing Assistance: Limited assistance Feeding assistance: Limited assistance Dressing Assistance: Limited  assistance     Functional Limitations Info  Sight, Hearing, Speech Sight Info: Adequate Hearing Info: Adequate Speech Info: Adequate    SPECIAL CARE FACTORS FREQUENCY  PT (By licensed PT), OT (By licensed OT)     PT Frequency: 5x OT Frequency: 5x            Contractures Contractures Info: Not present    Additional Factors Info  Code Status, Allergies Code Status Info: Full Code Allergies Info: NKA           Current Medications (01/28/2017):  This is the current hospital active medication list Current Facility-Administered Medications  Medication Dose Route Frequency Provider Last Rate Last Dose  . 0.9 %  sodium chloride infusion   Intravenous Continuous Debbe Odea, MD 125 mL/hr at 01/28/17 0315    . acetaminophen (TYLENOL) tablet 650 mg  650 mg Oral Q6H PRN Ashok Pall, MD   650 mg at 01/23/17 0235   Or  . acetaminophen (TYLENOL) suppository 650 mg  650 mg Rectal Q6H PRN Ashok Pall, MD      . alum & mag hydroxide-simeth (MAALOX/MYLANTA) 200-200-20 MG/5ML suspension 30 mL  30 mL Oral Q4H PRN Ashok Pall, MD   30 mL at 01/20/17 0933  . bisacodyl (DULCOLAX) EC tablet 5 mg  5 mg Oral Daily PRN Ashok Pall, MD   5 mg at 01/23/17 0944  . bisacodyl (DULCOLAX) suppository 10 mg  10 mg Rectal Once Debbe Odea, MD      . dexamethasone (  DECADRON) tablet 6 mg  6 mg Oral Q6H Ashok Pall, MD   6 mg at 01/28/17 0529  . enoxaparin (LOVENOX) injection 40 mg  40 mg Subcutaneous Q24H Karmen Bongo, MD   40 mg at 01/27/17 1112  . feeding supplement (ENSURE ENLIVE) (ENSURE ENLIVE) liquid 237 mL  237 mL Oral BID BM Saima Rizwan, MD      . feeding supplement (PRO-STAT SUGAR FREE 64) liquid 30 mL  30 mL Oral BID Ashok Pall, MD   30 mL at 01/25/17 2141  . fludrocortisone (FLORINEF) tablet 0.1 mg  0.1 mg Oral Daily Debbe Odea, MD   0.1 mg at 01/27/17 1436  . folic acid (FOLVITE) tablet 1 mg  1 mg Oral Daily Ashok Pall, MD   1 mg at 01/27/17 1112  . guaiFENesin-dextromethorphan  (ROBITUSSIN DM) 100-10 MG/5ML syrup 5 mL  5 mL Oral Q4H PRN Ashok Pall, MD   5 mL at 01/19/17 0835  . ipratropium-albuterol (DUONEB) 0.5-2.5 (3) MG/3ML nebulizer solution 3 mL  3 mL Nebulization Q2H PRN Eugenie Filler, MD      . levETIRAcetam (KEPPRA) tablet 500 mg  500 mg Oral BID Ashok Pall, MD   500 mg at 01/27/17 2259  . magnesium citrate solution 1 Bottle  1 Bottle Oral Once PRN Ashok Pall, MD      . midodrine (PROAMATINE) tablet 5 mg  5 mg Oral TID WC Debbe Odea, MD   5 mg at 01/28/17 0851  . mometasone-formoterol (DULERA) 100-5 MCG/ACT inhaler 2 puff  2 puff Inhalation BID Eugenie Filler, MD   2 puff at 01/26/17 [phone]  . multivitamin with minerals tablet 1 tablet  1 tablet Oral Daily Ashok Pall, MD   1 tablet at 01/27/17 1112  . nicotine (NICODERM CQ - dosed in mg/24 hours) patch 21 mg  21 mg Transdermal Daily Ashok Pall, MD   21 mg at 01/27/17 1112  . ondansetron (ZOFRAN) tablet 4 mg  4 mg Oral Q6H PRN Ashok Pall, MD       Or  . ondansetron [facility]) injection 4 mg  4 mg Intravenous Q6H PRN Ashok Pall, MD   4 mg at 01/23/17 0445  . oxyCODONE (Oxy IR/ROXICODONE) immediate release tablet 5 mg  5 mg Oral Q4H PRN Ashok Pall, MD   5 mg at 01/28/17 0848  . pantoprazole (PROTONIX) EC tablet 40 mg  40 mg Oral Q0600 Eugenie Filler, MD   40 mg at 01/28/17 0530  . polyvinyl alcohol (LIQUIFILM TEARS) 1.4 % ophthalmic solution 1 drop  1 drop Both Eyes PRN Ashok Pall, MD      . senna (SENOKOT) tablet 8.6 mg  1 tablet Oral BID Ashok Pall, MD   8.6 mg at 01/27/17 1112  . sodium chloride flush (NS) 0.9 % injection 3 mL  3 mL Intravenous Q12H Ashok Pall, MD   3 mL at 01/25/17 1011  . sodium chloride flush (NS) 0.9 % injection 3 mL  3 mL Intravenous PRN Ashok Pall, MD      . thiamine (VITAMIN B-1) tablet 100 mg  100 mg Oral Daily Ashok Pall, MD   100 mg at 01/27/17 1111  . tiotropium (SPIRIVA) inhalation capsule 18 mcg  18 mcg Inhalation Daily Irine Seal V, MD      .  venlafaxine XR (EFFEXOR-XR) 24 hr capsule 225 mg  225 mg Oral Q breakfast Ashok Pall, MD   225 mg at 01/28/17 0851  . zolpidem (AMBIEN) tablet 5 mg  5 mg Oral QHS PRN Ashok Pall, MD   5 mg at 01/27/17 2357     Discharge Medications: Please see discharge summary for a list of discharge medications.  Relevant Imaging Results:  Relevant Lab Results:   Additional Information Palliative Care to follow at facility.    SSN:  [id number]   Patient currently recieving daily radiation at [facility] with last treatment scheduled for 3/21  Lilly Cove, [place]

## 2017-01-29 ENCOUNTER — Ambulatory Visit
Admit: 2017-01-29 | Discharge: 2017-01-29 | Disposition: A | Discharge status: Home / Self Care | Payer: Medicaid Other | Attending: Radiation Oncology | Admitting: Radiation Oncology

## 2017-01-29 DIAGNOSIS — R51 Headache: Secondary | ICD-10-CM

## 2017-01-29 DIAGNOSIS — I951 Orthostatic hypotension: Secondary | ICD-10-CM

## 2017-01-29 DIAGNOSIS — R5383 Other fatigue: Secondary | ICD-10-CM

## 2017-01-29 MED ORDER — FLUDROCORTISONE ACETATE 0.1 MG PO TABS
0.3000 mg | ORAL_TABLET | Freq: Every day | ORAL | Status: DC
Start: 2017-01-30 — End: 2017-01-31
  Administered 2017-01-30: 0.3 mg via ORAL
  Filled 2017-01-29 (×2): qty 3

## 2017-01-29 NOTE — Progress Notes (Signed)
Erika Hunter   DOB:1971-01-23   EZ#:[id number]   YTK#:[id number]  Oncology follow-up note  Subjective: Erika Hunter has been tolerating radiation well. She complains of fatigue, and spends most of time in the bed. She was found to be orthostatic hypotensive when she stands up, she was able to walk to the bathroom earlier today. Her roommate/friend was at bedside. She is eating OK.   Objective:  Vitals:   01/29/17 1325 01/29/17 2016  BP: (!) 145/74 131/82  Pulse: (!) 50 60  Resp: 16 14  Temp: 98.9 F (37.2 C) 98.4 F (36.9 C)    Body mass index is 19.62 kg/m.  Intake/Output Summary (Last 24 hours) at 01/29/17 2206 Last data filed at 01/29/17 1325  Gross per 24 hour  Intake              720 ml  Output              900 ml  Net             -180 ml     Sclerae unicteric  Oropharynx clear  No peripheral adenopathy  Lungs clear -- no rales or rhonchi  Heart regular rate and rhythm  Abdomen benign  MSK no focal spinal tenderness, no peripheral edema    CBG (last 3)  No results for input(s): GLUCAP in the last 72 hours.   Labs:  Lab Results  Component Value Date   WBC 11.8 (H) 01/25/2017   HGB 14.2 01/25/2017   HCT 42.3 01/25/2017   MCV 93.2 01/25/2017   PLT 398 01/25/2017   NEUTROABS 8.2 (H) 01/24/2017    CMP Latest Ref Rng & Units 01/27/2017 01/25/2017 01/24/2017  Glucose 65 - 99 mg/dL 141(H) 122(H) 110(H)  BUN 6 - 20 mg/dL 18 28(H) 29(H)  Creatinine 0.44 - 1.00 mg/dL 0.52 0.52 0.53  Sodium 135 - 145 mmol/L 136 138 137  Potassium 3.5 - 5.1 mmol/L 4.0 4.0 3.9  Chloride 101 - 111 mmol/L 103 101 101  CO2 22 - 32 mmol/L '27 29 29  '$ Calcium 8.9 - 10.3 mg/dL 8.7(L) 9.3 9.1  Total Protein 6.5 - 8.1 g/dL - - -  Total Bilirubin 0.3 - 1.2 mg/dL - - -  Alkaline Phos 38 - 126 U/L - - -  AST 15 - 41 U/L - - -  ALT 14 - 54 U/L - - -    Urine Studies No results for input(s): UHGB, CRYS in the last 72 hours.  Invalid input(s): UACOL, UAPR, USPG, UPH, UTP, UGL, UKET, UBIL, UNIT, UROB,  ULEU, UEPI, UWBC, Erika Hunter, [place]  Basic Metabolic Panel:  Recent Labs Lab 01/24/17 0852 01/24/17 1048 01/24/17 1412 01/25/17 0419 01/27/17 1110  NA 137  --  137 138 136  K 4.2  --  3.9 4.0 4.0  CL 101  --  101 101 103  CO2 29  --  '29 29 27  '$ GLUCOSE 102*  --  110* 122* 141*  BUN 19  --  29* 28* 18  CREATININE 0.52  --  0.53 0.52 0.52  CALCIUM 8.9  --  9.1 9.3 8.7*  MG  --  1.9  --  2.1  --    GFR Estimated Creatinine Clearance: 73.4 mL/min (by C-G formula based on SCr of 0.52 mg/dL). Liver Function Tests: No results for input(s): AST, ALT, ALKPHOS, BILITOT, PROT, ALBUMIN in the last 168 hours. No results for input(s): LIPASE, AMYLASE in the last 168 hours. No results  for input(s): AMMONIA in the last 168 hours. Coagulation profile No results for input(s): INR, PROTIME in the last 168 hours.  CBC:  Recent Labs Lab 01/24/17 0852 01/24/17 1412 01/25/17 0419  WBC 10.9* 10.1 11.8*  NEUTROABS 8.2*  --   --   HGB 13.9 14.2 14.2  HCT 41.4 42.2 42.3  MCV 94.5 93.2 93.2  PLT 370 400 398   Cardiac Enzymes: No results for input(s): CKTOTAL, CKMB, CKMBINDEX, TROPONINI in the last 168 hours. BNP: Invalid input(s): POCBNP CBG: No results for input(s): GLUCAP in the last 168 hours. D-Dimer No results for input(s): DDIMER in the last 72 hours. Hgb A1c No results for input(s): HGBA1C in the last 72 hours. Lipid Profile No results for input(s): CHOL, HDL, LDLCALC, TRIG, CHOLHDL, LDLDIRECT in the last 72 hours. Thyroid function studies No results for input(s): TSH, T4TOTAL, T3FREE, THYROIDAB in the last 72 hours.  Invalid input(s): FREET3 Anemia work up No results for input(s): VITAMINB12, FOLATE, FERRITIN, TIBC, IRON, RETICCTPCT in the last 72 hours. Microbiology Recent Results (from the past 240 hour(s))  Surgical pcr screen     Status: None   Collection Time: 01/20/17  1:43 PM  Result Value Ref Range Status   MRSA, PCR NEGATIVE NEGATIVE Final    Staphylococcus aureus NEGATIVE NEGATIVE Final    Comment:        The Xpert SA Assay (FDA approved for NASAL specimens in patients over 55 years of age), is one component of a comprehensive surveillance program.  Test performance has been validated by [facility] for patients greater than or equal to 34 year old. It is not intended to diagnose infection nor to guide or monitor treatment.       Studies:  No results found.  Assessment: 46 y.o. female with 20 PY smoking history, presented with headaches and difficulty on performingroutine jobs, fatigue and weight loss.   1. 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 Multiple brain mets (3) with vasogenic edema, and midline shift, on dexamethasone 3. History of anxiety and panic attack  4. Anorexia  5. Deconditioning  6. Orthostatic hypotension, ? Paraneoplastic syndrome  Recommendations:  -She will continue WBRT -Agree with florinef, and IVF, may try compression stocks up to waist  -I strongly encourage her to sit in the chair and walk with support/walker, and monitor her orthostatic BP  -plan is to discharge her to SNF, not sure if she can go if she remains to have severe orthostatic hypotension -Her overall prognosis remains to be extremely poor -I will follow up   This document serves as a record of services personally performed by Truitt Merle, MD. It was created on her behalf by Maryla Morrow, a trained medical scribe. The creation of this record is based on the scribe's personal observations and the provider's statements to them. This document has been checked and approved by the attending provider.   Truitt Merle, MD 01/29/2017  10:06 PM

## 2017-01-29 NOTE — Progress Notes (Addendum)
PROGRESS NOTE    Erika Hunter   GXQ:[id number]  DOB: 1971-10-07  DOA: 01/18/2017 PCP: Reginia Forts, MD   Brief Narrative:  Erika Hunter an 46 y.o.female with COPD, smoking, ETOH abusewho presented to [facility] with headaches, memory loss, confusion. No vision problems. 40-60 pounds unintentional weight loss over 4-6 months. Some night sweats. Slight wheezing, perhaps some cough for the last few years.     In the ER, CT head was worrisome for brain metastases and patient initially admitted under the neurosurgical team and placed on Decadron. Oncology and CT surgery were consulted. CT chest showed LUL mass. Patient underwent bronchoscopy which was positive for small cell lung cancer.. Patient was deemed no longer craniotomy candidate, transferred to hospitalist service and then transferred to [facility] for initiation of brain radiation and possible systemic chemotherapy. Has been undergoing radiation but the day prior to coming to [facility], began to shake and fall when walking to the commode.  I found her to be severely orthostatic and began treatment for this   Today I have met the woman she lives with. They work together as Water quality scientist. She states that the patient was able to stand for long periods of time at work without dizziness but was having headaches which patient felt were sinus headaches.   Subjective: Still eating and drinking well. Feels light headed when she sits up and is no longer standing for orthostatics. Using a bed pan instead of commode now. No new complaints.   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 3/3 CT chest >> LUL lung mass 01/21/17 - biopsy by Dr Servando Snare >> small cell lung CA - evaluated by Dr Burr Medico - 3/8 transferred to [facility] long for radiaition - cont  Decadron  Anorexia - improved ( possibly due to Decadron) - I initially obtained palliative care eval as she was severely depressed, was taking meds intermittently in the hospital and was not eating or drinking - patient wanted to try for aggressive treatment and we encouraged her to eat well - palliative saw her as well and the decision was aggressive management-  - now doing much better with oral intake and medication compliance  Dizziness, weakness on standing Orthostatic hypotension/ autonomic instability  - noted to have dizziness and falls after short walk to commode  - orthostatics checked on 3/9- positive orthostatic vitals- no improvement despite aggressive hydration - etiology? H/o ETOH abuse. On Effexor which is listed as a cause but I am doubtful this her cause - started Midodrine 5 mg TID - HR now in 50s due to Midodrine and she remains orthostatic- due to bradycardia, will not increase Midodrine - Started Florinef - also on Decadron which will help with fluid retention - discussed with patient and daughter that we may not be able to resolve this and it will worsen if she gets dehydrated- they understand - Have advised her to call for help, when getting out of bed, sit up for at least 30 sec with her legs hanging down prior to standing. If she does not feel well enough to stand, she needs to lay back down and ask for a bedpan. - still dizzy with sitting- increase Florinef to 0.3 this afternoon- this is the max dose-     COPD (chronic obstructive pulmonary disease) /   Tobacco user -  has Albuterol inhaler listed on home meds - on Dulera, Nicotine patch and PRN nebs in hospital-   Anxiety/ depression?  - per home med list, takes, Xanax and high dose Effexor (224m) at home     Alcohol dependence   - per history - daughter states (without patient knowing) that she has been drinking about 40 years and has more than 1 bottle of wine daily  - the patient states she only has a couple  of glasses of wine daily and has not been drinking for longer than 10 yrs    DVT prophylaxis: Lovenox Code Status: Full code Family Communication: daughter who is POA Disposition Plan: SNF-  Consultants:   Initially admitted under neurosurgery Dr. CCyndy Freeze Radiation oncology, AWorthy Flank PA 01/22/2017  Oncology: Dr. FBurr Medico03/02/2017  CVTS: Dr Gerhardt/Dr. VLucianne Leitright 01/20/2017  [facility]: Dr. YLorin Mercy03/05/2017 Procedures:   Bronchoscopy with brushings and biopsy of left upper lobe mass per Dr.Gerhardt 01/21/2017 Antimicrobials:  Anti-infectives    None       Objective: Vitals:   01/28/17 0458 01/28/17 2209 01/29/17 0600 01/29/17 1325  BP: (!) 142/78 130/76 114/89 (!) 145/74  Pulse: (!) 54 66 60 (!) 50  Resp: 17 16  16   Temp: 97.6 F (36.4 C) 98.3 F (36.8 C) 97.9 F (36.6 C) 98.9 F (37.2 C)  TempSrc: Oral Oral Oral Oral  SpO2: 98% 95% 98% 98%  Weight:      Height:        Intake/Output Summary (Last 24 hours) at 01/29/17 1435 Last data filed at 01/29/17 0705  Gross per 24 hour  Intake           1577.5 ml  Output              825 ml  Net            752.5 ml   Filed Weights   01/18/17 1737 01/19/17 0357  Weight: 49.9 kg (110 lb) 53.5 kg (117 lb 14.4 oz)    Examination: General exam: Appears comfortable  HEENT: PERRLA, oral mucosa moist, no sclera icterus or thrush Respiratory system: Clear to auscultation. Respiratory effort normal. Cardiovascular system: S1 & S2 heard, RRR.  No murmurs  Gastrointestinal system: Abdomen soft, non-tender, nondistended. Normal bowel sound. No organomegaly Central nervous system: Alert and oriented - No focal neurological deficits. Extremities: No cyanosis, clubbing or edema Skin: No rashes or ulcers Psychiatry:  Abnormal mood and affect but now More talkative - better eye contact - does not converse much at all and answers questions with short responses-     Data Reviewed: I have personally reviewed following  labs and imaging studies  CBC:  Recent Labs Lab 01/24/17 0852 01/24/17 1412 01/25/17 0419  WBC 10.9* 10.1 11.8*  NEUTROABS 8.2*  --   --   HGB 13.9 14.2 14.2  HCT 41.4 42.2 42.3  MCV 94.5 93.2 93.2  PLT 370 400 3935  Basic Metabolic Panel:  Recent Labs Lab 01/24/17 0852 01/24/17 1048 01/24/17 1412 01/25/17 0419 01/27/17 1110  NA 137  --  137 138 136  K 4.2  --  3.9 4.0 4.0  CL 101  --  101 101 103  CO2 29  --  29 29 27   GLUCOSE 102*  --  110* 122* 141*  BUN 19  --  29* 28* 18  CREATININE 0.52  --  0.53 0.52 0.52  CALCIUM 8.9  --  9.1 9.3 8.7*  MG  --  1.9  --  2.1  --    GFR: Estimated Creatinine Clearance: 73.4 mL/min (by C-G formula based on SCr of 0.52 mg/dL). Liver Function Tests: No results for input(s): AST, ALT, ALKPHOS, BILITOT, PROT, ALBUMIN in the last 168 hours. No results for input(s): LIPASE, AMYLASE in the last 168 hours. No results for input(s): AMMONIA in the last 168 hours. Coagulation Profile: No results for input(s): INR, PROTIME in the last 168 hours. Cardiac Enzymes: No results for input(s): CKTOTAL, CKMB, CKMBINDEX, TROPONINI in the last 168 hours. BNP (last 3 results) No results for input(s): PROBNP in the last 8760 hours. HbA1C: No results for input(s): HGBA1C in the last 72 hours. CBG: No results for input(s): GLUCAP in the last 168 hours. Lipid Profile: No results for input(s): CHOL, HDL, LDLCALC, TRIG, CHOLHDL, LDLDIRECT in the last 72 hours. Thyroid Function Tests: No results for input(s): TSH, T4TOTAL, FREET4, T3FREE, THYROIDAB in the last 72 hours. Anemia Panel: No results for input(s): VITAMINB12, FOLATE, FERRITIN, TIBC, IRON, RETICCTPCT in the last 72 hours. Urine analysis:    Component Value Date/Time   COLORURINE YELLOW 01/20/2017 1558   APPEARANCEUR CLOUDY (A) 01/20/2017 1558   LABSPEC 1.019 01/20/2017 1558   PHURINE 7.0 01/20/2017 1558   GLUCOSEU NEGATIVE 01/20/2017 1558   HGBUR NEGATIVE 01/20/2017 1558    BILIRUBINUR NEGATIVE 01/20/2017 1558   BILIRUBINUR neg 08/24/2014 1703   KETONESUR NEGATIVE 01/20/2017 1558   PROTEINUR NEGATIVE 01/20/2017 1558   UROBILINOGEN 0.2 08/24/2014 1703   NITRITE NEGATIVE 01/20/2017 1558   LEUKOCYTESUR NEGATIVE 01/20/2017 1558   Sepsis Labs: @LABRCNTIP (procalcitonin:4,lacticidven:4) ) Recent Results (from the past 240 hour(s))  Surgical pcr screen     Status: None   Collection Time: 01/20/17  1:43 PM  Result Value Ref Range Status   MRSA, PCR NEGATIVE NEGATIVE Final   Staphylococcus aureus NEGATIVE NEGATIVE Final    Comment:        The Xpert SA Assay (FDA approved for NASAL specimens in patients over 15 years of age), is one component of a comprehensive surveillance program.  Test performance has been validated by [facility] for patients greater than or equal to 56 year old. It is not intended to diagnose infection nor to guide or monitor treatment.          Radiology Studies: No results found.    Scheduled Meds: . bisacodyl  10 mg Rectal Once  . dexamethasone  6 mg Oral Q6H  . enoxaparin (LOVENOX) injection  40 mg Subcutaneous Q24H  . feeding supplement (ENSURE ENLIVE)  237 mL Oral BID BM  . feeding supplement (PRO-STAT SUGAR FREE 64)  30 mL Oral BID  . fludrocortisone  0.1 mg Oral BID  . folic acid  1 mg Oral Daily  . levETIRAcetam  500 mg Oral BID  . midodrine  5 mg Oral TID WC  . mometasone-formoterol  2 puff Inhalation BID  . multivitamin with minerals  1 tablet Oral Daily  . nicotine  21 mg Transdermal Daily  . pantoprazole  40 mg Oral Q0600  . senna  1 tablet Oral BID  . sodium chloride flush  3 mL Intravenous Q12H  . thiamine  100 mg Oral Daily  . tiotropium  18 mcg Inhalation Daily  . venlafaxine XR  225 mg Oral Q breakfast   Continuous Infusions:    LOS: 11 days    Time spent in minutes: 49    Starke, MD [facility] Pager: [email or web address] Password [facility] 01/29/2017, 2:35 PM

## 2017-01-29 NOTE — Progress Notes (Signed)
Occupational Therapy Treatment Patient Details Name: Erika Hunter MRN: [id number] DOB: 07/20/1971 Today's Date: 01/29/2017    History of present illness 46 y.o. female  with past medical history of EtOH abuse, COPD, anxiety, and HLD who presented to the ED with worsening headaches. Her daughter notes cognitive deficits concurrently with the onset of headaches. She was subsequently admitted on 01/18/2017 when brain imaging revealed three mass lesions, with associated vasogenic edema . CT chest then showed a large LUL mass encasing the left main pulmonary artery,  invading the left pulmonary veins and left atrium, and extending into the distal left mainstem bronchus.    OT comments  Pt self limited tx today.  She was initially agreeable to OOB, but changed her mind.    Follow Up Recommendations  SNF    Equipment Recommendations  3 in 1 bedside commode    Recommendations for Other Services      Precautions / Restrictions Precautions Precautions: Fall Precaution Comments: has jerking motions at times Restrictions Weight Bearing Restrictions: No       Mobility Bed Mobility               General bed mobility comments: sat up to unsupported sitting in bed independently  Transfers                 General transfer comment: deferred at pt's request    Balance                                   ADL                                         General ADL Comments: pt was difficult to engage in functional activity. She was initially agreeable to sitting up in chair. Once set up, she did not want to get OOB.  Pt did sit up in bed without support and with legs crossed ([place] style).  Applied lotion. Did not want to engage in further activities. She thought that combing hair would make her hair hurt.  When asked if she might want a book to read or word search, she said that these activities would make her eyes hurt. Visitors encouraged activity and  said they would also assist her with OOB so she wouldn't fall.  They knew what TV shows she liked, and she was watching TV when cued to look up at TV monitor.      Vision                     Perception     Praxis      Cognition   Behavior During Therapy: [facility] for tasks assessed/performed            Following Commands: Follows one step commands with increased time     Problem Solving: Slow processing;Decreased initiation;Requires verbal cues;Requires tactile cues        Exercises     Shoulder Instructions       General Comments      Pertinent Vitals/ Pain       Pain Assessment: No/denies pain  Home Living  Prior Functioning/Environment              Frequency  Min 2X/week        Progress Toward Goals  OT Goals(current goals can now be found in the care plan section)  Progress towards OT goals: Progressing toward goals (slowly)  Acute Rehab OT Goals Time For Goal Achievement: 02/08/17  Plan      Co-evaluation                 End of Session    OT Visit Diagnosis: Unsteadiness on feet (R26.81);Muscle weakness (generalized) (M62.81)   Activity Tolerance Patient limited by fatigue   Patient Left in bed;with call bell/phone within reach;with bed alarm set;with family/visitor present   Nurse Communication          Time: 8527-7824 OT Time Calculation (min): 15 min  Charges: OT General Charges $OT Visit: 1 Procedure OT Treatments $Therapeutic Activity: 8-22 mins  Lesle Chris, OTR/L [phone] 01/29/2017   Xaria Judon 01/29/2017, 2:23 PM

## 2017-01-29 NOTE — Progress Notes (Signed)
CSW assisting with d/c planning. Tammy, liaison from [facility], is reviewing placement request with facility leadership and should have a decision shortly. CSW has updated pt's daughter, Velna Hatchet. CSW will continue to follow to assist with d/c planning.  Werner Lean LCSW [phone]

## 2017-01-30 ENCOUNTER — Ambulatory Visit
Admit: 2017-01-30 | Discharge: 2017-01-30 | Disposition: A | Discharge status: Home / Self Care | Payer: Medicaid Other | Attending: Radiation Oncology | Admitting: Radiation Oncology

## 2017-01-30 ENCOUNTER — Telehealth: Payer: Self-pay | Admitting: *Deleted

## 2017-01-30 ENCOUNTER — Ambulatory Visit
Admit: 2017-01-30 | Discharge: 2017-01-30 | Disposition: A | Discharge status: Home / Self Care | Payer: Self-pay | Attending: Radiation Oncology | Admitting: Radiation Oncology

## 2017-01-30 DIAGNOSIS — C799 Secondary malignant neoplasm of unspecified site: Secondary | ICD-10-CM

## 2017-01-30 DIAGNOSIS — J438 Other emphysema: Secondary | ICD-10-CM

## 2017-01-30 DIAGNOSIS — R918 Other nonspecific abnormal finding of lung field: Secondary | ICD-10-CM

## 2017-01-30 DIAGNOSIS — C7931 Secondary malignant neoplasm of brain: Secondary | ICD-10-CM

## 2017-01-30 DIAGNOSIS — Z720 Tobacco use: Secondary | ICD-10-CM

## 2017-01-30 LAB — BASIC METABOLIC PANEL
Anion gap: 8 (ref 5–15)
BUN: 21 mg/dL — ABNORMAL HIGH (ref 6–20)
CO2: 27 mmol/L (ref 22–32)
CREATININE: 0.5 mg/dL (ref 0.44–1.00)
Calcium: 8.6 mg/dL — ABNORMAL LOW (ref 8.9–10.3)
Chloride: 97 mmol/L — ABNORMAL LOW (ref 101–111)
GFR calc non Af Amer: >60 mL/min (ref >60)
Glucose, Bld: 159 mg/dL — ABNORMAL HIGH (ref 65–99)
Potassium: 3.4 mmol/L — ABNORMAL LOW (ref 3.5–5.1)
Sodium: 132 mmol/L — ABNORMAL LOW (ref 135–145)

## 2017-01-30 LAB — MAGNESIUM: MAGNESIUM: 1.9 mg/dL (ref 1.7–2.4)

## 2017-01-30 MED ORDER — SODIUM CHLORIDE 0.9 % IV SOLN: INTRAVENOUS | Status: DC

## 2017-01-30 MED ORDER — POTASSIUM CHLORIDE IN NACL 20-0.9 MEQ/L-% IV SOLN
INTRAVENOUS | Status: AC
  Administered 2017-01-30 (×2): via INTRAVENOUS
  Filled 2017-01-30 (×3): qty 1000

## 2017-01-30 MED ORDER — MIDODRINE HCL 5 MG PO TABS
10.0000 mg | ORAL_TABLET | Freq: Three times a day (TID) | ORAL | Status: DC
  Administered 2017-01-30 – 2017-01-31 (×3): 10 mg via ORAL
  Filled 2017-01-30 (×5): qty 2

## 2017-01-30 NOTE — Progress Notes (Signed)
CM following along for disposition planning. Plan at this time is for SNF placement. Marney Doctor RN,BSN,NCM [phone]

## 2017-01-30 NOTE — Progress Notes (Signed)
Department of Radiation Oncology  Phone:  [phone] Fax:        [phone]  Weekly Treatment Note    Name: Erika Hunter Date: 01/31/2017 MRN: [id number] DOB: 1971/06/28   Diagnosis:     ICD-9-CM ICD-10-CM   1. Brain metastasis (HCC) 198.3 C79.31      Current dose: 15 Gy  Current fraction: 5   MEDICATIONS: No current facility-administered medications for this encounter.    No current outpatient prescriptions on file.   Facility-Administered Medications Ordered in Other Encounters  Medication Dose Route Frequency Provider Last Rate Last Dose  . 0.9 % NaCl with KCl 20 mEq/ L  infusion   Intravenous Continuous [name], MD 125 mL/hr at 01/30/17 2314    . acetaminophen (TYLENOL) tablet 650 mg  650 mg Oral Q6H PRN Ashok Pall, MD   650 mg at 01/23/17 0235   Or  . acetaminophen (TYLENOL) suppository 650 mg  650 mg Rectal Q6H PRN Ashok Pall, MD      . alum & mag hydroxide-simeth (MAALOX/MYLANTA) 200-200-20 MG/5ML suspension 30 mL  30 mL Oral Q4H PRN Ashok Pall, MD   30 mL at 01/20/17 0933  . bisacodyl (DULCOLAX) EC tablet 5 mg  5 mg Oral Daily PRN Ashok Pall, MD   5 mg at 01/23/17 0944  . bisacodyl (DULCOLAX) suppository 10 mg  10 mg Rectal Once Debbe Odea, MD      . dexamethasone (DECADRON) tablet 6 mg  6 mg Oral Q6H Ashok Pall, MD   6 mg at 01/31/17 0520  . enoxaparin (LOVENOX) injection 40 mg  40 mg Subcutaneous Q24H Karmen Bongo, MD   40 mg at 01/30/17 0903  . feeding supplement (ENSURE ENLIVE) (ENSURE ENLIVE) liquid 237 mL  237 mL Oral BID BM Saima Rizwan, MD      . feeding supplement (PRO-STAT SUGAR FREE 64) liquid 30 mL  30 mL Oral BID Ashok Pall, MD   30 mL at 01/25/17 2141  . fludrocortisone (FLORINEF) tablet 0.3 mg  0.3 mg Oral Daily Debbe Odea, MD   0.3 mg at 01/30/17 1703  . folic acid (FOLVITE) tablet 1 mg  1 mg Oral Daily Ashok Pall, MD   1 mg at 01/30/17 0902  . guaiFENesin-dextromethorphan (ROBITUSSIN DM) 100-10 MG/5ML syrup 5 mL  5 mL  Oral Q4H PRN Ashok Pall, MD   5 mL at 01/19/17 0835  . ibuprofen (ADVIL,MOTRIN) tablet 400 mg  400 mg Oral Q4H PRN Debbe Odea, MD      . ipratropium-albuterol (DUONEB) 0.5-2.5 (3) MG/3ML nebulizer solution 3 mL  3 mL Nebulization Q2H PRN Eugenie Filler, MD      . levETIRAcetam (KEPPRA) tablet 500 mg  500 mg Oral BID Ashok Pall, MD   500 mg at 01/30/17 2311  . magnesium citrate solution 1 Bottle  1 Bottle Oral Once PRN Ashok Pall, MD      . midodrine (PROAMATINE) tablet 10 mg  10 mg Oral TID WC [name], MD   10 mg at 01/30/17 1702  . mometasone-formoterol (DULERA) 100-5 MCG/ACT inhaler 2 puff  2 puff Inhalation BID Eugenie Filler, MD   2 puff at 01/26/17 0947  . multivitamin with minerals tablet 1 tablet  1 tablet Oral Daily Ashok Pall, MD   1 tablet at 01/30/17 0903  . nicotine (NICODERM CQ - dosed in mg/24 hours) patch 21 mg  21 mg Transdermal Daily Ashok Pall, MD   21 mg at 01/27/17 1112  . ondansetron (  ZOFRAN) tablet 4 mg  4 mg Oral Q6H PRN Ashok Pall, MD   4 mg at 01/30/17 0902   Or  . ondansetron [facility]) injection 4 mg  4 mg Intravenous Q6H PRN Ashok Pall, MD   4 mg at 01/23/17 0445  . oxyCODONE (Oxy IR/ROXICODONE) immediate release tablet 5 mg  5 mg Oral Q4H PRN Ashok Pall, MD   5 mg at 01/28/17 0848  . pantoprazole (PROTONIX) EC tablet 40 mg  40 mg Oral Q0600 Eugenie Filler, MD   40 mg at 01/31/17 0520  . polyvinyl alcohol (LIQUIFILM TEARS) 1.4 % ophthalmic solution 1 drop  1 drop Both Eyes PRN Ashok Pall, MD      . senna (SENOKOT) tablet 8.6 mg  1 tablet Oral BID Ashok Pall, MD   8.6 mg at 01/30/17 2312  . sodium chloride flush (NS) 0.9 % injection 3 mL  3 mL Intravenous Q12H Ashok Pall, MD   3 mL at 01/30/17 0946  . sodium chloride flush (NS) 0.9 % injection 3 mL  3 mL Intravenous PRN Ashok Pall, MD      . thiamine (VITAMIN B-1) tablet 100 mg  100 mg Oral Daily Ashok Pall, MD   100 mg at 01/30/17 0945  . tiotropium (SPIRIVA) inhalation capsule 18  mcg  18 mcg Inhalation Daily Irine Seal V, MD      . venlafaxine XR (EFFEXOR-XR) 24 hr capsule 225 mg  225 mg Oral Q breakfast Ashok Pall, MD   225 mg at 01/30/17 0903  . zolpidem (AMBIEN) tablet 5 mg  5 mg Oral QHS PRN Ashok Pall, MD   5 mg at 01/29/17 2158     ALLERGIES: Patient has no known allergies.   LABORATORY DATA:  Lab Results  Component Value Date   WBC 11.8 (H) 01/25/2017   HGB 14.2 01/25/2017   HCT 42.3 01/25/2017   MCV 93.2 01/25/2017   PLT 398 01/25/2017   Lab Results  Component Value Date   NA 132 (L) 01/30/2017   K 3.4 (L) 01/30/2017   CL 97 (L) 01/30/2017   CO2 27 01/30/2017   Lab Results  Component Value Date   ALT 15 01/18/2017   AST 18 01/18/2017   ALKPHOS 65 01/18/2017   BILITOT 0.4 01/18/2017     NARRATIVE: Erika Hunter was seen today for weekly treatment management. The chart was checked and the patient's films were reviewed.  The patient was seen in the treatment area. Patient states she is doing well with treatment. Her daughter agrees and reports her mental state has improved with treatment. She is being discharged to a skilled nursing facility later today.   PHYSICAL EXAMINATION: vitals were not taken for this visit.     Alert and in no acute distress.  ASSESSMENT: The patient is doing satisfactorily with treatment.  PLAN: We will continue with the patient's radiation treatment as planned.    ------------------------------------------------  Jodelle Gross, MD, PhD  This document serves as a record of services personally performed by Kyung Rudd, MD. It was created on his behalf by Arlyce Harman, a trained medical scribe. The creation of this record is based on the scribe's personal observations and the provider's statements to them. This document has been checked and approved by the attending provider.

## 2017-01-30 NOTE — Progress Notes (Signed)
Pt returned from Radiation.

## 2017-01-30 NOTE — Telephone Encounter (Signed)
Called daughter,Brandi, informed her that the paperwork she requested has been completed by MD and I have taken the paperwork in a manilla envelope to the patient's room and left on top of computer next to patient's bed, sitter in room with the patient 1:34 PM

## 2017-01-30 NOTE — Progress Notes (Signed)
Pt left for radiation treatment.

## 2017-01-30 NOTE — Progress Notes (Signed)
CSW assisting with d/c planning. Pt has a SNF bed at [facility] today if stable for d/c. [facility] will provide 30 day LOG and will assist with cost of transportation to and from Radiation appts. Pt / daughter are aware and in agreement with dc plan.  Werner Lean LCSW [phone]

## 2017-01-30 NOTE — Progress Notes (Signed)
PROGRESS NOTE  Erika Hunter FAO:[id number] DOB: Jun 15, 1971 DOA: 01/18/2017 PCP: Reginia Forts, MD  Brief History:  46 y.o.female with COPD, smoking, ETOH abusewho presented to [facility] with headaches, memory loss, confusion. No vision problems. 40-60 pounds unintentional weight loss over 4-6 months. Some night sweats. Slight wheezing, perhaps some cough for the last few years.     In the ER, CT head was worrisome for brain metastases and patient initially admitted under the neurosurgical team and placed on Decadron. Oncology and CT surgery were consulted. CT chest showed LUL mass. Patient underwent bronchoscopy which was positive for small cell lung cancer.. Patient was deemed no longer craniotomy candidate, transferred to hospitalist service and then transferred to [facility] for initiation of brain radiation and possible systemic chemotherapy. Has been undergoing radiation but the day prior to coming to [facility], began to shake and fall when walking to the commode.  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 3/3 CT chest >> LUL lung mass 01/21/17 - biopsy by Dr Servando Snare >> small cell lung CA - evaluated by Dr Burr Medico - 3/8 transferred to [facility] for radiaition - cont Decadron  Anorexia - improved (likely due to Decadron) - I initially obtained palliative care eval as she was severely depressed, was taking meds intermittently in the hospital and was not eating or drinking - patient wanted to try for aggressive treatment and we encouraged her to eat well - palliative saw her as well and the decision was aggressive management-  - now doing much better with oral intake and medication compliance  Dizziness, weakness on  standing Orthostatic hypotension/ autonomic instability  - noted to have dizziness and falls after short walk to commode  - orthostatics checked on 3/9- positive orthostatic vitals- no improvement despite aggressive hydration - etiology? H/o ETOH abuse. On Effexor which is listed as a cause but I am doubtful this her cause - started Midodrine 5 mg TID - HR now in 50s due to Midodrine and she remains orthostatic -increase midodrine to 10 mg tid - also on Decadron which will help with fluid retention - discussed with patient and daughter that we may not be able to resolve this and it will worsen if she gets dehydrated- they understand - Have advised her to call for help, when getting out of bed, sit up for at least 30 sec with her legs hanging down prior to standing. If she does not feel well enough to stand, she needs to lay back down and ask for a bedpan. - still dizzy with sitting- increase Florinef to 0.3 mg - use abdominal binder -IVF another 24 hours    COPD (chronic obstructive pulmonary disease) /   Tobacco user - has Albuterol inhaler listed on home meds - on Dulera, Nicotine patch and PRN nebs in hospital-   Anxiety/ depression?  - per home med list, takes, Xanax and high dose Effexor ('225mg'$ ) at home     Alcohol dependence   - per history - daughter states (without patient knowing) that she has been drinking about 40 years and has more than 1 bottle of wine daily  - the patient states she only has a couple of glasses of wine daily  and has not been drinking for longer than 10 yrs       Disposition Plan:   SNF 3/15 if stable Family Communication:   Daughter updated at bedside   Consultants:  Initially admitted under neurosurgery Dr. Cyndy Freeze  Radiation oncology, Worthy Flank, PA 01/22/2017  Oncology: Dr. Feng03/02/2017  CVTS: Dr Gerhardt/Dr. Lucianne Lei tright 01/20/2017                             [facility]: Dr. Lorin Mercy 01/23/2017  Code Status:  FULL   DVT  Prophylaxis:  Kenvir Lovenox   Procedures:  Bronchoscopy with brushings and biopsy of left upper lobe mass per Dr.Gerhardt 01/21/2017  Antibiotics: None    Subjective: Patient still feeling dizzy when she first stands up. Denies any headaches, chest pain, shortness breath, nausea, vomiting, diarrhea, abdominal pain, dysuria, hematuria.  Objective: Vitals:   01/29/17 1325 01/29/17 2016 01/30/17 0447 01/30/17 1526  BP: (!) 145/74 131/82 131/80 140/72  Pulse: (!) 50 60 69 60  Resp: '16 14 14 16  '$ Temp: 98.9 F (37.2 C) 98.4 F (36.9 C) 98.4 F (36.9 C) 98.4 F (36.9 C)  TempSrc: Oral Oral Oral Oral  SpO2: 98% 98% 95% 100%  Weight:      Height:        Intake/Output Summary (Last 24 hours) at 01/30/17 1859 Last data filed at 01/30/17 0641  Gross per 24 hour  Intake              240 ml  Output              700 ml  Net             -460 ml   Weight change:  Exam:   General:  Pt is alert, follows commands appropriately, not in acute distress  HEENT: No icterus, No thrush, No neck mass, [place]/AT  Cardiovascular: RRR, S1/S2, no rubs, no gallops  Respiratory: CTA bilaterally, no wheezing, no crackles, no rhonchi  Abdomen: Soft/+BS, non tender, non distended, no guarding  Extremities: trace LE edema, No lymphangitis, No petechiae, No rashes, no synovitis   Data Reviewed: I have personally reviewed following labs and imaging studies Basic Metabolic Panel:  Recent Labs Lab 01/24/17 0852 01/24/17 1048 01/24/17 1412 01/25/17 0419 01/27/17 1110 01/30/17 1238  NA 137  --  137 138 136 132*  K 4.2  --  3.9 4.0 4.0 3.4*  CL 101  --  101 101 103 97*  CO2 29  --  '29 29 27 27  '$ GLUCOSE 102*  --  110* 122* 141* 159*  BUN 19  --  29* 28* 18 21*  CREATININE 0.52  --  0.53 0.52 0.52 0.50  CALCIUM 8.9  --  9.1 9.3 8.7* 8.6*  MG  --  1.9  --  2.1  --  1.9   Liver Function Tests: No results for input(s): AST, ALT, ALKPHOS, BILITOT, PROT, ALBUMIN in the last 168 hours. No results  for input(s): LIPASE, AMYLASE in the last 168 hours. No results for input(s): AMMONIA in the last 168 hours. Coagulation Profile: No results for input(s): INR, PROTIME in the last 168 hours. CBC:  Recent Labs Lab 01/24/17 0852 01/24/17 1412 01/25/17 0419  WBC 10.9* 10.1 11.8*  NEUTROABS 8.2*  --   --   HGB 13.9 14.2 14.2  HCT 41.4 42.2 42.3  MCV 94.5 93.2 93.2  PLT 370 400 398   Cardiac Enzymes:  No results for input(s): CKTOTAL, CKMB, CKMBINDEX, TROPONINI in the last 168 hours. BNP: Invalid input(s): POCBNP CBG: No results for input(s): GLUCAP in the last 168 hours. HbA1C: No results for input(s): HGBA1C in the last 72 hours. Urine analysis:    Component Value Date/Time   COLORURINE YELLOW 01/20/2017 1558   APPEARANCEUR CLOUDY (A) 01/20/2017 1558   LABSPEC 1.019 01/20/2017 1558   PHURINE 7.0 01/20/2017 1558   GLUCOSEU NEGATIVE 01/20/2017 1558   HGBUR NEGATIVE 01/20/2017 1558   BILIRUBINUR NEGATIVE 01/20/2017 1558   BILIRUBINUR neg 08/24/2014 1703   KETONESUR NEGATIVE 01/20/2017 1558   PROTEINUR NEGATIVE 01/20/2017 1558   UROBILINOGEN 0.2 08/24/2014 1703   NITRITE NEGATIVE 01/20/2017 1558   LEUKOCYTESUR NEGATIVE 01/20/2017 1558   Sepsis Labs: '@LABRCNTIP'$ (procalcitonin:4,lacticidven:4) )No results found for this or any previous visit (from the past 240 hour(s)).   Scheduled Meds: . bisacodyl  10 mg Rectal Once  . dexamethasone  6 mg Oral Q6H  . enoxaparin (LOVENOX) injection  40 mg Subcutaneous Q24H  . feeding supplement (ENSURE ENLIVE)  237 mL Oral BID BM  . feeding supplement (PRO-STAT SUGAR FREE 64)  30 mL Oral BID  . fludrocortisone  0.3 mg Oral Daily  . folic acid  1 mg Oral Daily  . levETIRAcetam  500 mg Oral BID  . midodrine  10 mg Oral TID WC  . mometasone-formoterol  2 puff Inhalation BID  . multivitamin with minerals  1 tablet Oral Daily  . nicotine  21 mg Transdermal Daily  . pantoprazole  40 mg Oral Q0600  . senna  1 tablet Oral BID  . sodium  chloride flush  3 mL Intravenous Q12H  . thiamine  100 mg Oral Daily  . tiotropium  18 mcg Inhalation Daily  . venlafaxine XR  225 mg Oral Q breakfast   Continuous Infusions: . 0.9 % NaCl with KCl 20 mEq / L 125 mL/hr at 01/30/17 1436    Procedures/Studies: Dg Chest 2 View  Result Date: 01/21/2017 CLINICAL DATA:  Known lung mass.  Chest pain. EXAM: CHEST  2 VIEW COMPARISON:  CT scan January 19, 2017 FINDINGS: The known left upper lobe lung mass is identified, extending into the left hilum. No other nodules or masses. No evidence of pneumonia. No pneumothorax. The heart, right hilum, and mediastinum are normal. The left hilum is obscured by the known mass. IMPRESSION: Known left upper lobe lung mass, better characterized on CT imaging. No other abnormalities. Electronically Signed   By: Dorise Bullion III M.D   On: 01/21/2017 09:44   Ct Head Wo Contrast  Result Date: 01/18/2017 CLINICAL DATA:  46 y/o  F; headache and nausea for 1 month. EXAM: CT HEAD WITHOUT CONTRAST TECHNIQUE: Contiguous axial images were obtained from the base of the skull through the vertex without intravenous contrast. COMPARISON:  None. FINDINGS: Brain: Left parietal occipital cystic lesion measuring 26 x 49 x 45 mm (AP x ML x CC series 2, image 17) without significant surrounding edema. Mixed attenuation mass centered in the right frontal lobe measuring 48 x 42 x 44 mm (series 2, image 15 and series 4, image 24). There is extensive surrounding vasogenic edema in the brain and local mass effect with right to left midline shift of 13 mm, right to left subfalcine herniation, right-sided uncal herniation, and partial effacement of the right lateral ventricle. Vascular: Mild calcific atherosclerosis of the cavernous and paraclinoid internal carotid arteries. Skull: Normal. Negative for fracture or focal lesion. Sinuses/Orbits: No acute finding.  Other: None. IMPRESSION: 1. Mixed attenuation mass in right frontal lobe with extensive  surrounding edema, 13 mm right to left midline shift, right to left subfalcine herniation, and right-sided uncal herniation. 2. Cystic lesion in the left parieto-occipital lobe of uncertain significance without surrounding edema. 3. MRI of the brain with and without contrast is recommended for further evaluation. These results were called by telephone at the time of interpretation on 01/18/2017 at 7:32 pm to Dr. Bryson [facility] , who verbally acknowledged these results. Electronically Signed   By: Kristine Garbe M.D.   On: 01/18/2017 19:34   Ct Chest W Contrast  Result Date: 01/19/2017 CLINICAL DATA:  Patient presented with brain lesions consistent with metastases. EXAM: CT CHEST, ABDOMEN, AND PELVIS WITH CONTRAST TECHNIQUE: Multidetector CT imaging of the chest, abdomen and pelvis was performed following the standard protocol during bolus administration of intravenous contrast. CONTRAST:  191m ISOVUE-300 IOPAMIDOL (ISOVUE-300) INJECTION 61% COMPARISON:  None. FINDINGS: CT CHEST FINDINGS Cardiovascular: Large enhancing left upper lobe mass encases the left main pulmonary artery with luminal narrowing. There is probable invasion into the left pulmonary vein and left atrium. The heart is normal in size. The thoracic aorta is normal in caliber. Mediastinum/Nodes: Large left upper lobe heterogeneous mass is contiguous with the left hilum. Separate adenopathy not demonstrated. No additional mediastinal adenopathy. No right hilar adenopathy. No axillary adenopathy. Lungs/Pleura: Large heterogeneous enhancing left upper lobe mass extends from the hilum anterior superiorly to the pleural surface. There is likely some degree of postobstructive atelectasis making exact measurements difficult, however measures at least 6.6 x 6.4 x 8.7 cm. There is encasement of the left main pulmonary artery and likely invasion into the left pulmonary veins and left atrium. There is extension into the distal left mainstem bronchus at  the bronchial bifurcation. No additional pulmonary nodule or mass. Moderate emphysema. No pleural fluid. Musculoskeletal: No blastic or evidence of destructive lytic lesions. There are no acute or suspicious osseous abnormalities. CT ABDOMEN PELVIS FINDINGS Hepatobiliary: No focal hepatic lesion. Gallbladder physiologically distended, no calcified stone. No biliary dilatation. Pancreas: No evidence of pancreatic mass. No ductal dilatation or inflammation. Spleen: Normal in size without focal abnormality. Adrenals/Urinary Tract: Adrenal glands are unremarkable. No adrenal nodule. Kidneys are normal, without renal calculi, focal lesion, or hydronephrosis. High-density material within the urinary bladder without bladder wall thickening. Stomach/Bowel: Lack of enteric contrast and paucity of body fat limits bowel assessment. Stomach is distended with ingested contents. No evidence of bowel wall thickening, distention or inflammation. No CT findings of focal mass. Vascular/Lymphatic: No retroperitoneal, pelvic, or mesenteric adenopathy. Mild aortic atherosclerosis without aneurysm. Reproductive: Uterus and bilateral adnexa are unremarkable. Other: There is subcutaneous nodule in the abdominal wall at the level of the umbilicus to the left of midline measuring 12 mm. No free air, free fluid, or intra-abdominal fluid collection. No omental or peritoneal thickening. Musculoskeletal: There are no acute or suspicious osseous abnormalities. No blastic or evident destructive lytic lesions. IMPRESSION: 1. Large (at least 8 cm) enhancing left upper lobe lung mass encasing the left main pulmonary artery and invading the left pulmonary veins and left atrium. There is extension into the distal left mainstem bronchus. 2. Nonspecific 12 mm subcutaneous nodule in the anterior abdominal wall, may be soft tissue metastasis or sebaceous cyst. 3. There is otherwise no evidence of primary or metastatic malignancy in the chest, abdomen, or  pelvis. 4. Moderate emphysema. These results will be called to the ordering clinician or representative by the Radiologist Assistant,  and communication documented in the PACS or zVision Dashboard. Electronically Signed   By: Jeb Levering M.D.   On: 01/19/2017 02:24   Mr Brain W And Wo Contrast  Result Date: 01/18/2017 CLINICAL DATA:  Initial evaluation for intracranial mass. EXAM: MRI HEAD WITHOUT AND WITH CONTRAST TECHNIQUE: Multiplanar, multiecho pulse sequences of the brain and surrounding structures were obtained without and with intravenous contrast. CONTRAST:  57m MULTIHANCE GADOBENATE DIMEGLUMINE 529 MG/ML IV SOLN COMPARISON:  Prior CT from earlier the same day. FINDINGS: Brain: Cerebral volume within normal limits. No significant cerebral white matter disease. Two large mass lesions are seen involving the bilateral cerebral hemispheres. Largest lesion is positioned within the right frontal lobe and measures 5.1 x 4.4 x 5.2 cm (series 12, image 24). Lesion is largely solid but demonstrates heterogeneous internal cystic components. Associated susceptibility artifact compatible with blood products. Heterogeneous post-contrast enhancement. Surrounding vasogenic edema throughout the right frontal region with associated 14 mm of right-to-left shift. Lateral ventricles are partially effaced without evidence for hydrocephalus or ventricular trapping at this time. Basilar cisterns R crowded but patent. Second lesion is positioned within the left occipital region and is predominantly cystic in nature with some internal soft tissue component posteriorly. Small amount of internal layering debris/hemorrhage as well. This lesion measures 5.7 x 3.4 x 5.0 cm (series 12, and image 24). Associated vasogenic edema within the left occipital region without significant mass effect. This lesion demonstrates peripheral rim enhancement. There is a third 5 mm focus of enhancement within the left parafalcine region (series 12,  image 37), suspicious for a third small lesion. Minimal associated edema noted within the adjacent cortical gray matter. No other definite lesions identified. Findings highly concerning for metastatic disease. No evidence for acute infarct. Gray-white matter differentiation otherwise maintained. No other areas of chronic infarction identified. No extra-axial fluid collection. Major dural sinuses are grossly patent. Pituitary gland grossly unremarkable. Vascular: Major intracranial vascular flow voids are maintained. ACA is are bowed to the left due to mass effect within the right frontal lobe. Skull and upper cervical spine: Craniocervical junction within normal limits. No herniation through the foramen magnum. Visualized upper cervical spine unremarkable. Bone marrow signal intensity normal. No scalp soft tissue abnormality. Sinuses/Orbits: Globes and orbital soft tissues within normal limits. Paranasal sinuses are clear. No mastoid effusion. Inner ear structures normal. Other: No other significant finding. 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 Extensive vasogenic edema about the lesion within the right frontal lobe with associated 14 mm of right-to-left shift. Lateral ventricles are partially effaced without hydrocephalus or evidence for ventricular trapping at this time. Electronically Signed   By: BJeannine BogaM.D.   On: 01/18/2017 21:46   Ct Abdomen Pelvis W Contrast  Result Date: 01/19/2017 CLINICAL DATA:  Patient presented with brain lesions consistent with metastases. EXAM: CT CHEST, ABDOMEN, AND PELVIS WITH CONTRAST TECHNIQUE: Multidetector CT imaging of the chest, abdomen and pelvis was performed following the standard protocol during bolus administration of intravenous contrast. CONTRAST:  1056mISOVUE-300 IOPAMIDOL (ISOVUE-300) INJECTION 61% COMPARISON:  None. FINDINGS: CT CHEST FINDINGS Cardiovascular: Large enhancing left upper  lobe mass encases the left main pulmonary artery with luminal narrowing. There is probable invasion into the left pulmonary vein and left atrium. The heart is normal in size. The thoracic aorta is normal in caliber. Mediastinum/Nodes: Large left upper lobe heterogeneous mass is contiguous with the left hilum. Separate adenopathy not demonstrated. No additional mediastinal adenopathy. No right hilar  adenopathy. No axillary adenopathy. Lungs/Pleura: Large heterogeneous enhancing left upper lobe mass extends from the hilum anterior superiorly to the pleural surface. There is likely some degree of postobstructive atelectasis making exact measurements difficult, however measures at least 6.6 x 6.4 x 8.7 cm. There is encasement of the left main pulmonary artery and likely invasion into the left pulmonary veins and left atrium. There is extension into the distal left mainstem bronchus at the bronchial bifurcation. No additional pulmonary nodule or mass. Moderate emphysema. No pleural fluid. Musculoskeletal: No blastic or evidence of destructive lytic lesions. There are no acute or suspicious osseous abnormalities. CT ABDOMEN PELVIS FINDINGS Hepatobiliary: No focal hepatic lesion. Gallbladder physiologically distended, no calcified stone. No biliary dilatation. Pancreas: No evidence of pancreatic mass. No ductal dilatation or inflammation. Spleen: Normal in size without focal abnormality. Adrenals/Urinary Tract: Adrenal glands are unremarkable. No adrenal nodule. Kidneys are normal, without renal calculi, focal lesion, or hydronephrosis. High-density material within the urinary bladder without bladder wall thickening. Stomach/Bowel: Lack of enteric contrast and paucity of body fat limits bowel assessment. Stomach is distended with ingested contents. No evidence of bowel wall thickening, distention or inflammation. No CT findings of focal mass. Vascular/Lymphatic: No retroperitoneal, pelvic, or mesenteric adenopathy. Mild  aortic atherosclerosis without aneurysm. Reproductive: Uterus and bilateral adnexa are unremarkable. Other: There is subcutaneous nodule in the abdominal wall at the level of the umbilicus to the left of midline measuring 12 mm. No free air, free fluid, or intra-abdominal fluid collection. No omental or peritoneal thickening. Musculoskeletal: There are no acute or suspicious osseous abnormalities. No blastic or evident destructive lytic lesions. IMPRESSION: 1. Large (at least 8 cm) enhancing left upper lobe lung mass encasing the left main pulmonary artery and invading the left pulmonary veins and left atrium. There is extension into the distal left mainstem bronchus. 2. Nonspecific 12 mm subcutaneous nodule in the anterior abdominal wall, may be soft tissue metastasis or sebaceous cyst. 3. There is otherwise no evidence of primary or metastatic malignancy in the chest, abdomen, or pelvis. 4. Moderate emphysema. These results will be called to the ordering clinician or representative by the Radiologist Assistant, and communication documented in the PACS or zVision Dashboard. Electronically Signed   By: Jeb Levering M.D.   On: 01/19/2017 02:24   Dg Chest Port 1 View  Result Date: 01/21/2017 CLINICAL DATA:  Post bronchoscopy. EXAM: PORTABLE CHEST 1 VIEW COMPARISON:  Chest radiograph January 21, 2017 at 0937 hours FINDINGS: Large LEFT hilar/ upper lobe mass, no pneumothorax. Increased lung volumes with mild chronic interstitial changes most compatible with COPD. No pleural effusion. LEFT proximal humerus presumed bone infarct. IMPRESSION: LEFT hilar/ upper lobe mass, COPD.  No pneumothorax. Electronically Signed   By: Elon Alas M.D.   On: 01/21/2017 18:33    Rodnesha Elie, DO  [facility] Pager [phone]  If 7PM-7AM, please contact night-coverage [email or web address] Password [email or web address] 01/30/2017, 6:59 PM   LOS: 12 days

## 2017-01-31 ENCOUNTER — Encounter: Payer: Self-pay | Admitting: *Deleted

## 2017-01-31 ENCOUNTER — Telehealth: Payer: Self-pay | Admitting: *Deleted

## 2017-01-31 ENCOUNTER — Ambulatory Visit
Admit: 2017-01-31 | Discharge: 2017-01-31 | Disposition: A | Discharge status: Home / Self Care | Payer: Medicaid Other | Attending: Radiation Oncology | Admitting: Radiation Oncology

## 2017-01-31 DIAGNOSIS — F1020 Alcohol dependence, uncomplicated: Secondary | ICD-10-CM

## 2017-01-31 LAB — CREATININE, SERUM
CREATININE: 0.49 mg/dL (ref 0.44–1.00)
GFR calc Af Amer: >60 mL/min (ref >60)

## 2017-01-31 MED ORDER — NICOTINE 21 MG/24HR TD PT24: 21.0000 mg | MEDICATED_PATCH | Freq: Every day | TRANSDERMAL | 0 refills | Status: DC

## 2017-01-31 MED ORDER — LEVETIRACETAM 500 MG PO TABS: 500.0000 mg | ORAL_TABLET | Freq: Two times a day (BID) | ORAL | 0 refills | Status: DC

## 2017-01-31 MED ORDER — DEXAMETHASONE 6 MG PO TABS: 6.0000 mg | ORAL_TABLET | Freq: Four times a day (QID) | ORAL | 0 refills | Status: DC

## 2017-01-31 MED ORDER — MIDODRINE HCL 10 MG PO TABS: 10.0000 mg | ORAL_TABLET | Freq: Three times a day (TID) | ORAL | 0 refills | Status: AC

## 2017-01-31 MED ORDER — POTASSIUM CHLORIDE CRYS ER 20 MEQ PO TBCR
20.0000 meq | EXTENDED_RELEASE_TABLET | Freq: Once | ORAL | Status: AC
  Administered 2017-01-31: 20 meq via ORAL
  Filled 2017-01-31: qty 1

## 2017-01-31 MED ORDER — FLUDROCORTISONE ACETATE 0.1 MG PO TABS: 0.3000 mg | ORAL_TABLET | Freq: Every day | ORAL | 0 refills | Status: AC

## 2017-01-31 MED ORDER — OXYCODONE HCL 5 MG PO TABS: 5.0000 mg | ORAL_TABLET | ORAL | 0 refills | Status: DC | PRN

## 2017-01-31 MED ORDER — ACETAMINOPHEN 325 MG PO TABS: 650.0000 mg | ORAL_TABLET | Freq: Four times a day (QID) | ORAL | 0 refills | Status: DC | PRN

## 2017-01-31 NOTE — Clinical Social Work Placement (Signed)
   CLINICAL SOCIAL WORK PLACEMENT  NOTE  Date:  01/31/2017  Patient Details  Name: Erika Hunter MRN: [id number] Date of Birth: 29-Jan-1971  Clinical Social Work is seeking post-discharge placement for this patient at the [place] level of care (*CSW will initial, date and re-position this form in  chart as items are completed):  Yes   Patient/family provided with [facility] Work Department's list of facilities offering this level of care within the geographic area requested by the patient (or if unable, by the patient's family).  Yes   Patient/family informed of their freedom to choose among providers that offer the needed level of care, that participate in Medicare, Medicaid or managed care program needed by the patient, have an available bed and are willing to accept the patient.  Yes   Patient/family informed of [facility]'s ownership interest in [facility] and [facility], as well as of the fact that they are under no obligation to receive care at these facilities.  PASRR submitted to EDS on 01/28/17     PASRR number received on 01/28/17     Existing PASRR number confirmed on       FL2 transmitted to all facilities in geographic area requested by pt/family on 01/28/17     FL2 transmitted to all facilities within larger geographic area on       Patient informed that his/her managed care company has contracts with or will negotiate with certain facilities, including the following:        Yes   Patient/family informed of bed offers received.  Patient chooses bed at  [facility])     Physician recommends and patient chooses bed at      Patient to be transferred to  [facility]) on 01/31/17.  Patient to be transferred to facility by [facility]     Patient family notified on 01/31/17 of transfer.  Name of family member notified:        PHYSICIAN Please sign FL2     Additional Comment: Pt / daughter are in agreement with d/c to  [facility] today. [facility] has provided 30 day LOG and will assist with cost of transportation to radiation appts. D/C Summary has been sent to SNF for review. Scripts included in d/c packet. # for report provided to nsg.   _______________________________________________ Luretha Rued, [place]  [phone] 01/31/2017, 12:16 PM
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
## 2017-01-31 NOTE — Discharge Summary (Signed)
Physician Discharge Summary  Erika Hunter YWV:[id number] DOB: 1971-10-27 DOA: 01/18/2017  PCP: Reginia Forts, MD  Admit date: 01/18/2017 Discharge date: 01/31/2017  Admitted From: Home Disposition:   SNF  Recommendations for Outpatient Follow-up:  1. Follow up with PCP in 1-2 weeks 2. Please obtain BMP/CBC in one week 3. Please transport patient to [facility] for radiation therapy daily 4. Follow up with medical oncology in 2-3 weeks, Dr. Truitt Merle    Discharge Condition: Stable CODE STATUS: FULL Diet recommendation: regular  Brief/Interim Summary: 46 y.o.female with COPD, smoking, ETOH abusewho presented to [facility] with headaches, memory loss, confusion. No vision problems. 40-60 pounds unintentional weight loss over 4-6 months. Some night sweats. Slight wheezing, perhaps some cough for the last few years.   In the ER, CT head was worrisome for brain metastases and patient initially admitted under the neurosurgical team and placed on Decadron. Oncology and CT surgery were consulted. CT chest showed LUL mass. Patient underwent bronchoscopy which was positive for small cell lung cancer.. Patient was deemed no longer craniotomy candidate, transferred to hospitalist service and then transferred to [facility] for initiation of brain radiation and possible systemic chemotherapy. Has been undergoing radiation but the day prior to coming to [facility], began to shake and fall when walking to the commode. The patient was found to have orthostatic hypotension. She was started on Midodrine and subsequently Florinef. Abdominal binder was also added. Although the patient continued to have some orthostasis, her drops in systolic blood pressure were less than prior to introduction of pharmacologic therapy.  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 3/3 CT chest >> LUL lung mass 01/21/17 - biopsy by Dr Servando Snare >> small cell lung CA - evaluated by Dr Burr Medico - 3/8 transferred to [facility] for radiaition - cont Decadron  Anorexia - improved (likely due to Decadron) - I initially obtained palliative care eval as she was severelydepressed, wastaking meds intermittently in the hospital and wasnot eating or drinking - patient wantedto try for aggressive treatment and we encouraged her to eat well - palliative saw her as well and the decision was aggressive management-  - now doing muchbetter with oral intake and medication compliance  Dizziness, weakness on standing Orthostatic hypotension/ autonomic instability - noted to have dizziness and falls after short walk to commode  - orthostatics checked on 3/9- positive orthostatic vitals- no improvement despite aggressive hydration - etiology? H/o ETOH abuse. On Effexor which is listed as a cause but I am doubtful this her cause - started Midodrine 5 mg TID - HR now in 50s due to Midodrine and she remains orthostatic -increase midodrine to 10 mg tid - also on Decadron which will help with fluid retention - discussed with patient and daughter that we may not be able to resolve this and it will worsen if she gets dehydrated- they understand - Have advised her to call for help, when getting out of bed, sit up for at least 30 sec with her legs hanging down prior to standing. If she does not feel well enough to stand, she needs to lay back down and ask for a bedpan. - still dizzy with sitting- increase Florinef to 0.3 mg - use abdominal binder -Although the patient remains orthostatic, her drops and the patient's systolic blood pressure with  positional changes were less than prior to starting pharmacologic therapy. Systolic BP drops ~70 mmHg compared to 30 to 40 mmHg drops before treatment. 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 -pt should wear abdominal binder at all times except when she goes to sleep at night  COPD (chronic obstructive pulmonary disease) / Tobacco user - has Albuterol inhaler listed on home meds - on Dulera, Nicotine patch and PRN nebs in hospital-   Anxiety/ depression - per home med list, takes, Xanax and high dose Effexor ('225mg'$ ) at home  Alcohol dependence  - per history - daughter states (without patient knowing) that she has been drinking about 40 years and has more than 1 bottle of wine daily - the patient states she only has a couple of glasses of wine daily and has not been drinking for longer than 10 yrs -no signs of withdrawal during the hospitalization   Discharge Instructions  Discharge Instructions    Diet - low sodium heart healthy    Complete by:  As directed    Increase activity slowly    Complete by:  As directed      Allergies as of 01/31/2017   No Known Allergies     Medication List    STOP taking these medications   ibuprofen 200 MG tablet Commonly known as:  ADVIL,MOTRIN   MUCINEX SINUS-MAX 5-10-200-325 MG Caps Generic drug:  Phenylephrine-DM-GG-APAP     TAKE these medications   acetaminophen 325 MG tablet Commonly known as:  TYLENOL Take 2 tablets (650 mg total) by mouth every 6 (six) hours as needed for mild pain (or Fever >/= 101).   albuterol 108 (90 Base) MCG/ACT inhaler Commonly known as:  PROAIR HFA INHALE 2 PUFFS INTO LUNGS EVERY 4 HOURS AS NEEDED FOR COUGH, SHORTNESS OF BREATH, OR WHEEZING   ALPRAZolam 0.5 MG tablet Commonly known as:  XANAX TAKE ONE TABLET BY MOUTH ONCE DAILY FOR ANXIETY AS DIRECTED What changed:  See the new instructions.   ARTIFICIAL TEARS 1.4 % ophthalmic solution Generic drug:  polyvinyl alcohol Place 1 drop into both eyes as needed for dry eyes.   dexamethasone 6 MG tablet Commonly known as:  DECADRON Take 1 tablet (6 mg total) by mouth every 6  (six) hours.   emollient cream Commonly known as:  BIAFINE Apply 1 application topically daily. zpply  Cream to head and left chest after radiation daily once skin becomes reddened , irritated or itchy, do not apply biafine 4 hours prior to radiation treatments   fludrocortisone 0.1 MG tablet Commonly known as:  FLORINEF Take 3 tablets (0.3 mg total) by mouth daily.   levETIRAcetam 500 MG tablet Commonly known as:  KEPPRA Take 1 tablet (500 mg total) by mouth 2 (two) times daily.   midodrine 10 MG tablet Commonly known as:  PROAMATINE Take 1 tablet (10 mg total) by mouth 3 (three) times daily with meals.   MULTI-VITAMIN GUMMIES PO Take 2 each by mouth daily.   nicotine 21 mg/24hr patch Commonly known as:  NICODERM CQ - dosed in mg/24 hours Place 1 patch (21 mg total) onto the skin daily. Start taking on:  02/01/2017   oxyCODONE 5 MG immediate release tablet Commonly known as:  Oxy IR/ROXICODONE Take 1 tablet (5 mg total) by mouth every 4 (four) hours as needed for moderate pain.   venlafaxine XR 75 MG 24 hr capsule Commonly known as:  EFFEXOR-XR TAKE 1 CAPSULE  BY MOUTH ONCE DAILY ALONG WITH '150MG'$  CAPSULE   venlafaxine XR 150 MG 24 hr capsule Commonly known as:  EFFEXOR-XR TAKE 1 CAPSULE BY MOUTH ONCE DAILY ALONG WITH '75MG'$  CAPSULE       No Known Allergies  Consultations:  Initially admitted under neurosurgery Dr. Cyndy Freeze  Radiation oncology, Worthy Flank, PA 01/22/2017  Oncology: Dr. Feng03/02/2017  CVTS: Dr Gerhardt/Dr. Lucianne Lei tright 01/20/2017   Procedures/Studies: Dg Chest 2 View  Result Date: 01/21/2017 CLINICAL DATA:  Known lung mass.  Chest pain. EXAM: CHEST  2 VIEW COMPARISON:  CT scan January 19, 2017 FINDINGS: The known left upper lobe lung mass is identified, extending into the left hilum. No other nodules or masses. No evidence of pneumonia. No pneumothorax. The heart, right hilum, and mediastinum are normal. The left hilum is obscured by the known mass.  IMPRESSION: Known left upper lobe lung mass, better characterized on CT imaging. No other abnormalities. Electronically Signed   By: Dorise Bullion III M.D   On: 01/21/2017 09:44   Ct Head Wo Contrast  Result Date: 01/18/2017 CLINICAL DATA:  46 y/o  F; headache and nausea for 1 month. EXAM: CT HEAD WITHOUT CONTRAST TECHNIQUE: Contiguous axial images were obtained from the base of the skull through the vertex without intravenous contrast. COMPARISON:  None. FINDINGS: Brain: Left parietal occipital cystic lesion measuring 26 x 49 x 45 mm (AP x ML x CC series 2, image 17) without significant surrounding edema. Mixed attenuation mass centered in the right frontal lobe measuring 48 x 42 x 44 mm (series 2, image 15 and series 4, image 24). There is extensive surrounding vasogenic edema in the brain and local mass effect with right to left midline shift of 13 mm, right to left subfalcine herniation, right-sided uncal herniation, and partial effacement of the right lateral ventricle. Vascular: Mild calcific atherosclerosis of the cavernous and paraclinoid internal carotid arteries. Skull: Normal. Negative for fracture or focal lesion. Sinuses/Orbits: No acute finding. Other: None. IMPRESSION: 1. Mixed attenuation mass in right frontal lobe with extensive surrounding edema, 13 mm right to left midline shift, right to left subfalcine herniation, and right-sided uncal herniation. 2. Cystic lesion in the left parieto-occipital lobe of uncertain significance without surrounding edema. 3. MRI of the brain with and without contrast is recommended for further evaluation. These results were called by telephone at the time of interpretation on 01/18/2017 at 7:32 pm to Dr. Bryson Ha [facility] , who verbally acknowledged these results. Electronically Signed   By: Kristine Garbe M.D.   On: 01/18/2017 19:34   Ct Chest W Contrast  Result Date: 01/19/2017 CLINICAL DATA:  Patient presented with brain lesions consistent with  metastases. EXAM: CT CHEST, ABDOMEN, AND PELVIS WITH CONTRAST TECHNIQUE: Multidetector CT imaging of the chest, abdomen and pelvis was performed following the standard protocol during bolus administration of intravenous contrast. CONTRAST:  131m ISOVUE-300 IOPAMIDOL (ISOVUE-300) INJECTION 61% COMPARISON:  None. FINDINGS: CT CHEST FINDINGS Cardiovascular: Large enhancing left upper lobe mass encases the left main pulmonary artery with luminal narrowing. There is probable invasion into the left pulmonary vein and left atrium. The heart is normal in size. The thoracic aorta is normal in caliber. Mediastinum/Nodes: Large left upper lobe heterogeneous mass is contiguous with the left hilum. Separate adenopathy not demonstrated. No additional mediastinal adenopathy. No right hilar adenopathy. No axillary adenopathy. Lungs/Pleura: Large heterogeneous enhancing left upper lobe mass extends from the hilum anterior superiorly to the pleural surface. There is likely some degree of postobstructive atelectasis making  exact measurements difficult, however measures at least 6.6 x 6.4 x 8.7 cm. There is encasement of the left main pulmonary artery and likely invasion into the left pulmonary veins and left atrium. There is extension into the distal left mainstem bronchus at the bronchial bifurcation. No additional pulmonary nodule or mass. Moderate emphysema. No pleural fluid. Musculoskeletal: No blastic or evidence of destructive lytic lesions. There are no acute or suspicious osseous abnormalities. CT ABDOMEN PELVIS FINDINGS Hepatobiliary: No focal hepatic lesion. Gallbladder physiologically distended, no calcified stone. No biliary dilatation. Pancreas: No evidence of pancreatic mass. No ductal dilatation or inflammation. Spleen: Normal in size without focal abnormality. Adrenals/Urinary Tract: Adrenal glands are unremarkable. No adrenal nodule. Kidneys are normal, without renal calculi, focal lesion, or hydronephrosis.  High-density material within the urinary bladder without bladder wall thickening. Stomach/Bowel: Lack of enteric contrast and paucity of body fat limits bowel assessment. Stomach is distended with ingested contents. No evidence of bowel wall thickening, distention or inflammation. No CT findings of focal mass. Vascular/Lymphatic: No retroperitoneal, pelvic, or mesenteric adenopathy. Mild aortic atherosclerosis without aneurysm. Reproductive: Uterus and bilateral adnexa are unremarkable. Other: There is subcutaneous nodule in the abdominal wall at the level of the umbilicus to the left of midline measuring 12 mm. No free air, free fluid, or intra-abdominal fluid collection. No omental or peritoneal thickening. Musculoskeletal: There are no acute or suspicious osseous abnormalities. No blastic or evident destructive lytic lesions. IMPRESSION: 1. Large (at least 8 cm) enhancing left upper lobe lung mass encasing the left main pulmonary artery and invading the left pulmonary veins and left atrium. There is extension into the distal left mainstem bronchus. 2. Nonspecific 12 mm subcutaneous nodule in the anterior abdominal wall, may be soft tissue metastasis or sebaceous cyst. 3. There is otherwise no evidence of primary or metastatic malignancy in the chest, abdomen, or pelvis. 4. Moderate emphysema. These results will be called to the ordering clinician or representative by the Radiologist Assistant, and communication documented in the PACS or zVision Dashboard. Electronically Signed   By: Jeb Levering M.D.   On: 01/19/2017 02:24   Mr Brain W And Wo Contrast  Result Date: 01/18/2017 CLINICAL DATA:  Initial evaluation for intracranial mass. EXAM: MRI HEAD WITHOUT AND WITH CONTRAST TECHNIQUE: Multiplanar, multiecho pulse sequences of the brain and surrounding structures were obtained without and with intravenous contrast. CONTRAST:  74m MULTIHANCE GADOBENATE DIMEGLUMINE 529 MG/ML IV SOLN COMPARISON:  Prior CT from  earlier the same day. FINDINGS: Brain: Cerebral volume within normal limits. No significant cerebral white matter disease. Two large mass lesions are seen involving the bilateral cerebral hemispheres. Largest lesion is positioned within the right frontal lobe and measures 5.1 x 4.4 x 5.2 cm (series 12, image 24). Lesion is largely solid but demonstrates heterogeneous internal cystic components. Associated susceptibility artifact compatible with blood products. Heterogeneous post-contrast enhancement. Surrounding vasogenic edema throughout the right frontal region with associated 14 mm of right-to-left shift. Lateral ventricles are partially effaced without evidence for hydrocephalus or ventricular trapping at this time. Basilar cisterns R crowded but patent. Second lesion is positioned within the left occipital region and is predominantly cystic in nature with some internal soft tissue component posteriorly. Small amount of internal layering debris/hemorrhage as well. This lesion measures 5.7 x 3.4 x 5.0 cm (series 12, and image 24). Associated vasogenic edema within the left occipital region without significant mass effect. This lesion demonstrates peripheral rim enhancement. There is a third 5 mm focus of enhancement within the left  parafalcine region (series 12, image 37), suspicious for a third small lesion. Minimal associated edema noted within the adjacent cortical gray matter. No other definite lesions identified. Findings highly concerning for metastatic disease. No evidence for acute infarct. Gray-white matter differentiation otherwise maintained. No other areas of chronic infarction identified. No extra-axial fluid collection. Major dural sinuses are grossly patent. Pituitary gland grossly unremarkable. Vascular: Major intracranial vascular flow voids are maintained. ACA is are bowed to the left due to mass effect within the right frontal lobe. Skull and upper cervical spine: Craniocervical junction within  normal limits. No herniation through the foramen magnum. Visualized upper cervical spine unremarkable. Bone marrow signal intensity normal. No scalp soft tissue abnormality. Sinuses/Orbits: Globes and orbital soft tissues within normal limits. Paranasal sinuses are clear. No mastoid effusion. Inner ear structures normal. Other: No other significant finding. 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 Extensive vasogenic edema about the lesion within the right frontal lobe with associated 14 mm of right-to-left shift. Lateral ventricles are partially effaced without hydrocephalus or evidence for ventricular trapping at this time. Electronically Signed   By: Jeannine Boga M.D.   On: 01/18/2017 21:46   Ct Abdomen Pelvis W Contrast  Result Date: 01/19/2017 CLINICAL DATA:  Patient presented with brain lesions consistent with metastases. EXAM: CT CHEST, ABDOMEN, AND PELVIS WITH CONTRAST TECHNIQUE: Multidetector CT imaging of the chest, abdomen and pelvis was performed following the standard protocol during bolus administration of intravenous contrast. CONTRAST:  140m ISOVUE-300 IOPAMIDOL (ISOVUE-300) INJECTION 61% COMPARISON:  None. FINDINGS: CT CHEST FINDINGS Cardiovascular: Large enhancing left upper lobe mass encases the left main pulmonary artery with luminal narrowing. There is probable invasion into the left pulmonary vein and left atrium. The heart is normal in size. The thoracic aorta is normal in caliber. Mediastinum/Nodes: Large left upper lobe heterogeneous mass is contiguous with the left hilum. Separate adenopathy not demonstrated. No additional mediastinal adenopathy. No right hilar adenopathy. No axillary adenopathy. Lungs/Pleura: Large heterogeneous enhancing left upper lobe mass extends from the hilum anterior superiorly to the pleural surface. There is likely some degree of postobstructive atelectasis making exact measurements  difficult, however measures at least 6.6 x 6.4 x 8.7 cm. There is encasement of the left main pulmonary artery and likely invasion into the left pulmonary veins and left atrium. There is extension into the distal left mainstem bronchus at the bronchial bifurcation. No additional pulmonary nodule or mass. Moderate emphysema. No pleural fluid. Musculoskeletal: No blastic or evidence of destructive lytic lesions. There are no acute or suspicious osseous abnormalities. CT ABDOMEN PELVIS FINDINGS Hepatobiliary: No focal hepatic lesion. Gallbladder physiologically distended, no calcified stone. No biliary dilatation. Pancreas: No evidence of pancreatic mass. No ductal dilatation or inflammation. Spleen: Normal in size without focal abnormality. Adrenals/Urinary Tract: Adrenal glands are unremarkable. No adrenal nodule. Kidneys are normal, without renal calculi, focal lesion, or hydronephrosis. High-density material within the urinary bladder without bladder wall thickening. Stomach/Bowel: Lack of enteric contrast and paucity of body fat limits bowel assessment. Stomach is distended with ingested contents. No evidence of bowel wall thickening, distention or inflammation. No CT findings of focal mass. Vascular/Lymphatic: No retroperitoneal, pelvic, or mesenteric adenopathy. Mild aortic atherosclerosis without aneurysm. Reproductive: Uterus and bilateral adnexa are unremarkable. Other: There is subcutaneous nodule in the abdominal wall at the level of the umbilicus to the left of midline measuring 12 mm. No free air, free fluid, or intra-abdominal fluid collection. No omental or peritoneal thickening. Musculoskeletal: There  are no acute or suspicious osseous abnormalities. No blastic or evident destructive lytic lesions. IMPRESSION: 1. Large (at least 8 cm) enhancing left upper lobe lung mass encasing the left main pulmonary artery and invading the left pulmonary veins and left atrium. There is extension into the distal left  mainstem bronchus. 2. Nonspecific 12 mm subcutaneous nodule in the anterior abdominal wall, may be soft tissue metastasis or sebaceous cyst. 3. There is otherwise no evidence of primary or metastatic malignancy in the chest, abdomen, or pelvis. 4. Moderate emphysema. These results will be called to the ordering clinician or representative by the Radiologist Assistant, and communication documented in the PACS or zVision Dashboard. Electronically Signed   By: Jeb Levering M.D.   On: 01/19/2017 02:24   Dg Chest Port 1 View  Result Date: 01/21/2017 CLINICAL DATA:  Post bronchoscopy. EXAM: PORTABLE CHEST 1 VIEW COMPARISON:  Chest radiograph January 21, 2017 at 0937 hours FINDINGS: Large LEFT hilar/ upper lobe mass, no pneumothorax. Increased lung volumes with mild chronic interstitial changes most compatible with COPD. No pleural effusion. LEFT proximal humerus presumed bone infarct. IMPRESSION: LEFT hilar/ upper lobe mass, COPD.  No pneumothorax. Electronically Signed   By: Elon Alas M.D.   On: 01/21/2017 18:33        Discharge Exam: Vitals:   01/30/17 2114 01/31/17 0521  BP: (!) 154/80 134/88  Pulse: 65 62  Resp: 18 16  Temp: 98.2 F (36.8 C) 98.4 F (36.9 C)   Vitals:   01/30/17 0447 01/30/17 1526 01/30/17 2114 01/31/17 0521  BP: 131/80 140/72 (!) 154/80 134/88  Pulse: 69 60 65 62  Resp: '14 16 18 16  '$ Temp: 98.4 F (36.9 C) 98.4 F (36.9 C) 98.2 F (36.8 C) 98.4 F (36.9 C)  TempSrc: Oral Oral Oral Oral  SpO2: 95% 100% 100% 100%  Weight:      Height:        General: Pt is alert, awake, not in acute distress Cardiovascular: RRR, S1/S2 +, no rubs, no gallops Respiratory: CTA bilaterally, no wheezing, no rhonchi Abdominal: Soft, NT, ND, bowel sounds + Extremities: no edema, no cyanosis   The results of significant diagnostics from this hospitalization (including imaging, microbiology, ancillary and laboratory) are listed below for reference.    Significant Diagnostic  Studies: Dg Chest 2 View  Result Date: 01/21/2017 CLINICAL DATA:  Known lung mass.  Chest pain. EXAM: CHEST  2 VIEW COMPARISON:  CT scan January 19, 2017 FINDINGS: The known left upper lobe lung mass is identified, extending into the left hilum. No other nodules or masses. No evidence of pneumonia. No pneumothorax. The heart, right hilum, and mediastinum are normal. The left hilum is obscured by the known mass. IMPRESSION: Known left upper lobe lung mass, better characterized on CT imaging. No other abnormalities. Electronically Signed   By: Dorise Bullion III M.D   On: 01/21/2017 09:44   Ct Head Wo Contrast  Result Date: 01/18/2017 CLINICAL DATA:  46 y/o  F; headache and nausea for 1 month. EXAM: CT HEAD WITHOUT CONTRAST TECHNIQUE: Contiguous axial images were obtained from the base of the skull through the vertex without intravenous contrast. COMPARISON:  None. FINDINGS: Brain: Left parietal occipital cystic lesion measuring 26 x 49 x 45 mm (AP x ML x CC series 2, image 17) without significant surrounding edema. Mixed attenuation mass centered in the right frontal lobe measuring 48 x 42 x 44 mm (series 2, image 15 and series 4, image 24). There is extensive  surrounding vasogenic edema in the brain and local mass effect with right to left midline shift of 13 mm, right to left subfalcine herniation, right-sided uncal herniation, and partial effacement of the right lateral ventricle. Vascular: Mild calcific atherosclerosis of the cavernous and paraclinoid internal carotid arteries. Skull: Normal. Negative for fracture or focal lesion. Sinuses/Orbits: No acute finding. Other: None. IMPRESSION: 1. Mixed attenuation mass in right frontal lobe with extensive surrounding edema, 13 mm right to left midline shift, right to left subfalcine herniation, and right-sided uncal herniation. 2. Cystic lesion in the left parieto-occipital lobe of uncertain significance without surrounding edema. 3. MRI of the brain with and  without contrast is recommended for further evaluation. These results were called by telephone at the time of interpretation on 01/18/2017 at 7:32 pm to Dr. Bryson Ha [facility] , who verbally acknowledged these results. Electronically Signed   By: Kristine Garbe M.D.   On: 01/18/2017 19:34   Ct Chest W Contrast  Result Date: 01/19/2017 CLINICAL DATA:  Patient presented with brain lesions consistent with metastases. EXAM: CT CHEST, ABDOMEN, AND PELVIS WITH CONTRAST TECHNIQUE: Multidetector CT imaging of the chest, abdomen and pelvis was performed following the standard protocol during bolus administration of intravenous contrast. CONTRAST:  120m ISOVUE-300 IOPAMIDOL (ISOVUE-300) INJECTION 61% COMPARISON:  None. FINDINGS: CT CHEST FINDINGS Cardiovascular: Large enhancing left upper lobe mass encases the left main pulmonary artery with luminal narrowing. There is probable invasion into the left pulmonary vein and left atrium. The heart is normal in size. The thoracic aorta is normal in caliber. Mediastinum/Nodes: Large left upper lobe heterogeneous mass is contiguous with the left hilum. Separate adenopathy not demonstrated. No additional mediastinal adenopathy. No right hilar adenopathy. No axillary adenopathy. Lungs/Pleura: Large heterogeneous enhancing left upper lobe mass extends from the hilum anterior superiorly to the pleural surface. There is likely some degree of postobstructive atelectasis making exact measurements difficult, however measures at least 6.6 x 6.4 x 8.7 cm. There is encasement of the left main pulmonary artery and likely invasion into the left pulmonary veins and left atrium. There is extension into the distal left mainstem bronchus at the bronchial bifurcation. No additional pulmonary nodule or mass. Moderate emphysema. No pleural fluid. Musculoskeletal: No blastic or evidence of destructive lytic lesions. There are no acute or suspicious osseous abnormalities. CT ABDOMEN PELVIS FINDINGS  Hepatobiliary: No focal hepatic lesion. Gallbladder physiologically distended, no calcified stone. No biliary dilatation. Pancreas: No evidence of pancreatic mass. No ductal dilatation or inflammation. Spleen: Normal in size without focal abnormality. Adrenals/Urinary Tract: Adrenal glands are unremarkable. No adrenal nodule. Kidneys are normal, without renal calculi, focal lesion, or hydronephrosis. High-density material within the urinary bladder without bladder wall thickening. Stomach/Bowel: Lack of enteric contrast and paucity of body fat limits bowel assessment. Stomach is distended with ingested contents. No evidence of bowel wall thickening, distention or inflammation. No CT findings of focal mass. Vascular/Lymphatic: No retroperitoneal, pelvic, or mesenteric adenopathy. Mild aortic atherosclerosis without aneurysm. Reproductive: Uterus and bilateral adnexa are unremarkable. Other: There is subcutaneous nodule in the abdominal wall at the level of the umbilicus to the left of midline measuring 12 mm. No free air, free fluid, or intra-abdominal fluid collection. No omental or peritoneal thickening. Musculoskeletal: There are no acute or suspicious osseous abnormalities. No blastic or evident destructive lytic lesions. IMPRESSION: 1. Large (at least 8 cm) enhancing left upper lobe lung mass encasing the left main pulmonary artery and invading the left pulmonary veins and left atrium. There is extension into the  distal left mainstem bronchus. 2. Nonspecific 12 mm subcutaneous nodule in the anterior abdominal wall, may be soft tissue metastasis or sebaceous cyst. 3. There is otherwise no evidence of primary or metastatic malignancy in the chest, abdomen, or pelvis. 4. Moderate emphysema. These results will be called to the ordering clinician or representative by the Radiologist Assistant, and communication documented in the PACS or zVision Dashboard. Electronically Signed   By: Jeb Levering M.D.   On:  01/19/2017 02:24   Mr Brain W And Wo Contrast  Result Date: 01/18/2017 CLINICAL DATA:  Initial evaluation for intracranial mass. EXAM: MRI HEAD WITHOUT AND WITH CONTRAST TECHNIQUE: Multiplanar, multiecho pulse sequences of the brain and surrounding structures were obtained without and with intravenous contrast. CONTRAST:  71m MULTIHANCE GADOBENATE DIMEGLUMINE 529 MG/ML IV SOLN COMPARISON:  Prior CT from earlier the same day. FINDINGS: Brain: Cerebral volume within normal limits. No significant cerebral white matter disease. Two large mass lesions are seen involving the bilateral cerebral hemispheres. Largest lesion is positioned within the right frontal lobe and measures 5.1 x 4.4 x 5.2 cm (series 12, image 24). Lesion is largely solid but demonstrates heterogeneous internal cystic components. Associated susceptibility artifact compatible with blood products. Heterogeneous post-contrast enhancement. Surrounding vasogenic edema throughout the right frontal region with associated 14 mm of right-to-left shift. Lateral ventricles are partially effaced without evidence for hydrocephalus or ventricular trapping at this time. Basilar cisterns R crowded but patent. Second lesion is positioned within the left occipital region and is predominantly cystic in nature with some internal soft tissue component posteriorly. Small amount of internal layering debris/hemorrhage as well. This lesion measures 5.7 x 3.4 x 5.0 cm (series 12, and image 24). Associated vasogenic edema within the left occipital region without significant mass effect. This lesion demonstrates peripheral rim enhancement. There is a third 5 mm focus of enhancement within the left parafalcine region (series 12, image 37), suspicious for a third small lesion. Minimal associated edema noted within the adjacent cortical gray matter. No other definite lesions identified. Findings highly concerning for metastatic disease. No evidence for acute infarct. Gray-white  matter differentiation otherwise maintained. No other areas of chronic infarction identified. No extra-axial fluid collection. Major dural sinuses are grossly patent. Pituitary gland grossly unremarkable. Vascular: Major intracranial vascular flow voids are maintained. ACA is are bowed to the left due to mass effect within the right frontal lobe. Skull and upper cervical spine: Craniocervical junction within normal limits. No herniation through the foramen magnum. Visualized upper cervical spine unremarkable. Bone marrow signal intensity normal. No scalp soft tissue abnormality. Sinuses/Orbits: Globes and orbital soft tissues within normal limits. Paranasal sinuses are clear. No mastoid effusion. Inner ear structures normal. Other: No other significant finding. 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 Extensive vasogenic edema about the lesion within the right frontal lobe with associated 14 mm of right-to-left shift. Lateral ventricles are partially effaced without hydrocephalus or evidence for ventricular trapping at this time. Electronically Signed   By: BJeannine BogaM.D.   On: 01/18/2017 21:46   Ct Abdomen Pelvis W Contrast  Result Date: 01/19/2017 CLINICAL DATA:  Patient presented with brain lesions consistent with metastases. EXAM: CT CHEST, ABDOMEN, AND PELVIS WITH CONTRAST TECHNIQUE: Multidetector CT imaging of the chest, abdomen and pelvis was performed following the standard protocol during bolus administration of intravenous contrast. CONTRAST:  1030mISOVUE-300 IOPAMIDOL (ISOVUE-300) INJECTION 61% COMPARISON:  None. FINDINGS: CT CHEST FINDINGS Cardiovascular: Large enhancing left upper lobe mass  encases the left main pulmonary artery with luminal narrowing. There is probable invasion into the left pulmonary vein and left atrium. The heart is normal in size. The thoracic aorta is normal in caliber. Mediastinum/Nodes: Large left upper  lobe heterogeneous mass is contiguous with the left hilum. Separate adenopathy not demonstrated. No additional mediastinal adenopathy. No right hilar adenopathy. No axillary adenopathy. Lungs/Pleura: Large heterogeneous enhancing left upper lobe mass extends from the hilum anterior superiorly to the pleural surface. There is likely some degree of postobstructive atelectasis making exact measurements difficult, however measures at least 6.6 x 6.4 x 8.7 cm. There is encasement of the left main pulmonary artery and likely invasion into the left pulmonary veins and left atrium. There is extension into the distal left mainstem bronchus at the bronchial bifurcation. No additional pulmonary nodule or mass. Moderate emphysema. No pleural fluid. Musculoskeletal: No blastic or evidence of destructive lytic lesions. There are no acute or suspicious osseous abnormalities. CT ABDOMEN PELVIS FINDINGS Hepatobiliary: No focal hepatic lesion. Gallbladder physiologically distended, no calcified stone. No biliary dilatation. Pancreas: No evidence of pancreatic mass. No ductal dilatation or inflammation. Spleen: Normal in size without focal abnormality. Adrenals/Urinary Tract: Adrenal glands are unremarkable. No adrenal nodule. Kidneys are normal, without renal calculi, focal lesion, or hydronephrosis. High-density material within the urinary bladder without bladder wall thickening. Stomach/Bowel: Lack of enteric contrast and paucity of body fat limits bowel assessment. Stomach is distended with ingested contents. No evidence of bowel wall thickening, distention or inflammation. No CT findings of focal mass. Vascular/Lymphatic: No retroperitoneal, pelvic, or mesenteric adenopathy. Mild aortic atherosclerosis without aneurysm. Reproductive: Uterus and bilateral adnexa are unremarkable. Other: There is subcutaneous nodule in the abdominal wall at the level of the umbilicus to the left of midline measuring 12 mm. No free air, free fluid,  or intra-abdominal fluid collection. No omental or peritoneal thickening. Musculoskeletal: There are no acute or suspicious osseous abnormalities. No blastic or evident destructive lytic lesions. IMPRESSION: 1. Large (at least 8 cm) enhancing left upper lobe lung mass encasing the left main pulmonary artery and invading the left pulmonary veins and left atrium. There is extension into the distal left mainstem bronchus. 2. Nonspecific 12 mm subcutaneous nodule in the anterior abdominal wall, may be soft tissue metastasis or sebaceous cyst. 3. There is otherwise no evidence of primary or metastatic malignancy in the chest, abdomen, or pelvis. 4. Moderate emphysema. These results will be called to the ordering clinician or representative by the Radiologist Assistant, and communication documented in the PACS or zVision Dashboard. Electronically Signed   By: Jeb Levering M.D.   On: 01/19/2017 02:24   Dg Chest Port 1 View  Result Date: 01/21/2017 CLINICAL DATA:  Post bronchoscopy. EXAM: PORTABLE CHEST 1 VIEW COMPARISON:  Chest radiograph January 21, 2017 at 0937 hours FINDINGS: Large LEFT hilar/ upper lobe mass, no pneumothorax. Increased lung volumes with mild chronic interstitial changes most compatible with COPD. No pleural effusion. LEFT proximal humerus presumed bone infarct. IMPRESSION: LEFT hilar/ upper lobe mass, COPD.  No pneumothorax. Electronically Signed   By: Elon Alas M.D.   On: 01/21/2017 18:33     Microbiology: No results found for this or any previous visit (from the past 240 hour(s)).   Labs: Basic Metabolic Panel:  Recent Labs Lab 01/24/17 1048  01/24/17 1412 01/25/17 0419 01/27/17 1110 01/30/17 1238 01/31/17 0417  NA  --   --  137 138 136 132*  --   K  --   < >  3.9 4.0 4.0 3.4*  --   CL  --   --  101 101 103 97*  --   CO2  --   --  '29 29 27 27  '$ --   GLUCOSE  --   --  110* 122* 141* 159*  --   BUN  --   --  29* 28* 18 21*  --   CREATININE  --   --  0.53 0.52 0.52 0.50  0.49  CALCIUM  --   --  9.1 9.3 8.7* 8.6*  --   MG 1.9  --   --  2.1  --  1.9  --   < > = values in this interval not displayed. Liver Function Tests: No results for input(s): AST, ALT, ALKPHOS, BILITOT, PROT, ALBUMIN in the last 168 hours. No results for input(s): LIPASE, AMYLASE in the last 168 hours. No results for input(s): AMMONIA in the last 168 hours. CBC:  Recent Labs Lab 01/24/17 1412 01/25/17 0419  WBC 10.1 11.8*  HGB 14.2 14.2  HCT 42.2 42.3  MCV 93.2 93.2  PLT 400 398   Cardiac Enzymes: No results for input(s): CKTOTAL, CKMB, CKMBINDEX, TROPONINI in the last 168 hours. BNP: Invalid input(s): POCBNP CBG: No results for input(s): GLUCAP in the last 168 hours.  Time coordinating discharge:  Greater than 30 minutes  Signed:  Latoi Giraldo, DO [facility] Pager: [phone] 01/31/2017, 11:15 AM

## 2017-01-31 NOTE — Progress Notes (Signed)
Pt completed her radiation brain  c/o severe head ache will need pain medication when back on the floor, also will print scheduled fro radiation to give to [place]  For transportation for her radiation treatments, Andre Lefort RN? 10:16 AM

## 2017-02-01 ENCOUNTER — Telehealth: Payer: Self-pay | Admitting: Hematology

## 2017-02-01 ENCOUNTER — Encounter: Payer: Self-pay | Admitting: Adult Health

## 2017-02-01 ENCOUNTER — Non-Acute Institutional Stay (SKILLED_NURSING_FACILITY): Payer: Medicaid Other | Admitting: Internal Medicine

## 2017-02-01 ENCOUNTER — Telehealth: Payer: Self-pay | Admitting: *Deleted

## 2017-02-01 ENCOUNTER — Ambulatory Visit: Payer: Medicaid Other

## 2017-02-01 ENCOUNTER — Encounter: Payer: Self-pay | Admitting: Internal Medicine

## 2017-02-01 DIAGNOSIS — I951 Orthostatic hypotension: Secondary | ICD-10-CM | POA: Diagnosis not present

## 2017-02-01 DIAGNOSIS — Z720 Tobacco use: Secondary | ICD-10-CM | POA: Diagnosis not present

## 2017-02-01 DIAGNOSIS — C7931 Secondary malignant neoplasm of brain: Secondary | ICD-10-CM | POA: Diagnosis not present

## 2017-02-01 DIAGNOSIS — F329 Major depressive disorder, single episode, unspecified: Secondary | ICD-10-CM | POA: Diagnosis not present

## 2017-02-01 DIAGNOSIS — J438 Other emphysema: Secondary | ICD-10-CM

## 2017-02-01 DIAGNOSIS — R51 Headache: Secondary | ICD-10-CM

## 2017-02-01 DIAGNOSIS — R519 Headache, unspecified: Secondary | ICD-10-CM

## 2017-02-01 DIAGNOSIS — C3492 Malignant neoplasm of unspecified part of left bronchus or lung: Secondary | ICD-10-CM | POA: Diagnosis not present

## 2017-02-01 DIAGNOSIS — F32A Depression, unspecified: Secondary | ICD-10-CM

## 2017-02-01 DIAGNOSIS — G8929 Other chronic pain: Secondary | ICD-10-CM

## 2017-02-01 NOTE — Progress Notes (Signed)
Entered in error

## 2017-02-01 NOTE — Telephone Encounter (Signed)
Lft a vm with the appt date and time. Pt is to see Dr. Burr Medico on 3/20 at 830am.

## 2017-02-01 NOTE — Telephone Encounter (Signed)
Called [facility] spoke with Raquel, patient refusing to come for radiation today, family at bedside, per Raquel she has called numerous times this morning at [phone] no one answering, gave Raquel our direct number at nursing [phone], patient refusing to come to the phone and patient's mother also refusing to come , Raquel will let Velna Hatchet ,daughter know and if patient agrees to come or not she will call us Monday ,thanked Raquel will let MD and RT Therapists know 9:32 AM

## 2017-02-01 NOTE — Progress Notes (Signed)
Patient ID: Erika Hunter, female   DOB: Jun 10, 1971, 46 y.o.   MRN: [id number]   Provider:  Rexene Edison. Mariea Clonts, D.O., C.M.D. Location:  [facility] Room Number: 120 A Place of Service:  SNF (31)  PCP: Reginia Forts, MD Patient Care Team: Wardell Honour, MD as PCP - General (Family Medicine) Ashok Pall, MD as Consulting Physician (Neurosurgery)  Extended Emergency Contact Information Primary Emergency Contact: Tsao,Brandi Address: [address] [place] [address] Johnnette Litter of [place] Phone: [phone] Mobile Phone: [phone] Relation: Daughter Secondary Emergency Contact: Magda Paganini, [place] [address] Johnnette Litter of [place] Phone: [phone] Mobile Phone: [phone] Relation: Friend  Code Status: full code Goals of Care: Advanced Directive information Advanced Directives 02/01/2017  Does Patient Have a Medical Advance Directive? No  Would patient like information on creating a medical advance directive? No - Patient declined  MOST filled in for aggressive therapy, but patient refused chemo this am  Chief Complaint  Patient presents with  . New Admit To SNF    HPI: Patient is a 46 y.o. female seen today for admission to SNF for PT, OT, and ADL assistance while she is undergoing XRT for her brain mets.  She has a h/o COPD, tobacco abuse, alcohol abuse.  She was hospitalized from 3/2-3/15 with confusion, memory loss, headaches, and a 40-60 lb weight loss over 4-6 mos.  She had a CT of the brain showing brain metastases for which she was started on decadron therapy.  Further imaging showed LUL mass.  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  After this, she was determined not to be a craniotomy candidate.  She has started getting XRT to shrink the mets and hopefully improve her symptoms.  She was noted to have shaking episodes and sudden falls.  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  Her po intake improved after decadron therapy.  Palliative care met with her and her mother before she left the hospital and she wanted to pursue ongoing aggressive therapy.  MOST was filled in for full code, possible advanced life support, abx, ivfs, tube feeding if needed.  Pt said she did not feel ready to die now when we met but also did not think she would want a feeding tube.  She did refuse to go to radiation therapy this am b/c she did not feel like it was helping though the mets were said to be shrinking.  Her symptoms have not improved outside of her appetite.  She is sleeping well.  For her COPD, she's on dulera, a nicoderm patch and prn nebs.  She's taking effexor and xanax for her anxiety and depression.    Past Medical History:  Diagnosis Date  . Alcohol dependence ([place]) 01/24/2017  . Anxiety    panic disorder  . Asthma   . Cervical dysplasia 11/19/1988   S/P conization  . COPD (chronic obstructive pulmonary disease) ([place]) 01/24/2017  . Depression   . Hyperlipidemia   . Steatohepatitis    h/o heavy alcohol use, hepatitis profile negative for A, B   Past Surgical History:  Procedure Laterality Date  . Cold knife conization  Early 2000s   Pre-cancerous lesions  . DILATION AND CURETTAGE OF UTERUS    .  LUNG BIOPSY Left 01/21/2017   Procedure: LUNG BIOPSY LEFT UPPER LOBE;  Surgeon: Grace Isaac, MD;  Location: [facility];  Service: Thoracic;  Laterality: Left;  Marland Kitchen VIDEO BRONCHOSCOPY N/A 01/21/2017   Procedure: VIDEO BRONCHOSCOPY;  Surgeon: Grace Isaac, MD;  Location: [facility] OR;  Service: Thoracic;  Laterality: N/A;    Social History   Social History  . Marital status: Single    Spouse name: n/a  . Number of children: 2  . Years of education: High School   Occupational History  . Holiday representative   Social History Main Topics  . Smoking status:  Current Every Day Smoker    Packs/day: 1.00    Years: 25.00  . Smokeless tobacco: Never Used  . Alcohol use Yes     Comment: wine - few glasses/day  . Drug use: No  . Sexual activity: Yes   Other Topics Concern  . None   Social History Narrative   Marital status: divorced; dating, moved to [place] from [place] to be with her boyfriend..      Children; 2 children; no grandchildren      Lives: with a co-worker      Employment: clerk at gas station x 5.5 years      Tobacco; 3/4 ppd since 46 yo      Alcohol:  3 glasses wine per day.      Drugs: none      Exercise:  none    reports that she has been smoking.  She has a 25.00 pack-year smoking history. She has never used smokeless tobacco. She reports that she drinks alcohol. She reports that she does not use drugs.  Functional Status Survey:    Family History  Problem Relation Age of Onset  . COPD Mother   . COPD Sister   . Cancer Sister 30    ovarian  . Diabetes Sister   . Mental illness Sister     Health Maintenance  Topic Date Due  . PAP SMEAR  06/21/2018  . TETANUS/TDAP  01/22/2021  . INFLUENZA VACCINE  Completed  . HIV Screening  Completed    No Known Allergies  Allergies as of 02/01/2017   No Known Allergies     Medication List       Accurate as of 02/01/17  2:20 PM. Always use your most recent med list.          acetaminophen 325 MG tablet Commonly known as:  TYLENOL Take 2 tablets (650 mg total) by mouth every 6 (six) hours as needed for mild pain (or Fever >/= 101).   albuterol 108 (90 Base) MCG/ACT inhaler Commonly known as:  PROAIR HFA INHALE 2 PUFFS INTO LUNGS EVERY 4 HOURS AS NEEDED FOR COUGH, SHORTNESS OF BREATH, OR WHEEZING   ARTIFICIAL TEARS 1.4 % ophthalmic solution Generic drug:  polyvinyl alcohol Place 1 drop into both eyes every 6 (six) hours as needed for dry eyes.   dexamethasone 6 MG tablet Commonly known as:  DECADRON Take 1 tablet (6 mg total) by mouth every 6 (six)  hours.   emollient cream Commonly known as:  BIAFINE Apply 1 application topically daily. zpply  Cream to head and left chest after radiation daily once skin becomes reddened , irritated or itchy, do not apply biafine 4 hours prior to radiation treatments   fludrocortisone 0.1 MG tablet Commonly known as:  FLORINEF Take 3 tablets (0.3 mg total) by mouth daily.   levETIRAcetam  500 MG tablet Commonly known as:  KEPPRA Take 1 tablet (500 mg total) by mouth 2 (two) times daily.   midodrine 10 MG tablet Commonly known as:  PROAMATINE Take 1 tablet (10 mg total) by mouth 3 (three) times daily with meals.   MULTI-VITAMIN GUMMIES PO Take 2 each by mouth daily.   nicotine 21 mg/24hr patch Commonly known as:  NICODERM CQ - dosed in mg/24 hours Place 1 patch (21 mg total) onto the skin daily.   oxyCODONE 5 MG immediate release tablet Commonly known as:  Oxy IR/ROXICODONE Take 1 tablet (5 mg total) by mouth every 4 (four) hours as needed for moderate pain.   venlafaxine XR 75 MG 24 hr capsule Commonly known as:  EFFEXOR-XR TAKE 1 CAPSULE BY MOUTH ONCE DAILY ALONG WITH '150MG'$  CAPSULE   venlafaxine XR 150 MG 24 hr capsule Commonly known as:  EFFEXOR-XR TAKE 1 CAPSULE BY MOUTH ONCE DAILY ALONG WITH '75MG'$  CAPSULE       Review of Systems  Constitutional: Negative for chills and fever.  HENT: Negative for congestion and hearing loss.   Eyes: Negative for blurred vision.  Respiratory: Negative for cough, sputum production and shortness of breath.   Cardiovascular: Negative for chest pain, palpitations and leg swelling.  Gastrointestinal: Negative for abdominal pain, blood in stool, constipation and melena.  Genitourinary: Negative for dysuria.  Musculoskeletal: Positive for falls.  Skin: Negative for itching and rash.  Neurological: Positive for dizziness and headaches. Negative for tingling, tremors, sensory change, speech change, focal weakness, seizures and loss of consciousness.    Psychiatric/Behavioral: Positive for memory loss. Negative for depression. The patient is nervous/anxious. The patient does not have insomnia.     Vitals:   02/01/17 1410  BP: 136/62  Pulse: 76  Resp: 18  Temp: (!) 96.8 F (36 C)  TempSrc: Oral  SpO2: 96%  Weight: 119 lb (54 kg)  Height: '5\' 5"'$  (1.651 m)   Body mass index is 19.8 kg/m. Physical Exam  Constitutional: No distress.  Thin female resting in bed  HENT:  Head: Normocephalic and atraumatic.  Right Ear: External ear normal.  Left Ear: External ear normal.  Nose: Nose normal.  Mouth/Throat: Oropharynx is clear and moist.  Eyes: Conjunctivae and EOM are normal. Pupils are equal, round, and reactive to light.  Neck: Neck supple. No JVD present.  Cardiovascular: Normal rate, regular rhythm, normal heart sounds and intact distal pulses.   Pulmonary/Chest: Effort normal and breath sounds normal.  Abdominal: Soft. Bowel sounds are normal. She exhibits no distension. There is no tenderness.  Musculoskeletal: Normal range of motion.  Lymphadenopathy:    She has no cervical adenopathy.  Neurological: She is alert.  Had eyes opened and was talking away to me, but did not always make full sense  Skin: Skin is warm and dry.  Psychiatric:  Some confusion, did not seem consistent with her wishes off and on during visi    Labs reviewed: Basic Metabolic Panel:  Recent Labs  01/24/17 1048  01/25/17 0419 01/27/17 1110 01/30/17 1238 01/31/17 0417  NA  --   < > 138 136 132*  --   K  --   < > 4.0 4.0 3.4*  --   CL  --   < > 101 103 97*  --   CO2  --   < > '29 27 27  '$ --   GLUCOSE  --   < > 122* 141* 159*  --   BUN  --   < >  28* 18 21*  --   CREATININE  --   < > 0.52 0.52 0.50 0.49  CALCIUM  --   < > 9.3 8.7* 8.6*  --   MG 1.9  --  2.1  --  1.9  --   < > = values in this interval not displayed. Liver Function Tests:  Recent Labs  01/18/17 1951  AST 18  ALT 15  ALKPHOS 65  BILITOT 0.4  PROT 7.2  ALBUMIN 3.4*    No results for input(s): LIPASE, AMYLASE in the last 8760 hours. No results for input(s): AMMONIA in the last 8760 hours. CBC:  Recent Labs  01/18/17 1951  01/24/17 0852 01/24/17 1412 01/25/17 0419  WBC 10.7*  < > 10.9* 10.1 11.8*  NEUTROABS 8.1*  --  8.2*  --   --   HGB 13.6  < > 13.9 14.2 14.2  HCT 41.3  < > 41.4 42.2 42.3  MCV 96.5  < > 94.5 93.2 93.2  PLT 459*  < > 370 400 398  < > = values in this interval not displayed. Cardiac Enzymes: No results for input(s): CKTOTAL, CKMB, CKMBINDEX, TROPONINI in the last 8760 hours. BNP: Invalid input(s): POCBNP No results found for: HGBA1C Lab Results  Component Value Date   TSH 0.751 01/18/2017  Imaging and Procedures obtained prior to SNF admission: Imaging from hospitalization reviewed  Assessment/Plan 1. Small cell lung cancer, left [facility]) -to see Dr. Burr Medico next Tuesday 3/20 to discuss chemo txs--I'm not sure she is going to want this based on her perspective today about wanting to stop her radiation due to lack of symptom benefit, but we will see---she was not very clear-headed either during the discussion  2. Brain metastasis ([place]) -skipped her XRT today and not sure she wants to continue despite known shrinkage of tumors b/c she does not feel better--still having headaches off and on and severe dizziness with #3  3. Orthostatic hypotension -severe, needs assistance to get up at all due to fall risk  4. Other emphysema ([place]) -cont on dulera, nicoderm, prn nebs  5. Chronic nonintractable headache, unspecified headache type -cont decadron which has improved appetite and decreased frequency of headaches it seems  6. Depression, unspecified depression type -cont effexor and xanax  7. Tobacco user -cont nicoderm patch  Pt will be difficult to manage with chemo with significant fatty liver from years of alcohol abuse and malnutrition increasing renal failure risk.  Family/ staff Communication: friend was  present  Labs/tests ordered:  No new  Adel Burch L. Leora Platt, D.O. [facility] [address], [place] [address] Cell Phone (Mon-Fri 8am-5pm):  [phone] On Call:  [phone] & follow prompts after 5pm & weekends Office Phone:  [phone] Office Fax:  [phone]

## 2017-02-04 ENCOUNTER — Ambulatory Visit
Admission: RE | Admit: 2017-02-04 | Discharge: 2017-02-04 | Disposition: A | Discharge status: Home / Self Care | Payer: Medicaid Other | Source: Ambulatory Visit | Attending: Radiation Oncology | Admitting: Radiation Oncology

## 2017-02-04 ENCOUNTER — Telehealth: Payer: Self-pay | Admitting: *Deleted

## 2017-02-04 DIAGNOSIS — C7931 Secondary malignant neoplasm of brain: Secondary | ICD-10-CM | POA: Diagnosis present

## 2017-02-04 DIAGNOSIS — Z510 Encounter for antineoplastic radiation therapy: Secondary | ICD-10-CM | POA: Diagnosis not present

## 2017-02-04 NOTE — Telephone Encounter (Signed)
Returned call to friend Arville Go who is at [employer], "I was told the ambulance  Was on their way,, I asked her if she would call when the ambulance gets there and call us when they are on their way, her appt was for 9:15am today ad rest o the week,completes this Thursday", Joann stated sh would make sure patient is her on time even if she needs to bring her here herself", printed schedule again to provide, called Lianc#3, informed Melanie and [facility] therapists  9:36 AM

## 2017-02-05 ENCOUNTER — Ambulatory Visit
Admission: RE | Admit: 2017-02-05 | Discharge: 2017-02-05 | Disposition: A | Discharge status: Home / Self Care | Payer: Medicaid Other | Source: Ambulatory Visit | Attending: Radiation Oncology | Admitting: Radiation Oncology

## 2017-02-05 ENCOUNTER — Telehealth: Payer: Self-pay | Admitting: *Deleted

## 2017-02-05 ENCOUNTER — Encounter: Payer: Self-pay | Admitting: Hematology

## 2017-02-05 DIAGNOSIS — Z510 Encounter for antineoplastic radiation therapy: Secondary | ICD-10-CM | POA: Diagnosis not present

## 2017-02-05 NOTE — Progress Notes (Signed)
This encounter was created in error - please disregard.

## 2017-02-05 NOTE — Telephone Encounter (Signed)
Called [facility], [phone];  patient missed 830 am appt with Dr. Burr Medico and our radiation appt at 70 am, gave Arville Go, friend yesterday to show patient's  schedule of both, per friend yesterday, she told staff not to ask if patient  was ready to go for treatment, just to say its time to go for your radiation treatment", patient came and had radiation treatment and seemed better, talkative, good eye contact,spoke with Nurse Amalia Hailey, she was aware of the schedule times, and knew the patient was to be here at 39 at the cancer center, she notified her supervisor and nothing was done as yet, asked her to go and speak with supervisor or the  DON, and that patient needs to have her radiation not interrupted, and to call me and let us know when they can get her here , we will fit her in,patient will need to be rescheduled to see Dr. Burr Medico, , per Nurse Amalia Hailey, she stated the unit manager didn't get a schedule till this am, they will send the patient soon 9:50 AM

## 2017-02-06 ENCOUNTER — Ambulatory Visit: Payer: Medicaid Other

## 2017-02-06 ENCOUNTER — Telehealth: Payer: Self-pay | Admitting: Hematology

## 2017-02-06 ENCOUNTER — Ambulatory Visit
Admission: RE | Admit: 2017-02-06 | Discharge: 2017-02-06 | Disposition: A | Discharge status: Home / Self Care | Payer: Medicaid Other | Source: Ambulatory Visit | Attending: Radiation Oncology | Admitting: Radiation Oncology

## 2017-02-06 DIAGNOSIS — Z510 Encounter for antineoplastic radiation therapy: Secondary | ICD-10-CM | POA: Diagnosis not present

## 2017-02-06 NOTE — Telephone Encounter (Signed)
Called [facility] to inform facility of pt r/s appt with Dr Burr Medico on 3/27 at 2pm per LOS

## 2017-02-07 ENCOUNTER — Telehealth: Payer: Self-pay | Admitting: *Deleted

## 2017-02-07 ENCOUNTER — Ambulatory Visit
Admission: RE | Admit: 2017-02-07 | Discharge: 2017-02-07 | Disposition: A | Discharge status: Home / Self Care | Payer: Medicaid Other | Source: Ambulatory Visit | Attending: Radiation Oncology | Admitting: Radiation Oncology

## 2017-02-07 ENCOUNTER — Encounter: Payer: Self-pay | Admitting: *Deleted

## 2017-02-07 ENCOUNTER — Encounter: Payer: Self-pay | Admitting: Radiation Oncology

## 2017-02-07 DIAGNOSIS — Z510 Encounter for antineoplastic radiation therapy: Secondary | ICD-10-CM | POA: Diagnosis not present

## 2017-02-07 NOTE — Telephone Encounter (Signed)
Called [facility] facility spoke with nurse Theadora Rama, asked to fax [place] med list and paper work normally sent with the patient  Per Nurse patient is still taking dexamethasone '6mg'$  every 6 hours, she fax [place] paperwork 12:04 PM .notified MD

## 2017-02-07 NOTE — Progress Notes (Signed)
[place] Work  Clinical Social Work was referred by need for CSW follow up and for assessment of psychosocial needs.  Clinical Social Worker met with patient at [facility] after her last radiation treatment to offer support and assess for needs.  CSW reviewed role of CSW/Pt and Family Support team, resources to assist and additional supports available. Pt is continuing to reside at [facility] for rehab and care. She plans to stay there into April until she completes rehab, around April 15 per chart review. Pt shared her extended family is all out of state and feels her daughter, Velna Hatchet was named her HCPOA. CSW inquired if she had completed her ADRs/HCPOA and pt felt she had. CSW reviewed chart and could not locate a copy of ADRs/HCPOA in her chart. Pt continues to be transported by [facility] for her appointments and feels friends could bring her after she returns home.   Pt reports she plans to start chemotherapy next week. Pt appeared to get confused at times and shared her "memory was not good". She shared she plans to return to her best friend's home where she was residing prior to her diagnosis. Pt would be home alone during the day, as her friend works. This may not be a safe option based on her medical condition. CSW inquired about her emotional coping and support system. Pt reports she has a strong network of local friends, as she does not have local family. She reports her mother is visiting from [place], but no mention of her in contact information. Pt appeared to be aware that she would not be returning to work or driving again, but may not fully grasp the severity of her illness as she plans to "get better soon". CSW plans to reach out to SNF SW and daughter to follow up on plans for after SNF, ADRs, etc.  CSW team to follow.   Clinical Social Work interventions:  Supportive counseling/needs assessment.  Resource education  Loren Racer, [place], [place] Clinical Social Worker [place]  [place] Phone: [phone] Fax: [phone]

## 2017-02-07 NOTE — Telephone Encounter (Signed)
called [facility] re[facility] facility, patient compled tx, gave  Orders, written rx for decadron and f/u appt placed in envelope for the patient, spoke with nurse Seth Bake requesting if they can get their MD to write for patient to have ambien at night per patient request,she will try and get this for the patient but the MD is out of the facility today, she stated she would work on it, Thanked her for her promptness getting the paper work faxed so soon, 12:36 PM

## 2017-02-08 NOTE — Progress Notes (Signed)
  Radiation Oncology         [phone] ________________________________  Name: Erika Hunter MRN: [id number]  Date: 01/23/2017  DOB: 03-Aug-1971  SIMULATION AND TREATMENT PLANNING NOTE  DIAGNOSIS:     ICD-9-CM ICD-10-CM   1. Brain metastasis ([place]) 198.3 C79.31      Site:   1.  Whole brain radiation treatment 2.  Left lung  NARRATIVE:  The patient was brought to the [facility].  Identity was confirmed.  All relevant records and images related to the planned course of therapy were reviewed.   Written consent to proceed with treatment was confirmed which was freely given after reviewing the details related to the planned course of therapy had been reviewed with the patient.  Then, the patient was set-up in a stable reproducible  supine position for radiation therapy.  CT images were obtained.  Surface markings were placed.    Medically necessary complex treatment device(s) for immobilization:  Customized thermoplastic head cast.   The CT images were loaded into the planning software.  Then the target and avoidance structures were contoured.  Treatment planning then occurred.  The radiation prescription was entered and confirmed.  A total of 5 complex treatment devices were fabricated which relate to the designed radiation treatment fields. Additionally, a reduced field technique will be utilized for treatment to the brain to improve dose homogeneity. Each of these customized fields/ complex treatment devices will be used on a daily basis during the radiation course. I have requested : 3D Simulation  I have requested a DVH of the following structures: Gross tumor volume within the chest, lungs, spinal cord.   The patient will undergo daily image guidance to ensure accurate localization of the target, and adequate minimize dose to the normal surrounding structures in close proximity to the target.   PLAN:  The patient will receive 30 Gy in 10  fractions.  ________________________________   Jodelle Gross, MD, PhD

## 2017-02-12 ENCOUNTER — Ambulatory Visit (HOSPITAL_BASED_OUTPATIENT_CLINIC_OR_DEPARTMENT_OTHER): Payer: Medicaid Other | Admitting: Hematology

## 2017-02-12 ENCOUNTER — Encounter: Payer: Self-pay | Admitting: Hematology

## 2017-02-12 VITALS — BP 150/93 | HR 75 | Temp 98.4°F | Ht 65.0 in | Wt 113.9 lb

## 2017-02-12 DIAGNOSIS — C3412 Malignant neoplasm of upper lobe, left bronchus or lung: Secondary | ICD-10-CM | POA: Diagnosis not present

## 2017-02-12 DIAGNOSIS — C7931 Secondary malignant neoplasm of brain: Secondary | ICD-10-CM | POA: Diagnosis not present

## 2017-02-12 DIAGNOSIS — C3492 Malignant neoplasm of unspecified part of left bronchus or lung: Secondary | ICD-10-CM

## 2017-02-12 MED ORDER — ZOLPIDEM TARTRATE 5 MG PO TABS: 5.0000 mg | ORAL_TABLET | Freq: Every evening | ORAL | 1 refills | Status: DC | PRN

## 2017-02-12 NOTE — Progress Notes (Signed)
[place]  [phone] [phone]  Clinic Follow up Note   Patient Care Team: Wardell Honour, MD as PCP - General (Family Medicine) Ashok Pall, MD as Consulting Physician (Neurosurgery) 02/12/2017  SUMMARY OF ONCOLOGIC HISTORY: Oncology History   Cancer Staging Small cell lung cancer, left [facility]) Staging form: Lung, AJCC 8th Edition - Clinical stage from 01/21/2017: Stage IVA (cT4, cNX, pM1a) - Signed by Truitt Merle, MD on 02/12/2017       Small cell lung cancer, left ([place])   01/18/2017 Imaging     MR brain w wo contrast showed: Three total mass lesions as above, highly concerning for intracranial metastatic disease. Extensive vasogenic edema about the lesion within the right frontal lobe with associated 14 mm of right-to-left shift. Lateral ventricles are partially effaced without hydrocephalus or evidence for ventricular trapping at this time.      01/19/2017 Imaging    CT chest, abdomen and pelvis with contrast  IMPRESSION: 1. Large (at least 8 cm) enhancing left upper lobe lung mass encasing the left main pulmonary artery and invading the left pulmonary veins and left atrium. There is extension into the distal left mainstem bronchus. 2. Nonspecific 12 mm subcutaneous nodule in the anterior abdominal wall, may be soft tissue metastasis or sebaceous cyst. 3. There is otherwise no evidence of primary or metastatic malignancy in the chest, abdomen, or pelvis. 4. Moderate emphysema.      01/21/2017 Imaging    Diagnosis 1. Lung, biopsy, Left Upper Lobe - NECROTIC MATERIAL. 2. Lung, biopsy, Left Upper Lobe #2 - SMALL CELL CARCINOMA. - SEE COMMENT. 3. Lung, biopsy, Left Upper Lobe #3 - SMALL CELL CARCINOMA. - SEE COMMENT. 4. Lung, biopsy, Left Upper Lobe #4 - SMALL CELL CARCINOMA. - SEE COMMENT.       01/22/2017 Initial Diagnosis    Small cell lung cancer, left ([place])     01/25/2017 - 02/07/2017 Radiation Therapy    Site/dose:    1)  Whole Brain: 30 Gy in 10 fractions 2) Left Lung: 30 Gy in 10 fractions      History of present illness (01/20/2017):  Erika Hunter is a 46 yo Caucasian female, with past medical history of 20 pack year smoking, otherwise healthy, presented with one week history of headache and some difficulty at work for routine jobs, she presented to ED on 3/2, brain MRI showed 2 large masses, one in the right frontal lobe, 1 in the left or subtotal, and another small falcine mass on the left, highly suspicious for brain metastasis. CT chest, abdomen and pelvis showed a large left upper lobe mass, suspicious for primary lung cancer. She was admitted to hospital, currently on dexamethasone 6 mg 4 times a day, her headache has much improved. She appears to be sleepy when I saw her, but is oriented, and answered questions appropriately. Review of system also is positive for mild fatigue, decrease in appetite lately, and a total of 80 pounds weight loss in the past year.  CURRENTLY THERAPY: Observation   INTERVAL HISTORY: Erika Luis returns for follow-up. I initially saw her in the hospital when she was diagnosed with metastatic small cell lung cancer. She has completed palliative radiation to brain and chest last week, she tolerated treatment very well. Her overall condition has much improved.  She is in a rehab once daily, 0.5hr, she uses wheelchaire most of time, she walks without assistance, no idzziness or balance issues.  She works independently. She denies headaches or other  significant pain, denies dyspnea or hemoptysis. She still has moderate fatigue, but is able to take care of herself and tolerate light activities well.  REVIEW OF SYSTEMS:   Constitutional: Denies fevers, chills, (+) weight loss  Eyes: Denies blurriness of vision Ears, nose, mouth, throat, and face: Denies mucositis or sore throat Respiratory: Denies cough, dyspnea or wheezes Cardiovascular: Denies palpitation, chest discomfort or lower extremity  swelling Gastrointestinal:  Denies nausea, heartburn or change in bowel habits Skin: Denies abnormal skin rashes Lymphatics: Denies new lymphadenopathy or easy bruising Neurological:Denies numbness, tingling or new weaknesses Behavioral/Psych: Mood is stable, no new changes  All other systems were reviewed with the patient and are negative.  MEDICAL HISTORY:  Past Medical History:  Diagnosis Date  . Alcohol dependence ([place]) 01/24/2017  . Anxiety    panic disorder  . Asthma   . Cervical dysplasia 11/19/1988   S/P conization  . COPD (chronic obstructive pulmonary disease) ([place]) 01/24/2017  . Depression   . Hyperlipidemia   . Steatohepatitis    h/o heavy alcohol use, hepatitis profile negative for A, B    SURGICAL HISTORY: Past Surgical History:  Procedure Laterality Date  . Cold knife conization  Early 2000s   Pre-cancerous lesions  . DILATION AND CURETTAGE OF UTERUS    . LUNG BIOPSY Left 01/21/2017   Procedure: LUNG BIOPSY LEFT UPPER LOBE;  Surgeon: Grace Isaac, MD;  Location: [facility];  Service: Thoracic;  Laterality: Left;  Marland Kitchen VIDEO BRONCHOSCOPY N/A 01/21/2017   Procedure: VIDEO BRONCHOSCOPY;  Surgeon: Grace Isaac, MD;  Location: [facility] OR;  Service: Thoracic;  Laterality: N/A;    I have reviewed the social history and family history with the patient and they are unchanged from previous note.  ALLERGIES:  has No Known Allergies.  MEDICATIONS:  Current Outpatient Prescriptions  Medication Sig Dispense Refill  . acetaminophen (TYLENOL) 325 MG tablet Take 2 tablets (650 mg total) by mouth every 6 (six) hours as needed for mild pain (or Fever >/= 101). 60 tablet 0  . albuterol (PROAIR HFA) 108 (90 Base) MCG/ACT inhaler INHALE 2 PUFFS INTO LUNGS EVERY 4 HOURS AS NEEDED FOR COUGH, SHORTNESS OF BREATH, OR WHEEZING 9 each 1  . dexamethasone (DECADRON) 4 MG tablet Take 4 mg by mouth 3 (three) times daily. Tapered '4mg'$  oral 1 tab tid x 1 week, Then '4mg'$  1 tab bid x 1 week Then 1 tab  daily x 1 week then1/2 tab('2mg'$ ) daily x 1 week starting 03/01/17  Then stop    . fludrocortisone (FLORINEF) 0.1 MG tablet Take 3 tablets (0.3 mg total) by mouth daily. 90 tablet 0  . levETIRAcetam (KEPPRA) 500 MG tablet Take 1 tablet (500 mg total) by mouth 2 (two) times daily. 60 tablet 0  . midodrine (PROAMATINE) 10 MG tablet Take 1 tablet (10 mg total) by mouth 3 (three) times daily with meals. 90 tablet 0  . Multiple Vitamins-Minerals (MULTI-VITAMIN GUMMIES PO) Take 2 each by mouth daily.     . nicotine (NICODERM CQ - DOSED IN MG/24 HOURS) 21 mg/24hr patch Place 1 patch (21 mg total) onto the skin daily. 28 patch 0  . oxyCODONE (OXY IR/ROXICODONE) 5 MG immediate release tablet Take 1 tablet (5 mg total) by mouth every 4 (four) hours as needed for moderate pain. 12 tablet 0  . venlafaxine XR (EFFEXOR-XR) 150 MG 24 hr capsule TAKE 1 CAPSULE BY MOUTH ONCE DAILY ALONG WITH '75MG'$  CAPSULE 90 capsule 3  . venlafaxine XR (EFFEXOR-XR) 75 MG  24 hr capsule TAKE 1 CAPSULE BY MOUTH ONCE DAILY ALONG WITH '150MG'$  CAPSULE 90 capsule 3  . polyvinyl alcohol (ARTIFICIAL TEARS) 1.4 % ophthalmic solution Place 1 drop into both eyes every 6 (six) hours as needed for dry eyes.     Marland Kitchen zolpidem (AMBIEN) 5 MG tablet Take 1 tablet (5 mg total) by mouth at bedtime as needed for sleep. 30 tablet 1   No current facility-administered medications for this visit.     PHYSICAL EXAMINATION: ECOG PERFORMANCE STATUS: 2 - Symptomatic, <50% confined to bed  Vitals:   02/12/17 1436  BP: (!) 150/93  Pulse: 75  Temp: 98.4 F (36.9 C)   Filed Weights   02/12/17 1436  Weight: 113 lb 14.4 oz (51.7 kg)    GENERAL:alert, no distress and comfortable SKIN: skin color, texture, turgor are normal, no rashes or significant lesions EYES: normal, Conjunctiva are pink and non-injected, sclera clear OROPHARYNX:no exudate, no erythema and lips, buccal mucosa, and tongue normal  NECK: supple, thyroid normal size, non-tender, without  nodularity LYMPH:  no palpable lymphadenopathy in the cervical, axillary or inguinal LUNGS: clear to auscultation and percussion with normal breathing effort HEART: regular rate & rhythm and no murmurs and no lower extremity edema ABDOMEN:abdomen soft, non-tender and normal bowel sounds Musculoskeletal:no cyanosis of digits and no clubbing  NEURO: alert & oriented x 3 with fluent speech, no focal motor/sensory deficits  LABORATORY DATA:  I have reviewed the data as listed CBC Latest Ref Rng & Units 01/25/2017 01/24/2017 01/24/2017  WBC 4.0 - 10.5 K/uL 11.8(H) 10.1 10.9(H)  Hemoglobin 12.0 - 15.0 g/dL 14.2 14.2 13.9  Hematocrit 36.0 - 46.0 % 42.3 42.2 41.4  Platelets 150 - 400 K/uL 398 400 370     CMP Latest Ref Rng & Units 01/31/2017 01/30/2017 01/27/2017  Glucose 65 - 99 mg/dL - 159(H) 141(H)  BUN 6 - 20 mg/dL - 21(H) 18  Creatinine 0.44 - 1.00 mg/dL 0.49 0.50 0.52  Sodium 135 - 145 mmol/L - 132(L) 136  Potassium 3.5 - 5.1 mmol/L - 3.4(L) 4.0  Chloride 101 - 111 mmol/L - 97(L) 103  CO2 22 - 32 mmol/L - 27 27  Calcium 8.9 - 10.3 mg/dL - 8.6(L) 8.7(L)  Total Protein 6.5 - 8.1 g/dL - - -  Total Bilirubin 0.3 - 1.2 mg/dL - - -  Alkaline Phos 38 - 126 U/L - - -  AST 15 - 41 U/L - - -  ALT 14 - 54 U/L - - -   PATHOLOGY REPORT  Diagnosis 01/21/2017 BRONCHIAL BRUSHING (A) LEFT UPPER LOBE (SPECIMEN 1 OF 1 COLLECTED 01/21/2017) MALIGNANT CELLS PRESENT, CONSISTENT WITH SMALL CELL CARCINOMA. SEE COMMENT. COMMENT: PLEASE ALSO SEE THE PATIENT'S CONCURRENT SURGICAL SPECIMEN, [phone].  Diagnosis 01/21/2017 1. Lung, biopsy, Left Upper Lobe - NECROTIC MATERIAL. 2. Lung, biopsy, Left Upper Lobe #2 - SMALL CELL CARCINOMA. - SEE COMMENT. 3. Lung, biopsy, Left Upper Lobe #3 - SMALL CELL CARCINOMA. - SEE COMMENT. 4. Lung, biopsy, Left Upper Lobe #4 - SMALL CELL CARCINOMA. - SEE COMMENT. Microscopic Comment 2. - 4. Dr Tresa Moore has reviewed parts 2 - 4 and concurs with this interpretation. Dr  Servando Snare was paged on 01/22/2017. Additional testing can be performed upon clinician request.   RADIOGRAPHIC STUDIES: I have personally reviewed the radiological images as listed and agreed with the findings in the report. No results found.   MR brain w wo contrast 01/18/2017 Three total mass lesions as above, highly concerning for intracranial metastatic disease.  Extensive vasogenic edema about the lesion within the right frontal lobe with associated 14 mm of right-to-left shift. Lateral ventricles are partially effaced without hydrocephalus or evidence for ventricular trapping at this time.  CT chest, abdomen and pelvis with contrast on 01/19/2017 IMPRESSION: 1. Large (at least 8 cm) enhancing left upper lobe lung mass encasing the left main pulmonary artery and invading the left pulmonary veins and left atrium. There is extension into the distal left mainstem bronchus. 2. Nonspecific 12 mm subcutaneous nodule in the anterior abdominal wall, may be soft tissue metastasis or sebaceous cyst. 3. There is otherwise no evidence of primary or metastatic malignancy in the chest, abdomen, or pelvis. 4. Moderate emphysema. These results will be called to the ordering clinician or representative by the Radiologist Assistant, and communication documented in the PACS or zVision Dashboard.   ASSESSMENT & PLAN:  46 y.o. female with 19 PY smoking history, presented with headaches and difficulty on performingroutine jobs, fatigue and weight loss.   1. 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 Multiple brain mets (3) with vasogenic edema, and midline shift, on dexamethasone 3. History of anxiety and panic attack  4. Anorexia, improved 5. Deconditioning  Plan -Her performance status has improved significantly. She has responded to radiation well. -I strongly encouraged her to consider palliative chemotherapy. I reviewed the benefits and side effects of  chemotherapy, especially regimen of carboplatin and etoposide.  We discused the incurable and aggressive nature of her metastatic small cell lung cancer ient is very concerned about side effects form chemotherapy. She has limited social support. She declined chemotherapy. Patient appears to be alert oriented, and is able to understand and make a decision on her own. -I offered to call her daughter, she declined. -Tapering dexamethasone per radiology oncology -She will follow-up with Dr. Lisbeth Renshaw in a month, I'll see her back with lab in 2 months.   All questions were answered. The patient knows to call the clinic with any problems, questions or concerns. No barriers to learning was detected. I spent 25 minutes counseling the patient face to face. The total time spent in the appointment was 30 minutes and more than 50% was on counseling and review of test results     Truitt Merle, MD 02/12/17

## 2017-02-13 NOTE — Progress Notes (Signed)
  Radiation Oncology         [phone] ________________________________  Name: Erika Hunter MRN: [id number]  Date: 02/07/2017  DOB: 1971/01/13  End of Treatment Note  Diagnosis:   Extensive stage small cell carcinoma of the left lung with brain metastases  Indication for treatment:  Palliative  Radiation treatment dates:   01/25/17 - 02/07/17  Site/dose:    1) Whole Brain: 30 Gy in 10 fractions 2) Left Lung: 30 Gy in 10 fractions  Beams/energy:    1) Isodose Plan // 6X Photon 2) 3D // Pieter Partridge Photon  Narrative: The patient tolerated radiation treatment relatively well. The patient denied skin changes, nausea, difficulty swallowing, or headaches. The patient's daughter stated that the patient's mental state has improved with treatment.  Plan: The patient has completed radiation treatment. The patient will return to radiation oncology clinic for routine followup in one month. I advised them to call or return sooner if they have any questions or concerns related to their recovery or treatment.  ------------------------------------------------  Jodelle Gross, MD, PhD  This document serves as a record of services personally performed by Kyung Rudd, MD. It was created on his behalf by Darcus Austin, a trained medical scribe. The creation of this record is based on the scribe's personal observations and the provider's statements to them. This document has been checked and approved by the attending provider.

## 2017-02-15 ENCOUNTER — Encounter: Payer: Self-pay | Admitting: Hematology

## 2017-02-15 ENCOUNTER — Telehealth: Payer: Self-pay | Admitting: Hematology

## 2017-02-15 NOTE — Telephone Encounter (Signed)
lvm to inform pt of 6/6 appt at 1 pm per LOS

## 2017-02-19 ENCOUNTER — Telehealth: Payer: Self-pay | Admitting: Family Medicine

## 2017-02-19 ENCOUNTER — Non-Acute Institutional Stay (SKILLED_NURSING_FACILITY): Payer: Medicaid Other | Admitting: Adult Health

## 2017-02-19 ENCOUNTER — Encounter: Payer: Self-pay | Admitting: Adult Health

## 2017-02-19 DIAGNOSIS — C3492 Malignant neoplasm of unspecified part of left bronchus or lung: Secondary | ICD-10-CM | POA: Diagnosis not present

## 2017-02-19 DIAGNOSIS — C7931 Secondary malignant neoplasm of brain: Secondary | ICD-10-CM

## 2017-02-19 DIAGNOSIS — F1020 Alcohol dependence, uncomplicated: Secondary | ICD-10-CM

## 2017-02-19 DIAGNOSIS — J438 Other emphysema: Secondary | ICD-10-CM

## 2017-02-19 NOTE — Progress Notes (Signed)
Location:   [place] Room Number: 120 A Place of Service:  SNF (31)   CODE STATUS: Full Code  No Known Allergies  Chief Complaint  Patient presents with  . Discharge Note    Discharge    HPI:  She is being discharged to home with hospice care. She will not need home health services as hospice will provide. She will not need dme. She will need her prescriptions written and will follow up with her medical provider.    Past Medical History:  Diagnosis Date  . Alcohol dependence ([place]) 01/24/2017  . Anxiety    panic disorder  . Asthma   . Cervical dysplasia 11/19/1988   S/P conization  . COPD (chronic obstructive pulmonary disease) ([place]) 01/24/2017  . Depression   . Hyperlipidemia   . Steatohepatitis    h/o heavy alcohol use, hepatitis profile negative for A, B    Past Surgical History:  Procedure Laterality Date  . Cold knife conization  Early 2000s   Pre-cancerous lesions  . DILATION AND CURETTAGE OF UTERUS    . LUNG BIOPSY Left 01/21/2017   Procedure: LUNG BIOPSY LEFT UPPER LOBE;  Surgeon: Grace Isaac, MD;  Location: [place];  Service: Thoracic;  Laterality: Left;  Marland Kitchen VIDEO BRONCHOSCOPY N/A 01/21/2017   Procedure: VIDEO BRONCHOSCOPY;  Surgeon: Grace Isaac, MD;  Location: [facility] OR;  Service: Thoracic;  Laterality: N/A;    Social History   Social History  . Marital status: Single    Spouse name: n/a  . Number of children: 2  . Years of education: High School   Occupational History  . Holiday representative   Social History Main Topics  . Smoking status: Current Every Day Smoker    Packs/day: 1.00    Years: 25.00  . Smokeless tobacco: Never Used  . Alcohol use Yes     Comment: wine - few glasses/day  . Drug use: No  . Sexual activity: Yes   Other Topics Concern  . Not on file   Social History Narrative   Marital status: divorced; dating, moved to [place] from [place] to be with her boyfriend..      Children; 2 children; no  grandchildren      Lives: with a co-worker      Employment: clerk at gas station x 5.5 years      Tobacco; 3/4 ppd since 45 yo      Alcohol:  3 glasses wine per day.      Drugs: none      Exercise:  none   Family History  Problem Relation Age of Onset  . COPD Mother   . COPD Sister   . Cancer Sister 30    ovarian  . Diabetes Sister   . Mental illness Sister       VITAL SIGNS BP 118/64   Pulse 74   Temp 97.2 F (36.2 C)   Resp 16   Ht '5\' 5"'$  (1.651 m)   Wt 113 lb 14 oz (51.7 kg)   LMP 05/01/2012   SpO2 96%   BMI 18.95 kg/m   Patient's Medications  New Prescriptions   No medications on file  Previous Medications   ACETAMINOPHEN (TYLENOL) 325 MG TABLET    Take 2 tablets (650 mg total) by mouth every 6 (six) hours as needed for mild pain (or Fever >/= 101).   ALBUTEROL (PROAIR HFA) 108 (90 BASE) MCG/ACT INHALER    INHALE 2  PUFFS INTO LUNGS EVERY 4 HOURS AS NEEDED FOR COUGH, SHORTNESS OF BREATH, OR WHEEZING   DEXAMETHASONE (DECADRON) 4 MG TABLET    Take 4 mg by mouth 3 (three) times daily. Tapered '4mg'$  oral 1 tab tid x 1 week, Then '4mg'$  1 tab bid x 1 week Then 1 tab daily x 1 week then1/2 tab('2mg'$ ) daily x 1 week starting 03/01/17  Then stop   EMOLLIENT (BIAFINE) CREAM    Apply to head and chest topically daily.  Do not apply 4 hours prior to radiation treatments   FLUDROCORTISONE (FLORINEF) 0.1 MG TABLET    Take 3 tablets (0.3 mg total) by mouth daily.   GUAIFENESIN (ROBITUSSIN) 100 MG/5ML SYRUP    Give 10 cc by mouth every 6 hours as needed for cough   LEVETIRACETAM (KEPPRA) 500 MG TABLET    Take 1 tablet (500 mg total) by mouth 2 (two) times daily.   MIDODRINE (PROAMATINE) 10 MG TABLET    Take 1 tablet (10 mg total) by mouth 3 (three) times daily with meals.   MULTIPLE VITAMINS-MINERALS (MULTI-VITAMIN GUMMIES PO)    Take 2 each by mouth daily.    OXYCODONE (OXY IR/ROXICODONE) 5 MG IMMEDIATE RELEASE TABLET    Take 1 tablet (5 mg total) by mouth every 4 (four) hours as needed  for moderate pain.   POLYVINYL ALCOHOL (ARTIFICIAL TEARS) 1.4 % OPHTHALMIC SOLUTION    Place 1 drop into both eyes every 6 (six) hours as needed for dry eyes.    VENLAFAXINE XR (EFFEXOR-XR) 150 MG 24 HR CAPSULE    TAKE 1 CAPSULE BY MOUTH ONCE DAILY ALONG WITH '75MG'$  CAPSULE   VENLAFAXINE XR (EFFEXOR-XR) 75 MG 24 HR CAPSULE    TAKE 1 CAPSULE BY MOUTH ONCE DAILY ALONG WITH '150MG'$  CAPSULE  Modified Medications   No medications on file  Discontinued Medications   NICOTINE (NICODERM CQ - DOSED IN MG/24 HOURS) 21 MG/24HR PATCH    Place 1 patch (21 mg total) onto the skin daily.   ZOLPIDEM (AMBIEN) 5 MG TABLET    Take 1 tablet (5 mg total) by mouth at bedtime as needed for sleep.     SIGNIFICANT DIAGNOSTIC EXAMS  Review of Systems  Constitutional: Negative for malaise/fatigue.  Respiratory: Negative for cough and shortness of breath.   Cardiovascular: Negative for chest pain, palpitations and leg swelling.  Gastrointestinal: Negative for abdominal pain, constipation and heartburn.  Musculoskeletal: Negative for back pain, joint pain and myalgias.  Skin: Negative.   Neurological: Negative for dizziness.  Psychiatric/Behavioral: The patient is not nervous/anxious.     Physical Exam  Constitutional: She is oriented to person, place, and time. No distress.  Eyes: Conjunctivae are normal.  Neck: Neck supple. No JVD present. No thyromegaly present.  Cardiovascular: Normal rate, regular rhythm and intact distal pulses.   Respiratory: Effort normal and breath sounds normal. No respiratory distress. She has no wheezes.  GI: Soft. Bowel sounds are normal. She exhibits no distension. There is no tenderness.  Musculoskeletal: She exhibits no edema.  Able to move all extremities   Lymphadenopathy:    She has no cervical adenopathy.  Neurological: She is alert and oriented to person, place, and time.  Skin: Skin is warm and dry. She is not diaphoretic.  Psychiatric: She has a normal mood and affect.       ASSESSMENT/ PLAN:   Will discharge her to home with hospice care who will provide any necessary dme and home health services. Her prescriptions have been  written for a 30 day supply of her medications. She will follow up with her medical provider.    Time spent with patient 45   minutes >50% time spent counseling; reviewing medical record; tests; labs; and developing future plan of care   Ok Edwards NP [facility]  Contact [phone] Monday through Friday 8am- 5pm  After hours call [phone]

## 2017-02-19 NOTE — Telephone Encounter (Signed)
I called pt and left her a VM that I needed more information on her release. Please send to MR

## 2017-02-19 NOTE — Telephone Encounter (Signed)
Pt has been diagnosed with cancer and she needs to get a copy of her records.  [phone]

## 2017-02-20 ENCOUNTER — Emergency Department (HOSPITAL_COMMUNITY)
Admission: EM | Admit: 2017-02-20 | Discharge: 2017-02-20 | Disposition: A | Discharge status: Home / Self Care | Payer: Medicaid Other | Attending: Emergency Medicine | Admitting: Emergency Medicine

## 2017-02-20 ENCOUNTER — Encounter (HOSPITAL_COMMUNITY): Payer: Self-pay

## 2017-02-20 ENCOUNTER — Emergency Department (HOSPITAL_COMMUNITY): Payer: Medicaid Other

## 2017-02-20 ENCOUNTER — Telehealth: Payer: Self-pay | Admitting: *Deleted

## 2017-02-20 DIAGNOSIS — Z85841 Personal history of malignant neoplasm of brain: Secondary | ICD-10-CM | POA: Diagnosis not present

## 2017-02-20 DIAGNOSIS — Z79899 Other long term (current) drug therapy: Secondary | ICD-10-CM | POA: Insufficient documentation

## 2017-02-20 DIAGNOSIS — L03116 Cellulitis of left lower limb: Secondary | ICD-10-CM | POA: Diagnosis not present

## 2017-02-20 DIAGNOSIS — M7989 Other specified soft tissue disorders: Secondary | ICD-10-CM | POA: Diagnosis present

## 2017-02-20 DIAGNOSIS — F17200 Nicotine dependence, unspecified, uncomplicated: Secondary | ICD-10-CM | POA: Diagnosis not present

## 2017-02-20 DIAGNOSIS — J449 Chronic obstructive pulmonary disease, unspecified: Secondary | ICD-10-CM | POA: Diagnosis not present

## 2017-02-20 DIAGNOSIS — Z85118 Personal history of other malignant neoplasm of bronchus and lung: Secondary | ICD-10-CM | POA: Diagnosis not present

## 2017-02-20 HISTORY — DX: Malignant neoplasm of unspecified part of unspecified bronchus or lung: C34.90

## 2017-02-20 HISTORY — DX: Malignant neoplasm of brain, unspecified: C71.9

## 2017-02-20 MED ORDER — CEPHALEXIN 500 MG PO CAPS
500.0000 mg | ORAL_CAPSULE | Freq: Once | ORAL | Status: AC
  Administered 2017-02-20: 500 mg via ORAL
  Filled 2017-02-20: qty 1

## 2017-02-20 MED ORDER — SULFAMETHOXAZOLE-TRIMETHOPRIM 800-160 MG PO TABS: 1.0000 | ORAL_TABLET | Freq: Two times a day (BID) | ORAL | 0 refills | Status: AC

## 2017-02-20 MED ORDER — CEPHALEXIN 500 MG PO CAPS
500.0000 mg | ORAL_CAPSULE | Freq: Four times a day (QID) | ORAL | 0 refills | Status: DC
Start: 2017-02-20 — End: 2017-03-21

## 2017-02-20 MED ORDER — SULFAMETHOXAZOLE-TRIMETHOPRIM 800-160 MG PO TABS
1.0000 | ORAL_TABLET | Freq: Once | ORAL | Status: AC
  Administered 2017-02-20: 1 via ORAL
  Filled 2017-02-20: qty 1

## 2017-02-20 MED ORDER — OXYCODONE-ACETAMINOPHEN 5-325 MG PO TABS: 0.5000 | ORAL_TABLET | Freq: Four times a day (QID) | ORAL | 0 refills | Status: DC | PRN

## 2017-02-20 NOTE — Telephone Encounter (Signed)
Spoke with Amy @ [facility] and informed her that Dr. Burr Medico will be the attending for hospice and to please activate hospice standing orders.  Amy voiced understanding.

## 2017-02-20 NOTE — Telephone Encounter (Signed)
Received call from Amy @ [facility] re:  Pt had requested hospice services yesterday.  Amy wanted to know if Dr. Burr Medico will be the hospice attending for pt. Amy's   Phone     [phone].

## 2017-02-20 NOTE — Telephone Encounter (Signed)
I have also called pt's daughter Velna Hatchet and informed her about her decision of not taking palliative chemo and enrollment to hospice. She is aware of her decisions and appreciated the call.   Truitt Merle MD

## 2017-02-20 NOTE — ED Provider Notes (Signed)
[facility] DEPT Provider Note   CSN: [id number] Arrival date & time: 02/20/17  1846     History   Chief Complaint Chief Complaint  Patient presents with  . Joint Swelling    RIGHT    HPI Erika Hunter is a 46 y.o. female.  The history is provided by the patient. No language interpreter was used.   Erika Hunter is a 46 y.o. female who presents to the Emergency Department complaining of knee pain.  She has a history of metastatic cancer and has chosen to go into hospice care but this is not quite set up. She fell a few weeks ago on her right knee and sustained an abrasion at that time. She's noticed over the last week that there is been increased redness over the last few days increased pain in her right anterior knee. No fevers, chest pain. She has an ongoing cough that is unchanged. Symptoms are moderate and constant in nature. Past Medical History:  Diagnosis Date  . Alcohol dependence ([place]) 01/24/2017  . Anxiety    panic disorder  . Asthma   . Brain cancer ([place])   . Cervical dysplasia 11/19/1988   S/P conization  . COPD (chronic obstructive pulmonary disease) ([place]) 01/24/2017  . Depression   . Hyperlipidemia   . Lung cancer ([place])   . Steatohepatitis    h/o heavy alcohol use, hepatitis profile negative for A, B    Patient Active Problem List   Diagnosis Date Noted  . Orthostatic hypotension 01/26/2017  . Goals of care, counseling/discussion   . Palliative care by specialist   . Alcohol dependence ([place]) 01/24/2017  . Headache 01/24/2017  . COPD (chronic obstructive pulmonary disease) ([place]) 01/24/2017  . Brain metastasis ([place]) 01/23/2017  . 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 Lung mass   . Brain tumor ([place]) 01/18/2017  . Depression 09/11/2016  . History of abnormal cervical Pap smear 09/11/2016  . Hyperlipidemia with target low density lipoprotein (LDL) cholesterol less than 100 mg/dL 04/17/2012  . Generalized anxiety disorder 04/17/2012  . Tobacco  user 04/17/2012    Past Surgical History:  Procedure Laterality Date  . Cold knife conization  Early 2000s   Pre-cancerous lesions  . DILATION AND CURETTAGE OF UTERUS    . LUNG BIOPSY Left 01/21/2017   Procedure: LUNG BIOPSY LEFT UPPER LOBE;  Surgeon: Grace Isaac, MD;  Location: [facility];  Service: Thoracic;  Laterality: Left;  Marland Kitchen VIDEO BRONCHOSCOPY N/A 01/21/2017   Procedure: VIDEO BRONCHOSCOPY;  Surgeon: Grace Isaac, MD;  Location[facility];  Service: Thoracic;  Laterality: N/A;    OB History    Gravida Para Term Preterm AB Living   '1       1 2   '$ SAB TAB Ectopic Multiple Live Births                   Home Medications    Prior to Admission medications   Medication Sig Start Date End Date Taking? Authorizing Provider  albuterol (PROAIR HFA) 108 (90 Base) MCG/ACT inhaler INHALE 2 PUFFS INTO LUNGS EVERY 4 HOURS AS NEEDED FOR COUGH, SHORTNESS OF BREATH, OR WHEEZING 12/21/16  Yes Wardell Honour, MD  emollient (BIAFINE) cream Apply to head and chest topically daily.  Do not apply 4 hours prior to radiation treatments   Yes Historical Provider, MD  fludrocortisone (FLORINEF) 0.1 MG tablet Take 3 tablets (0.3 mg total) by mouth daily. 01/31/17  Yes [name], MD  levETIRAcetam (KEPPRA) 500 MG tablet Take 1 tablet (500 mg total) by mouth 2 (two) times daily. 01/31/17  Yes [name], MD  midodrine (PROAMATINE) 10 MG tablet Take 1 tablet (10 mg total) by mouth 3 (three) times daily with meals. 01/31/17  Yes [name], MD  Multiple Vitamins-Minerals (MULTI-VITAMIN GUMMIES PO) Take 2 each by mouth daily.    Yes Historical Provider, MD  oxyCODONE (OXY IR/ROXICODONE) 5 MG immediate release tablet Take 1 tablet (5 mg total) by mouth every 4 (four) hours as needed for moderate pain. 01/31/17  Yes [name], MD  venlafaxine XR (EFFEXOR-XR) 150 MG 24 hr capsule TAKE 1 CAPSULE BY MOUTH ONCE DAILY ALONG WITH '75MG'$  CAPSULE 09/11/16  Yes Chelle Jeffery, PA-C  venlafaxine XR (EFFEXOR-XR) 75 MG 24 hr capsule  TAKE 1 CAPSULE BY MOUTH ONCE DAILY ALONG WITH '150MG'$  CAPSULE 09/11/16  Yes Chelle Jeffery, PA-C  acetaminophen (TYLENOL) 325 MG tablet Take 2 tablets (650 mg total) by mouth every 6 (six) hours as needed for mild pain (or Fever >/= 101). 01/31/17   [name], MD  cephALEXin (KEFLEX) 500 MG capsule Take 1 capsule (500 mg total) by mouth 4 (four) times daily. 02/20/17   Quintella Reichert, MD  dexamethasone (DECADRON) 4 MG tablet Take 4 mg by mouth as directed. Taper: Take 1 Tab (4 mg) bid x 7 Days, Then Take 1 Tab (4 mg) daily x 8 Days, Then Take 1/2 Tab (2 mg) daily for 7 Days THEN STOP 02/08/17   Historical Provider, MD  guaifenesin (ROBITUSSIN) 100 MG/5ML syrup Give 10 cc by mouth every 6 hours as needed for cough    Historical Provider, MD  oxyCODONE-acetaminophen (PERCOCET/ROXICET) 5-325 MG tablet Take 0.5-1 tablets by mouth every 6 (six) hours as needed for severe pain. 02/20/17   Quintella Reichert, MD  polyvinyl alcohol (ARTIFICIAL TEARS) 1.4 % ophthalmic solution Place 1 drop into both eyes every 6 (six) hours as needed for dry eyes.     Historical Provider, MD  sulfamethoxazole-trimethoprim (BACTRIM DS,SEPTRA DS) 800-160 MG tablet Take 1 tablet by mouth 2 (two) times daily. 02/20/17 02/27/17  Quintella Reichert, MD    Family History Family History  Problem Relation Age of Onset  . COPD Mother   . COPD Sister   . Cancer Sister 30    ovarian  . Diabetes Sister   . Mental illness Sister     Social History Social History  Substance Use Topics  . Smoking status: Current Every Day Smoker    Packs/day: 1.00    Years: 25.00  . Smokeless tobacco: Never Used  . Alcohol use Yes     Comment: wine - few glasses/day     Allergies   Patient has no known allergies.   Review of Systems Review of Systems  All other systems reviewed and are negative.    Physical Exam Updated Vital Signs BP 99/76 (BP Location: Left Arm)   Pulse (!) 125   Temp 97.9 F (36.6 C) (Oral)   Resp 16   Ht '5\' 5"'$  (1.651 m)    Wt 110 lb (49.9 kg)   LMP 05/01/2012   SpO2 100%   BMI 18.30 kg/m   Physical Exam  Constitutional: She is oriented to person, place, and time. She appears well-developed.  Thin  HENT:  Head: Normocephalic and atraumatic.  Cardiovascular: Regular rhythm.   No murmur heard. Tachycardic  Pulmonary/Chest: Effort normal. No respiratory distress.  Occasional rhonchi bilaterally  Abdominal: Soft. There is no tenderness. There is  no rebound and no guarding.  Musculoskeletal:  2+ DP pulses bilaterally. There is erythema and swelling to the right anterior knee with 2 superficial abrasions in the prepatellar region. There is tenderness over the prepatellar bursa. Flexion and extension is intact throughout the knee. No palpable knee effusion on examination.  Neurological: She is alert and oriented to person, place, and time.  Skin: Skin is warm and dry.  Psychiatric: She has a normal mood and affect. Her behavior is normal.  Nursing note and vitals reviewed.    ED Treatments / Results  Labs (all labs ordered are listed, but only abnormal results are displayed) Labs Reviewed - No data to display  EKG  EKG Interpretation None       Radiology Dg Knee Complete 4 Views Right  Result Date: 02/20/2017 CLINICAL DATA:  Right anterior knee pain and swelling EXAM: RIGHT KNEE - COMPLETE 4+ VIEW COMPARISON:  None. FINDINGS: No fracture or malalignment. No significant joint effusion. The joint space compartments are relatively maintained. Anterior soft tissue swelling superior to the patella. IMPRESSION: No acute osseous abnormality. Electronically Signed   By: Donavan Foil M.D.   On: 02/20/2017 19:26    Procedures Procedures (including critical care time)  Medications Ordered in ED Medications  sulfamethoxazole-trimethoprim (BACTRIM DS,SEPTRA DS) 800-160 MG per tablet 1 tablet (not administered)  cephALEXin (KEFLEX) capsule 500 mg (not administered)     Initial Impression / Assessment  and Plan / ED Course  I have reviewed the triage vital signs and the nursing notes.  Pertinent labs & imaging results that were available during my care of the patient were reviewed by me and considered in my medical decision making (see chart for details).     Patient with advanced cancer currently on hospice here for knee swelling and pain. She is on a Decadron taper currently. Discussed with patient findings of cellulitis, question septic bursitis of the right knee. She is requesting to go home on oral antibiotics and does not want admission to the hospital. Discussed with patient her rapid heart rate and concern for severe infection and she understands this. Plan to start Bactrim and Keflex for cellulitis/septic bursitis. There is no evidence of septic joints on examination. Discussed that she may need to return for I&D if she experiences increased swelling or pain. Discussed outpatient follow-up and return precautions.  Final Clinical Impressions(s) / ED Diagnoses   Final diagnoses:  Cellulitis of left lower extremity    New Prescriptions New Prescriptions   CEPHALEXIN (KEFLEX) 500 MG CAPSULE    Take 1 capsule (500 mg total) by mouth 4 (four) times daily.   OXYCODONE-ACETAMINOPHEN (PERCOCET/ROXICET) 5-325 MG TABLET    Take 0.5-1 tablets by mouth every 6 (six) hours as needed for severe pain.   SULFAMETHOXAZOLE-TRIMETHOPRIM (BACTRIM DS,SEPTRA DS) 800-160 MG TABLET    Take 1 tablet by mouth 2 (two) times daily.     Quintella Reichert, MD 02/20/17 2053

## 2017-02-20 NOTE — ED Triage Notes (Addendum)
PT C/O RIGHT KNEE PAIN AND SWELLING X2 WEEKS. PT FELL OFF OF A CURB IN A WHEELCHAIR AT [facility] AND SUSTAINED A SCRAP ON THE KNEE. PT WAS DISCHARGED YESTERDAY, AND THE FAMILY NOTICED THE REDNESS AND SWELLING. PT DENIES FEVER. LAST RADIATION TX WAS 1 WEEK AGO FOR LUNG AND BRAIN CA.

## 2017-02-20 NOTE — Telephone Encounter (Signed)
Yes, sure.   Truitt Merle MD

## 2017-02-20 NOTE — Telephone Encounter (Signed)
I left VM with pt and informed her that PCP can access all her medical records from [facility] and [facility]. I asked her to CB if she has any further questions.

## 2017-02-22 ENCOUNTER — Telehealth: Payer: Self-pay | Admitting: *Deleted

## 2017-02-22 NOTE — Telephone Encounter (Signed)
Received faxed notice from [facility] re:  Pt was admitted to hospice services on  02/21/17.

## 2017-03-04 ENCOUNTER — Telehealth: Payer: Self-pay

## 2017-03-04 NOTE — Telephone Encounter (Signed)
Yes, OK to prescribe ambien ER 12.'5mg'$  daily, #30 with 2 refills. She is under hospice care now. Thanks   Truitt Merle MD

## 2017-03-04 NOTE — Telephone Encounter (Signed)
Pt states that Dr Burr Medico gave her Lorrin Mais for sleep but she is only sleeping 2-3 hours. She is asking if there is an extended release she can take. She uses [address].

## 2017-03-05 ENCOUNTER — Telehealth: Payer: Self-pay | Admitting: *Deleted

## 2017-03-05 MED ORDER — ZOLPIDEM TARTRATE ER 12.5 MG PO TBCR: 12.5000 mg | EXTENDED_RELEASE_TABLET | Freq: Every evening | ORAL | 2 refills | Status: DC | PRN

## 2017-03-05 NOTE — Addendum Note (Signed)
Addended by: Janace Hoard on: 03/05/2017 11:16 AM   Modules accepted: Orders

## 2017-03-05 NOTE — Telephone Encounter (Signed)
Called patient to alter fu on 03-21-17 to 03-19-17 @ 9:30 am, no answer mailed appt. card

## 2017-03-05 NOTE — Telephone Encounter (Signed)
Called in Ambien to pharmacy. Unable to leave message on pt phone informing her of this.

## 2017-03-08 ENCOUNTER — Telehealth: Payer: Self-pay | Admitting: *Deleted

## 2017-03-08 MED ORDER — ZOLPIDEM TARTRATE ER 12.5 MG PO TBCR: 12.5000 mg | EXTENDED_RELEASE_TABLET | Freq: Every evening | ORAL | 2 refills | Status: DC | PRN

## 2017-03-08 NOTE — Telephone Encounter (Signed)
Call from pt reporting Ambien script is over $100 at [facility], just $30 at [facility] new script to be sent to [facility], canceled prescription sent on 4/17. New script called to [facility].

## 2017-03-19 ENCOUNTER — Ambulatory Visit
Admission: RE | Admit: 2017-03-19 | Discharge: 2017-03-19 | Disposition: A | Discharge status: Home / Self Care | Payer: Medicaid Other | Source: Ambulatory Visit | Attending: Radiation Oncology | Admitting: Radiation Oncology

## 2017-03-21 ENCOUNTER — Encounter: Payer: Self-pay | Admitting: Radiation Oncology

## 2017-03-21 ENCOUNTER — Ambulatory Visit
Admission: RE | Admit: 2017-03-21 | Discharge: 2017-03-21 | Disposition: A | Discharge status: Home / Self Care | Payer: Medicaid Other | Source: Ambulatory Visit | Attending: Radiation Oncology | Admitting: Radiation Oncology

## 2017-03-21 ENCOUNTER — Ambulatory Visit: Payer: Self-pay | Admitting: Radiation Oncology

## 2017-03-21 ENCOUNTER — Telehealth: Payer: Self-pay

## 2017-03-21 VITALS — BP 131/84 | HR 114 | Temp 98.9°F | Resp 16 | Ht 65.0 in | Wt 122.8 lb

## 2017-03-21 DIAGNOSIS — C3492 Malignant neoplasm of unspecified part of left bronchus or lung: Secondary | ICD-10-CM | POA: Diagnosis not present

## 2017-03-21 DIAGNOSIS — C7931 Secondary malignant neoplasm of brain: Secondary | ICD-10-CM | POA: Insufficient documentation

## 2017-03-21 NOTE — Progress Notes (Addendum)
Erika Hunter 46 y.o. 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   Weight changes, if any: Wt Readings from Last 3 Encounters:  03/21/17 122 lb 12.8 oz (55.7 kg)  02/20/17 110 lb (49.9 kg)  02/19/17 113 lb 14 oz (51.7 kg)   Respiratory complaints, if any: SOB with exertion,coughing from a cold clear secretions Hemoptysis, if any: None Swallowing Problems/Pain/Difficulty swallowing:None Smoking Tobacco/Marijuana/Snuff/ETOH QHU:TMLYYTK smoker 1 P/D x 25 years, drinks wine no drugh usage Headache-frequency,location:Had a headache Monday-Wednesday lasting 15 minutes each day took Ibuprofen each day and the headaches resolved  Pain:None Fatigue: "States she is always tired".  Nausea/vomiting Ataxia(unsteady gait): None, using a cane and walker as needed Fine motor movement-picking up objects with fingers,holding objects, writing, weakness of lower extremities:  No problems Aphasia/Slurred speech: No problems Dizziness:None Visional changes(Blurred/Diplopia (double vision), blind spots and peripheral vision changes):Bad vision since age 7 to 73 does not wear glasses, plans to make eye exam appointment. Ring in ears:None Skin looks look normal at radiation site.  Has moon face. Appetite:Goos eating three meals a day or will drink ensure. Cognitive changes: Pt alert & oriented x 3 with fluent speech . No memory issues, answered all questions without difficult with word finding. 02-12-17 Saw Dr. Burr Medico Her performance status has improved significantly. She has responded to radiation well. -I strongly encouraged her to consider palliative chemotherapy. I reviewed the benefits and side effects of chemotherapy, especially regimen of carboplatin and etoposide. We discused the incurable and aggressive nature of her metastatic small cell lung cancer ient is very concerned about side effects form chemotherapy. She has  limited social support. She declined chemotherapy. Patient appears to be alert oriented, and is able to understand and make a decision on her own -She will follow-up with Dr. Lisbeth Renshaw in a month, I'll see her back with lab in 2 months. BP 131/84   Pulse (!) 114   Temp 98.9 F (37.2 C) (Oral)   Resp 16   Ht '5\' 5"'$  (1.651 m)   Wt 122 lb 12.8 oz (55.7 kg)   LMP 05/01/2012   SpO2 99%   BMI 20.43 kg/m

## 2017-03-21 NOTE — Telephone Encounter (Signed)
Submitted request for proair via cover my meds.  Awaiting response.  Typically 24-72 hours.

## 2017-03-21 NOTE — Progress Notes (Signed)
The patient was confused about her appointments this week and no-showed earlier this week. She came to the clinic asking to be seen today, and was told she would be worked in. Unfortunately she was not willing to wait to be seen once she was brought to the clinic area and waited about 30 minutes, and left the clinic.   I called but could not leave a message for the patient to appologize for the miscommunication. We will try to reschedule her appointment.     Carola Rhine, PAC

## 2017-03-22 ENCOUNTER — Telehealth: Payer: Self-pay | Admitting: Radiation Therapy

## 2017-03-22 NOTE — Addendum Note (Signed)
Encounter addended by: Malena Edman, RN on: 03/22/2017  4:28 PM<BR>    Actions taken: Order Reconciliation Section accessed

## 2017-03-22 NOTE — Telephone Encounter (Addendum)
Attempted to call pt to check in and see if she is interested in rescheduling the missed FU with Bryson Ha, or if she would like to push it out to after her brain MRI. I also wanted to confirm that she is not taking any steroids. The patient did not answer and her voicemail was full  Therefore I was unable to leave her a message. I then called her daughter's cell and left a message sharing the reason for my call and requesting a call back.    Follow-up:  Received a call back from the patient's daughter, Velna Hatchet. She let me know that her mother is doing well and requested that we not rescheduling the missed visit, but schedule her to be seen following her next brain MRI. She also mentioned that she believes that her mother is still taking steroids but she is not sure of the dose. She is going to reach out to her mother for [place] to confirm her current dose and call us back.   I asked her to please call the nursing area so that Vivien Rota would be able to help her out with steroid taper instructions since I am scheduled to leave at 12:00 for PAL today.   I am routing this message to Romie Jumper since she may be the one to answer the call so that she is aware of the call, Guadelupe Sabin RN, Worthy Flank PA and [employer] PA to keep everyone in the loop incase they are asked to assist with this patient.    Mont Dutton R.T.(R)(T) Special Procedures Navigator

## 2017-03-22 NOTE — Addendum Note (Signed)
Encounter addended by: Malena Edman, RN on: 03/22/2017  9:12 AM<BR>    Actions taken: Charge Capture section accepted

## 2017-03-25 ENCOUNTER — Emergency Department (HOSPITAL_COMMUNITY)

## 2017-03-25 ENCOUNTER — Encounter (HOSPITAL_COMMUNITY): Payer: Self-pay | Admitting: Emergency Medicine

## 2017-03-25 ENCOUNTER — Inpatient Hospital Stay (HOSPITAL_COMMUNITY)
Admission: EM | Admit: 2017-03-25 | Discharge: 2017-03-28 | DRG: 193 | Disposition: A | Discharge status: Hospice | Attending: Family Medicine | Admitting: Family Medicine

## 2017-03-25 DIAGNOSIS — F411 Generalized anxiety disorder: Secondary | ICD-10-CM | POA: Diagnosis present

## 2017-03-25 DIAGNOSIS — C3412 Malignant neoplasm of upper lobe, left bronchus or lung: Secondary | ICD-10-CM | POA: Diagnosis present

## 2017-03-25 DIAGNOSIS — Z818 Family history of other mental and behavioral disorders: Secondary | ICD-10-CM | POA: Diagnosis not present

## 2017-03-25 DIAGNOSIS — F329 Major depressive disorder, single episode, unspecified: Secondary | ICD-10-CM | POA: Diagnosis not present

## 2017-03-25 DIAGNOSIS — I160 Hypertensive urgency: Secondary | ICD-10-CM | POA: Diagnosis present

## 2017-03-25 DIAGNOSIS — J9621 Acute and chronic respiratory failure with hypoxia: Secondary | ICD-10-CM | POA: Diagnosis present

## 2017-03-25 DIAGNOSIS — F410 Panic disorder [episodic paroxysmal anxiety] without agoraphobia: Secondary | ICD-10-CM | POA: Diagnosis present

## 2017-03-25 DIAGNOSIS — C7931 Secondary malignant neoplasm of brain: Secondary | ICD-10-CM | POA: Diagnosis present

## 2017-03-25 DIAGNOSIS — F17210 Nicotine dependence, cigarettes, uncomplicated: Secondary | ICD-10-CM | POA: Diagnosis present

## 2017-03-25 DIAGNOSIS — J189 Pneumonia, unspecified organism: Principal | ICD-10-CM | POA: Diagnosis present

## 2017-03-25 DIAGNOSIS — Z8041 Family history of malignant neoplasm of ovary: Secondary | ICD-10-CM | POA: Diagnosis not present

## 2017-03-25 DIAGNOSIS — J440 Chronic obstructive pulmonary disease with acute lower respiratory infection: Secondary | ICD-10-CM | POA: Diagnosis present

## 2017-03-25 DIAGNOSIS — F1020 Alcohol dependence, uncomplicated: Secondary | ICD-10-CM | POA: Diagnosis present

## 2017-03-25 DIAGNOSIS — Z7952 Long term (current) use of systemic steroids: Secondary | ICD-10-CM | POA: Diagnosis not present

## 2017-03-25 DIAGNOSIS — D649 Anemia, unspecified: Secondary | ICD-10-CM | POA: Diagnosis present

## 2017-03-25 DIAGNOSIS — C3492 Malignant neoplasm of unspecified part of left bronchus or lung: Secondary | ICD-10-CM | POA: Diagnosis present

## 2017-03-25 DIAGNOSIS — Z825 Family history of asthma and other chronic lower respiratory diseases: Secondary | ICD-10-CM

## 2017-03-25 DIAGNOSIS — Y95 Nosocomial condition: Secondary | ICD-10-CM | POA: Diagnosis present

## 2017-03-25 DIAGNOSIS — J441 Chronic obstructive pulmonary disease with (acute) exacerbation: Secondary | ICD-10-CM | POA: Diagnosis present

## 2017-03-25 DIAGNOSIS — Z833 Family history of diabetes mellitus: Secondary | ICD-10-CM

## 2017-03-25 DIAGNOSIS — A419 Sepsis, unspecified organism: Secondary | ICD-10-CM

## 2017-03-25 DIAGNOSIS — Z7189 Other specified counseling: Secondary | ICD-10-CM | POA: Diagnosis not present

## 2017-03-25 DIAGNOSIS — E876 Hypokalemia: Secondary | ICD-10-CM | POA: Insufficient documentation

## 2017-03-25 DIAGNOSIS — Z720 Tobacco use: Secondary | ICD-10-CM | POA: Diagnosis present

## 2017-03-25 DIAGNOSIS — F32A Depression, unspecified: Secondary | ICD-10-CM | POA: Diagnosis present

## 2017-03-25 DIAGNOSIS — Z515 Encounter for palliative care: Secondary | ICD-10-CM | POA: Diagnosis not present

## 2017-03-25 DIAGNOSIS — D496 Neoplasm of unspecified behavior of brain: Secondary | ICD-10-CM | POA: Diagnosis present

## 2017-03-25 LAB — URINALYSIS, ROUTINE W REFLEX MICROSCOPIC
BACTERIA UA: NONE SEEN
BILIRUBIN URINE: NEGATIVE
Glucose, UA: NEGATIVE mg/dL
Ketones, ur: NEGATIVE mg/dL
Leukocytes, UA: NEGATIVE
NITRITE: NEGATIVE
PH: 6 (ref 5.0–8.0)
Protein, ur: 100 mg/dL — AB

## 2017-03-25 LAB — MAGNESIUM: Magnesium: 2 mg/dL (ref 1.7–2.4)

## 2017-03-25 LAB — COMPREHENSIVE METABOLIC PANEL
ALBUMIN: 2.7 g/dL — AB (ref 3.5–5.0)
ALK PHOS: 69 U/L (ref 38–126)
ALT: 21 U/L (ref 14–54)
ANION GAP: 11 (ref 5–15)
AST: 13 U/L — ABNORMAL LOW (ref 15–41)
BUN: 21 mg/dL — ABNORMAL HIGH (ref 6–20)
CALCIUM: 9.3 mg/dL (ref 8.9–10.3)
CHLORIDE: 98 mmol/L — AB (ref 101–111)
CO2: 29 mmol/L (ref 22–32)
CREATININE: 0.47 mg/dL (ref 0.44–1.00)
GFR calc non Af Amer: >60 mL/min (ref >60)
Glucose, Bld: 115 mg/dL — ABNORMAL HIGH (ref 65–99)
Potassium: 2.6 mmol/L — CL (ref 3.5–5.1)
SODIUM: 138 mmol/L (ref 135–145)
Total Bilirubin: 0.5 mg/dL (ref 0.3–1.2)
Total Protein: 6.9 g/dL (ref 6.5–8.1)

## 2017-03-25 LAB — CBC WITH DIFFERENTIAL/PLATELET
BASOS ABS: 0 10*3/uL (ref 0.0–0.1)
Basophils Relative: 0 %
Eosinophils Absolute: 0 10*3/uL (ref 0.0–0.7)
Eosinophils Relative: 0 %
HCT: 34.9 % — ABNORMAL LOW (ref 36.0–46.0)
Hemoglobin: 11.8 g/dL — ABNORMAL LOW (ref 12.0–15.0)
LYMPHS ABS: 0.2 10*3/uL — AB (ref 0.7–4.0)
Lymphocytes Relative: 3 %
MCH: 31.4 pg (ref 26.0–34.0)
MCHC: 33.8 g/dL (ref 30.0–36.0)
MCV: 92.8 fL (ref 78.0–100.0)
MONO ABS: 0.3 10*3/uL (ref 0.1–1.0)
Monocytes Relative: 4 %
Neutro Abs: 6.6 10*3/uL (ref 1.7–7.7)
Neutrophils Relative %: 93 %
PLATELETS: 247 10*3/uL (ref 150–400)
RBC: 3.76 MIL/uL — AB (ref 3.87–5.11)
RDW: 14.5 % (ref 11.5–15.5)
WBC: 7.1 10*3/uL (ref 4.0–10.5)

## 2017-03-25 LAB — TROPONIN I

## 2017-03-25 LAB — I-STAT CG4 LACTIC ACID, ED
LACTIC ACID, VENOUS: 1.45 mmol/L (ref 0.5–1.9)
LACTIC ACID, VENOUS: 2.56 mmol/L — AB (ref 0.5–1.9)

## 2017-03-25 MED ORDER — SODIUM CHLORIDE 0.9 % IV SOLN
INTRAVENOUS | Status: AC
  Administered 2017-03-25: 22:00:00 via INTRAVENOUS

## 2017-03-25 MED ORDER — THIAMINE HCL 100 MG/ML IJ SOLN: 100.0000 mg | Freq: Every day | INTRAMUSCULAR | Status: DC

## 2017-03-25 MED ORDER — LORAZEPAM 1 MG PO TABS
1.0000 mg | ORAL_TABLET | Freq: Four times a day (QID) | ORAL | Status: DC | PRN
  Filled 2017-03-25: qty 1

## 2017-03-25 MED ORDER — ADULT MULTIVITAMIN W/MINERALS CH
1.0000 | ORAL_TABLET | Freq: Every day | ORAL | Status: DC
  Administered 2017-03-26 – 2017-03-28 (×3): 1 via ORAL
  Filled 2017-03-25 (×3): qty 1

## 2017-03-25 MED ORDER — ALBUTEROL SULFATE (2.5 MG/3ML) 0.083% IN NEBU: 5.0000 mg | INHALATION_SOLUTION | Freq: Once | RESPIRATORY_TRACT | Status: DC

## 2017-03-25 MED ORDER — DEXTROSE 5 % IV SOLN
2.0000 g | INTRAVENOUS | Status: AC
  Administered 2017-03-25: 2 g via INTRAVENOUS
  Filled 2017-03-25: qty 2

## 2017-03-25 MED ORDER — FLUDROCORTISONE ACETATE 0.1 MG PO TABS
0.3000 mg | ORAL_TABLET | Freq: Every day | ORAL | Status: DC
  Administered 2017-03-26: 0.3 mg via ORAL
  Filled 2017-03-25: qty 3

## 2017-03-25 MED ORDER — VANCOMYCIN HCL IN DEXTROSE 1-5 GM/200ML-% IV SOLN
1000.0000 mg | INTRAVENOUS | Status: AC
  Administered 2017-03-25: 1000 mg via INTRAVENOUS
  Filled 2017-03-25: qty 200

## 2017-03-25 MED ORDER — SODIUM CHLORIDE 0.9 % IV BOLUS (SEPSIS)
500.0000 mL | Freq: Once | INTRAVENOUS | Status: AC
  Administered 2017-03-25: 500 mL via INTRAVENOUS

## 2017-03-25 MED ORDER — IPRATROPIUM-ALBUTEROL 0.5-2.5 (3) MG/3ML IN SOLN
3.0000 mL | Freq: Once | RESPIRATORY_TRACT | Status: DC
  Filled 2017-03-25: qty 3

## 2017-03-25 MED ORDER — IOPAMIDOL (ISOVUE-300) INJECTION 61%
INTRAVENOUS | Status: AC
  Filled 2017-03-25: qty 100

## 2017-03-25 MED ORDER — VANCOMYCIN HCL IN DEXTROSE 750-5 MG/150ML-% IV SOLN
750.0000 mg | Freq: Two times a day (BID) | INTRAVENOUS | Status: DC
  Administered 2017-03-26: 750 mg via INTRAVENOUS
  Filled 2017-03-25 (×2): qty 150

## 2017-03-25 MED ORDER — POTASSIUM CHLORIDE CRYS ER 20 MEQ PO TBCR
40.0000 meq | EXTENDED_RELEASE_TABLET | Freq: Once | ORAL | Status: AC
  Administered 2017-03-25: 40 meq via ORAL
  Filled 2017-03-25: qty 2

## 2017-03-25 MED ORDER — POLYVINYL ALCOHOL 1.4 % OP SOLN
1.0000 [drp] | Freq: Four times a day (QID) | OPHTHALMIC | Status: DC | PRN
  Filled 2017-03-25: qty 15

## 2017-03-25 MED ORDER — VITAMIN B-1 100 MG PO TABS
100.0000 mg | ORAL_TABLET | Freq: Every day | ORAL | Status: DC
  Administered 2017-03-26 – 2017-03-28 (×3): 100 mg via ORAL
  Filled 2017-03-25 (×3): qty 1

## 2017-03-25 MED ORDER — GUAIFENESIN 100 MG/5ML PO SOLN
200.0000 mg | ORAL | Status: DC | PRN
  Administered 2017-03-26 – 2017-03-28 (×4): 200 mg via ORAL
  Filled 2017-03-25 (×4): qty 10

## 2017-03-25 MED ORDER — DEXTROSE 5 % IV SOLN
1.0000 g | Freq: Three times a day (TID) | INTRAVENOUS | Status: DC
  Administered 2017-03-26 – 2017-03-27 (×4): 1 g via INTRAVENOUS
  Filled 2017-03-25 (×6): qty 1

## 2017-03-25 MED ORDER — IPRATROPIUM-ALBUTEROL 0.5-2.5 (3) MG/3ML IN SOLN
3.0000 mL | Freq: Three times a day (TID) | RESPIRATORY_TRACT | Status: DC
  Administered 2017-03-26 – 2017-03-28 (×4): 3 mL via RESPIRATORY_TRACT
  Filled 2017-03-25 (×8): qty 3

## 2017-03-25 MED ORDER — LORAZEPAM 1 MG PO TABS: 0.0000 mg | ORAL_TABLET | Freq: Two times a day (BID) | ORAL | Status: DC

## 2017-03-25 MED ORDER — SODIUM CHLORIDE 0.9 % IV BOLUS (SEPSIS)
250.0000 mL | Freq: Once | INTRAVENOUS | Status: AC
  Administered 2017-03-25: 250 mL via INTRAVENOUS

## 2017-03-25 MED ORDER — OXYCODONE HCL 5 MG PO TABS
5.0000 mg | ORAL_TABLET | ORAL | Status: DC | PRN
Start: 2017-03-25 — End: 2017-03-28
  Administered 2017-03-26 – 2017-03-28 (×4): 5 mg via ORAL
  Filled 2017-03-25 (×4): qty 1

## 2017-03-25 MED ORDER — ZOLPIDEM TARTRATE 5 MG PO TABS: 5.0000 mg | ORAL_TABLET | Freq: Every evening | ORAL | Status: DC | PRN

## 2017-03-25 MED ORDER — LORAZEPAM 1 MG PO TABS
0.0000 mg | ORAL_TABLET | Freq: Four times a day (QID) | ORAL | Status: DC
  Administered 2017-03-26: 2 mg via ORAL
  Administered 2017-03-27: 1 mg via ORAL
  Filled 2017-03-25: qty 2
  Filled 2017-03-25: qty 1

## 2017-03-25 MED ORDER — VENLAFAXINE HCL ER 75 MG PO CP24
150.0000 mg | ORAL_CAPSULE | Freq: Every day | ORAL | Status: DC
  Administered 2017-03-26 – 2017-03-28 (×3): 150 mg via ORAL
  Filled 2017-03-25 (×3): qty 2

## 2017-03-25 MED ORDER — IPRATROPIUM-ALBUTEROL 0.5-2.5 (3) MG/3ML IN SOLN
3.0000 mL | RESPIRATORY_TRACT | Status: DC | PRN
  Administered 2017-03-25 – 2017-03-28 (×5): 3 mL via RESPIRATORY_TRACT
  Filled 2017-03-25 (×4): qty 3

## 2017-03-25 MED ORDER — LORAZEPAM 2 MG/ML IJ SOLN: 1.0000 mg | Freq: Four times a day (QID) | INTRAMUSCULAR | Status: DC | PRN

## 2017-03-25 MED ORDER — SODIUM CHLORIDE 0.9 % IV BOLUS (SEPSIS)
1000.0000 mL | Freq: Once | INTRAVENOUS | Status: AC
  Administered 2017-03-25: 1000 mL via INTRAVENOUS

## 2017-03-25 MED ORDER — IOPAMIDOL (ISOVUE-370) INJECTION 76%
INTRAVENOUS | Status: AC
  Filled 2017-03-25: qty 100

## 2017-03-25 MED ORDER — LEVETIRACETAM 500 MG PO TABS
500.0000 mg | ORAL_TABLET | Freq: Two times a day (BID) | ORAL | Status: DC
  Administered 2017-03-26 – 2017-03-28 (×6): 500 mg via ORAL
  Filled 2017-03-25 (×6): qty 1

## 2017-03-25 MED ORDER — NICOTINE 14 MG/24HR TD PT24
14.0000 mg | MEDICATED_PATCH | Freq: Once | TRANSDERMAL | Status: AC
  Administered 2017-03-25: 14 mg via TRANSDERMAL
  Filled 2017-03-25: qty 1

## 2017-03-25 MED ORDER — FOLIC ACID 1 MG PO TABS
1.0000 mg | ORAL_TABLET | Freq: Every day | ORAL | Status: DC
  Administered 2017-03-26 – 2017-03-28 (×3): 1 mg via ORAL
  Filled 2017-03-25 (×3): qty 1

## 2017-03-25 MED ORDER — IOPAMIDOL (ISOVUE-370) INJECTION 76%
100.0000 mL | Freq: Once | INTRAVENOUS | Status: AC | PRN
  Administered 2017-03-25: 100 mL via INTRAVENOUS

## 2017-03-25 MED ORDER — LABETALOL HCL 5 MG/ML IV SOLN
5.0000 mg | INTRAVENOUS | Status: DC | PRN
  Filled 2017-03-25: qty 4

## 2017-03-25 MED ORDER — IBUPROFEN 200 MG PO TABS
400.0000 mg | ORAL_TABLET | Freq: Four times a day (QID) | ORAL | Status: DC | PRN
  Administered 2017-03-28: 400 mg via ORAL
  Filled 2017-03-25: qty 2

## 2017-03-25 MED ORDER — ENOXAPARIN SODIUM 40 MG/0.4ML ~~LOC~~ SOLN
40.0000 mg | SUBCUTANEOUS | Status: DC
  Administered 2017-03-26 – 2017-03-27 (×2): 40 mg via SUBCUTANEOUS
  Filled 2017-03-25 (×3): qty 0.4

## 2017-03-25 MED ORDER — POTASSIUM CHLORIDE 10 MEQ/100ML IV SOLN
10.0000 meq | Freq: Once | INTRAVENOUS | Status: AC
  Administered 2017-03-25: 10 meq via INTRAVENOUS
  Filled 2017-03-25: qty 100

## 2017-03-25 NOTE — ED Notes (Signed)
Bed: WA17 Expected date:  Expected time:  Means of arrival:  Comments: Triage 2

## 2017-03-25 NOTE — ED Notes (Signed)
Will give bedside report to [facility]. RN

## 2017-03-25 NOTE — ED Triage Notes (Signed)
Pt complaint of worsening productive cough and SOB since Friday; pt hx of lung cancer.

## 2017-03-25 NOTE — ED Notes (Signed)
Pt has just used the bedpan and a sample was not collected but informed a urine sample would be needed when she could be go again.

## 2017-03-25 NOTE — ED Notes (Signed)
Pt in CT.

## 2017-03-25 NOTE — ED Provider Notes (Signed)
[facility] DEPT Provider Note   CSN: [id number] Arrival date & time: 03/25/17  1539     History   Chief Complaint Chief Complaint  Patient presents with  . Cough  . Shortness of Breath    HPI Erika Hunter is a 46 y.o. female.  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 States that over the past 4 days her productive cough has progressively worsened. Endorses thick yellow sputum. Also endorses worsening shortness of breath especially with exertion. States she was diagnosed with lung cancer back in February 2018 received radiation. Currently on hospice and states that the hospice nurse came out yesterday for patient's symptoms. Patient did not want to come to the ER at that time. They started her on Levaquin and prednisone. Despite the medications patient's symptoms continued to worsen as she presents to the emergency department today for evaluation. Reviewed hospice nurse note states the patient was dyspneic with any speech or movement and was satting in 7% on room air. Patient states that she is a chronic tobacco user.patient denies any history of DVT or PE, recent hospitalizations/surgeries, prolonged immobilizations, lower extremity edema or calf tenderness, hormone use. Patient denies any fever, chills, headache, vision changes, lightheadedness, abdominal pain, nausea, emesis, urinary symptoms, change in bowel habits.   Past Medical History:  Diagnosis Date  . Alcohol dependence ([place]) 01/24/2017  . Anxiety    panic disorder  . Asthma   . Brain cancer ([place])   . Cervical dysplasia 11/19/1988   S/P conization  . COPD (chronic obstructive pulmonary disease) ([place]) 01/24/2017  . Depression   . Hyperlipidemia   . Lung  cancer ([place])   . Steatohepatitis    h/o heavy alcohol use, hepatitis profile negative for A, B    Patient Active Problem List   Diagnosis Date Noted  . HCAP (healthcare-associated pneumonia) 03/25/2017  . COPD with acute exacerbation ([place]) 03/25/2017  . Hypertensive urgency 03/25/2017  . Hypokalemia   . Orthostatic hypotension 01/26/2017  . Goals of care, counseling/discussion   . Palliative care by specialist   . Alcohol dependence ([place]) 01/24/2017  . Headache 01/24/2017  . COPD (chronic obstructive pulmonary disease) ([place]) 01/24/2017  . Brain metastasis ([place]) 01/23/2017  . Small cell lung cancer, left ([place]) 01/22/2017  . Lung mass   . Depression 09/11/2016  . History of abnormal cervical Pap smear 09/11/2016  . Hyperlipidemia with target low density lipoprotein (LDL) cholesterol less than 100 mg/dL 04/17/2012  . Generalized anxiety disorder 04/17/2012  . Tobacco user 04/17/2012    Past Surgical History:  Procedure Laterality Date  . Cold knife conization  Early 2000s   Pre-cancerous lesions  . DILATION AND CURETTAGE OF UTERUS    . LUNG BIOPSY Left 01/21/2017   Procedure: LUNG BIOPSY LEFT UPPER LOBE;  Surgeon: Grace Isaac, MD;  Location: [facility];  Service: Thoracic;  Laterality: Left;  Marland Kitchen VIDEO BRONCHOSCOPY N/A 01/21/2017   Procedure: VIDEO BRONCHOSCOPY;  Surgeon: Grace Isaac, MD;  [facility];  Service: Thoracic;  Laterality: N/A;    OB History    Gravida Para Term Preterm AB Living   '1       1 2   '$ SAB TAB Ectopic Multiple Live Births  Home Medications    Prior to Admission medications   Medication Sig Start Date End Date Taking? Authorizing Provider  albuterol (PROAIR HFA) 108 (90 Base) MCG/ACT inhaler INHALE 2 PUFFS INTO LUNGS EVERY 4 HOURS AS NEEDED FOR COUGH, SHORTNESS OF BREATH, OR WHEEZING 12/21/16  Yes Wardell Honour, MD  fludrocortisone (FLORINEF) 0.1 MG tablet Take 3 tablets (0.3 mg total) by mouth daily. 01/31/17  Yes Tat, Shanon Brow,  MD  guaifenesin (ROBITUSSIN) 100 MG/5ML syrup Give 10 cc by mouth every 6 hours as needed for cough   Yes [provider]  ibuprofen (ADVIL,MOTRIN) 200 MG tablet Take 400 mg by mouth every 6 (six) hours as needed for mild pain.   Yes [provider]  ipratropium-albuterol (DUONEB) 0.5-2.5 (3) MG/3ML SOLN Take 3 mLs by nebulization every 4 (four) hours as needed (SOB and Dyspnea).   Yes [provider]  levETIRAcetam (KEPPRA) 500 MG tablet Take 1 tablet (500 mg total) by mouth 2 (two) times daily. 01/31/17  Yes Tat, Shanon Brow, MD  levofloxacin (LEVAQUIN) 500 MG tablet Take 500 mg by mouth daily.   Yes [provider]  midodrine (PROAMATINE) 10 MG tablet Take 1 tablet (10 mg total) by mouth 3 (three) times daily with meals. 01/31/17  Yes Tat, Shanon Brow, MD  oxyCODONE (OXY IR/ROXICODONE) 5 MG immediate release tablet Take 1 tablet (5 mg total) by mouth every 4 (four) hours as needed for moderate pain. 01/31/17  Yes Tat, Shanon Brow, MD  polyvinyl alcohol (ARTIFICIAL TEARS) 1.4 % ophthalmic solution Place 1 drop into both eyes every 6 (six) hours as needed for dry eyes.    Yes [provider]  predniSONE (DELTASONE) 20 MG tablet Take 40 mg by mouth daily with breakfast.   Yes [provider]  venlafaxine XR (EFFEXOR-XR) 150 MG 24 hr capsule TAKE 1 CAPSULE BY MOUTH ONCE DAILY ALONG WITH '75MG'$  CAPSULE 09/11/16  Yes Jeffery, Chelle, PA-C  zolpidem (AMBIEN CR) 12.5 MG CR tablet Take 1 tablet (12.5 mg total) by mouth at bedtime as needed for sleep. 03/08/17  Yes Truitt Merle, MD  acetaminophen (TYLENOL) 325 MG tablet Take 2 tablets (650 mg total) by mouth every 6 (six) hours as needed for mild pain (or Fever >/= 101). Patient not taking: Reported on 03/21/2017 01/31/17   [name], MD  oxyCODONE-acetaminophen (PERCOCET/ROXICET) 5-325 MG tablet Take 0.5-1 tablets by mouth every 6 (six) hours as needed for severe pain. Patient not taking: Reported on 03/21/2017 02/20/17   Quintella Reichert, MD  venlafaxine XR (EFFEXOR-XR) 75 MG 24 hr capsule TAKE 1 CAPSULE BY MOUTH ONCE DAILY ALONG WITH '150MG'$  CAPSULE Patient not taking: Reported on 03/21/2017 09/11/16   Harrison Mons, PA-C    Family History Family History  Problem Relation Age of Onset  . COPD Mother   . COPD Sister   . Cancer Sister 30    ovarian  . Diabetes Sister   . Mental illness Sister     Social History Social History  Substance Use Topics  . Smoking status: Current Every Day Smoker    Packs/day: 1.00    Years: 25.00  . Smokeless tobacco: Never Used  . Alcohol use Yes     Comment: wine - few glasses/day     Allergies   Patient has no known allergies.   Review of Systems Review of Systems  Constitutional: Negative for chills and fever.  HENT: Negative for congestion.   Respiratory: Positive for cough and shortness of breath.   Cardiovascular: Negative for  chest pain, palpitations and leg swelling.  Gastrointestinal: Negative for abdominal pain, diarrhea, nausea and vomiting.  Genitourinary: Negative for dysuria, flank pain and frequency.  Musculoskeletal: Negative for back pain.  Skin: Negative.   Neurological: Negative for dizziness, syncope, facial asymmetry, weakness, light-headedness and headaches.     Physical Exam Updated Vital Signs BP 126/90   Pulse (!) 110   Temp 98.5 F (36.9 C) (Oral)   Resp (!) 32   Ht '5\' 5"'$  (1.651 m)   Wt 56 kg   LMP 05/01/2012   SpO2 97%   BMI 20.54 kg/m   Physical Exam  Constitutional: She is oriented to person, place, and time. She appears well-developed and well-nourished. No distress.  Patient is chronically ill-appearing.   HENT:  Head: Normocephalic and atraumatic.  Mouth/Throat: Oropharynx is clear and moist.  Eyes: Conjunctivae are normal. Right eye exhibits no discharge. Left eye exhibits no discharge. No scleral icterus.  Neck: Normal range of motion. Neck supple. No JVD present. No thyromegaly present.  Cardiovascular: Regular  rhythm, normal heart sounds and intact distal pulses.   Tachycardia noted  Pulmonary/Chest: Effort normal. No respiratory distress. She has wheezes. She has rales. She exhibits no tenderness.  Patient with diminished breath sounds bilaterally. She does have coarse sounds noted at the bases of the lungs bilaterally. Increased work of breathing with mild tachypnea and dyspnea with speech.  Abdominal: Soft. Bowel sounds are normal. She exhibits no distension. There is no tenderness. There is no rebound and no guarding.  Musculoskeletal: Normal range of motion. She exhibits no edema.  Lymphadenopathy:    She has no cervical adenopathy.  Neurological: She is alert and oriented to person, place, and time.  Skin: Skin is warm and dry. Capillary refill takes less than 2 seconds.  Mottling about the bilateral knees  Nursing note and vitals reviewed.    ED Treatments / Results  Labs (all labs ordered are listed, but only abnormal results are displayed) Labs Reviewed  COMPREHENSIVE METABOLIC PANEL - Abnormal; Notable for the following:       Result Value   Potassium 2.6 (*)    Chloride 98 (*)    Glucose, Bld 115 (*)    BUN 21 (*)    Albumin 2.7 (*)    AST 13 (*)    All other components within normal limits  CBC WITH DIFFERENTIAL/PLATELET - Abnormal; Notable for the following:    RBC 3.76 (*)    Hemoglobin 11.8 (*)    HCT 34.9 (*)    Lymphs Abs 0.2 (*)    All other components within normal limits  URINALYSIS, ROUTINE W REFLEX MICROSCOPIC - Abnormal; Notable for the following:    Specific Gravity, Urine >1.046 (*)    Hgb urine dipstick MODERATE (*)    Protein, ur 100 (*)    Squamous Epithelial / LPF 6-30 (*)    All other components within normal limits  I-STAT CG4 LACTIC ACID, ED - Abnormal; Notable for the following:    Lactic Acid, Venous 2.56 (*)    All other components within normal limits  CULTURE, BLOOD (ROUTINE X 2)  CULTURE, BLOOD (ROUTINE X 2)  CULTURE, EXPECTORATED  SPUTUM-ASSESSMENT  GRAM STAIN  MAGNESIUM  TROPONIN I  HIV ANTIBODY (ROUTINE TESTING)  STREP PNEUMONIAE URINARY ANTIGEN  I-STAT CG4 LACTIC ACID, ED    EKG  EKG Interpretation None       Radiology Dg Chest 2 View  Result Date: 03/25/2017 CLINICAL DATA:  Worsening productive cough and  shortness of breath. History of lung cancer EXAM: CHEST  2 VIEW COMPARISON:  01/21/2017 FINDINGS: Left upper lobe mass has decreased in the interval. There is bibasilar airspace disease, right greater than left, suspicious for pneumonia. The cardio pericardial silhouette is enlarged. Hyperexpansion suggests emphysema. Probable infarct noted left proximal humerus, stable. Telemetry leads overlie the chest. IMPRESSION: Interval decrease in left upper lobe mass. Emphysema with patchy airspace disease in the lower lobes bilaterally suggesting pneumonia. Electronically Signed   By: Misty Stanley M.D.   On: 03/25/2017 16:56   Ct Angio Chest Pe W/cm &/or Wo Cm  Result Date: 03/25/2017 CLINICAL DATA:  Acute onset of productive cough and shortness of breath. Current history of lung cancer. Initial encounter. EXAM: CT ANGIOGRAPHY CHEST WITH CONTRAST TECHNIQUE: Multidetector CT imaging of the chest was performed using the standard protocol during bolus administration of intravenous contrast. Multiplanar CT image reconstructions and MIPs were obtained to evaluate the vascular anatomy. CONTRAST:  100 mL of Isovue 370 IV contrast COMPARISON:  CT of the chest performed 01/19/2017, and chest radiograph performed earlier today at 4:46 p.m. FINDINGS: Cardiovascular: The heart is normal in size. The thoracic aorta is grossly unremarkable. The great vessels are unremarkable in appearance. Mediastinum/Nodes: The patient's left upper lobe mass has decreased in size, now measuring approximately 8.5 x 4.1 x 2.7 cm. This extends to the left hilum, and as before, mass extends into the left upper pulmonary vein. This partially encases the left  main pulmonary artery. No mediastinal lymphadenopathy is seen. No pericardial effusion is identified. The visualized portions of the thyroid gland are unremarkable. No axillary lymphadenopathy is appreciated. Lungs/Pleura: There are new patchy bilateral peripheral airspace opacities throughout both lungs, most prominent at the right upper lobe, concerning for multifocal pneumonia. No pleural effusion or pneumothorax is seen. No additional masses are identified. Underlying emphysema is noted. Upper Abdomen: The visualized portions of the liver and spleen are grossly unremarkable. The visualized portions of the adrenal glands and kidneys are within normal limits. A tiny hiatal hernia is noted. Musculoskeletal: No acute osseous abnormalities are identified. There is mild chronic loss of height at the superior endplate of T9. The visualized musculature is unremarkable in appearance. Review of the MIP images confirms the above findings. IMPRESSION: 1. New patchy bilateral peripheral airspace opacities throughout both lungs, most prominent at the right upper lobe, concerning for multifocal pneumonia. 2. Left upper lobe lung mass has decreased in size, now measuring approximately 8.5 x 4.1 x 2.7 cm. This extends into the left hilum, and as before, into the left upper pulmonary vein. There is partial encasement of the left main pulmonary artery. 3. Underlying bilateral emphysema noted. 4. Tiny hiatal hernia seen. 5. Mild chronic loss of height at the superior endplate of T9. Electronically Signed   By: Garald Balding M.D.   On: 03/25/2017 19:29    Procedures Procedures (including critical care time)  Medications Ordered in ED Medications  iopamidol (ISOVUE-300) 61 % injection (not administered)  iopamidol (ISOVUE-370) 76 % injection (not administered)  nicotine (NICODERM CQ - dosed in mg/24 hours) patch 14 mg (14 mg Transdermal Patch Applied 03/25/17 2009)  vancomycin (VANCOCIN) IVPB 1000 mg/200 mL premix (1,000 mg  Intravenous New Bag/Given 03/25/17 2158)  ceFEPIme (MAXIPIME) 1 g in dextrose 5 % 50 mL IVPB (not administered)  vancomycin (VANCOCIN) IVPB 750 mg/150 ml premix (not administered)  ipratropium-albuterol (DUONEB) 0.5-2.5 (3) MG/3ML nebulizer solution 3 mL (not administered)  zolpidem (AMBIEN) tablet 5 mg (not administered)  guaiFENesin (ROBITUSSIN) 100 MG/5ML solution 200 mg (not administered)  levETIRAcetam (KEPPRA) tablet 500 mg (not administered)  oxyCODONE (Oxy IR/ROXICODONE) immediate release tablet 5 mg (not administered)  polyvinyl alcohol (LIQUIFILM TEARS) 1.4 % ophthalmic solution 1 drop (not administered)  venlafaxine XR (EFFEXOR-XR) 24 hr capsule 150 mg (not administered)  ibuprofen (ADVIL,MOTRIN) tablet 400 mg (not administered)  fludrocortisone (FLORINEF) tablet 0.3 mg (not administered)  enoxaparin (LOVENOX) injection 40 mg (not administered)  0.9 %  sodium chloride infusion (not administered)  labetalol (NORMODYNE,TRANDATE) injection 5-10 mg (not administered)  LORazepam (ATIVAN) tablet 1 mg (not administered)    Or  LORazepam (ATIVAN) injection 1 mg (not administered)  thiamine (VITAMIN B-1) tablet 100 mg (not administered)    Or  thiamine (B-1) injection 100 mg (not administered)  folic acid (FOLVITE) tablet 1 mg (not administered)  multivitamin with minerals tablet 1 tablet (not administered)  LORazepam (ATIVAN) tablet 0-4 mg (not administered)    Followed by  LORazepam (ATIVAN) tablet 0-4 mg (not administered)  potassium chloride 10 mEq in 100 mL IVPB (0 mEq Intravenous Stopped 03/25/17 1935)  potassium chloride SA (K-DUR,KLOR-CON) CR tablet 40 mEq (40 mEq Oral Given 03/25/17 1807)  sodium chloride 0.9 % bolus 1,000 mL (0 mLs Intravenous Stopped 03/25/17 2048)  iopamidol (ISOVUE-370) 76 % injection 100 mL (100 mLs Intravenous Contrast Given 03/25/17 1857)  sodium chloride 0.9 % bolus 500 mL (0 mLs Intravenous Stopped 03/25/17 2049)    And  sodium chloride 0.9 % bolus 250 mL (0  mLs Intravenous Stopped 03/25/17 2103)  ceFEPIme (MAXIPIME) 2 g in dextrose 5 % 50 mL IVPB (0 g Intravenous Stopped 03/25/17 2152)     Initial Impression / Assessment and Plan / ED Course  I have reviewed the triage vital signs and the nursing notes.  Pertinent labs & imaging results that were available during my care of the patient were reviewed by me and considered in my medical decision making (see chart for details).    The patient resents to the ED with complaints of worsening dyspnea and productive cough. Patient does have history of small cell lung cancer that is currently receiving radiation chemotherapy. Patient evaluated by hospice nurse yesterday and placed on Levaquin and prednisone the symptoms progressively worsened. Patient is afebrile in the ED. S has noted to be tachycardic, with saturations in the low 90s on room air. Blood pressure is normal.labs were obtained. No leukocytosis noted. Hemoglobin stable at 11.8. UA shows no signs of infection. The patient does have a potassium of 2.6 and was first placed with IV and oral potassium. Initial lactic acid was normal.chest x-ray shows concern for bibasilar pneumonia Patient then became tychapenic along with a tachycardia.repeat lactic acid was noted to be 2.5. Sepsis protocol was initiated. Patient was given a 30 mL per KG fluid bolus. She was started on empiric vancomycin and cefepime per pharmacy recommendations. Blood cultures were obtained prior to antibiotic initiation. She was given DuoNeb. Given patient's history of cancer was concerned for possible PE given the tachycardia and tachypnea with low oxygen saturations. CT PE study revealed no PE. Does have multifocal pneumonia.EKG shows sinus tachycardia with no signs of ischemic changes. Troponin was negative.consult with hospital medicine for admission. Spoke with Dr. Myna Hidalgo who agrees to come to the ED to evaluate patient and plans admission orders. Patient currently remains seen with stable  in no acute distress. She was updated on plan of care.   Patient requesting to go to the parking lot to smoke.  States that is not possible. Given her nicotine patch.  Patient discussed with Dr. Leonette Monarch who is agreeable to above plan.   Final Clinical Impressions(s) / ED Diagnoses   Final diagnoses:  Community acquired pneumonia, unspecified laterality  Hypokalemia  Sepsis, due to unspecified organism [facility])    New Prescriptions Current Discharge Medication List       Aaron Edelman 03/25/17 2255    Fatima Blank, MD 03/26/17 0110

## 2017-03-25 NOTE — H&P (Signed)
History and Physical    Erika Hunter HGD:[id number] DOB: 04-May-1971 DOA: 03/25/2017  PCP: Wardell Honour, MD   Patient coming from: Home  Chief Complaint: SOB, productive cough  HPI: Erika Hunter is a 46 y.o. female with medical history significant for alcohol dependence, tobacco abuse, depression with anxiety, and small cell lung cancer with brain metastases, now presenting to the emergency department for evaluation of shortness of breath and productive cough for the past 4 days. Patient reports that she had been in her usual state of health until approximately 4 days ago when she noted the insidious development of worsening shortness of breath and cough productive of thick yellow sputum. She denies any fevers or chills associated with this, and denies any chest pain or palpitations. There has not been any lower extremity swelling or tenderness. Since time of onset, the symptoms have progressively worsened and she was evaluated at home by a hospice nurse 2 days ago, advised to go to the ER, declined ER visit, and was prescribed Levaquin and prednisone over the phone. Despite these medications, patient has continued to worsen and comes in for evaluation. Per report of hospice nurse, the patient was dyspneic with any speech or movement and was saturating in the 70s on room air.   ED Course: Upon arrival to the ED, patient is found to be afebrile, saturating low 90s on room air, tachypneic in the low 30s, tachycardic in the 110s, and with blood pressure 140/100. EKG features a sinus tachycardia with rate 122 and low voltage QRS. Chest x-ray is notable for emphysema with patchy bibasilar airspace disease concerning for pneumonia. Chemistry panels notable for potassium of 2.6 and marked elevation and BUN to creatinine ratio. CBC is notable for mild normocytic anemia and mild left shift. Troponin is undetectable, initial lactic acid is normal, and second lactic acid returns elevated to 2.56. Patient was  given 30 cc/kg normal saline and started on empiric vancomycin and cefepime. She was treated with DuoNeb, 10 mEq IV potassium, and 40 mEq oral potassium. She remained slightly tachycardic and dyspneic with speech or movement. She will be admitted to the telemetry unit for ongoing evaluation and management of HCAP.   Review of Systems:  All other systems reviewed and apart from HPI, are negative.  Past Medical History:  Diagnosis Date  . Alcohol dependence ([place]) 01/24/2017  . Anxiety    panic disorder  . Asthma   . Brain cancer ([place])   . Cervical dysplasia 11/19/1988   S/P conization  . COPD (chronic obstructive pulmonary disease) ([place]) 01/24/2017  . Depression   . Hyperlipidemia   . Lung cancer ([place])   . Steatohepatitis    h/o heavy alcohol use, hepatitis profile negative for A, B    Past Surgical History:  Procedure Laterality Date  . Cold knife conization  Early 2000s   Pre-cancerous lesions  . DILATION AND CURETTAGE OF UTERUS    . LUNG BIOPSY Left 01/21/2017   Procedure: LUNG BIOPSY LEFT UPPER LOBE;  Surgeon: Grace Isaac, MD;  Location: [facility];  Service: Thoracic;  Laterality: Left;  Marland Kitchen VIDEO BRONCHOSCOPY N/A 01/21/2017   Procedure: VIDEO BRONCHOSCOPY;  Surgeon: Grace Isaac, MD;  Location: [facility];  Service: Thoracic;  Laterality: N/A;     reports that she has been smoking.  She has a 25.00 pack-year smoking history. She has never used smokeless tobacco. She reports that she drinks alcohol. She reports that she does not use drugs.  No Known  Allergies  Family History  Problem Relation Age of Onset  . COPD Mother   . COPD Sister   . Cancer Sister 30    ovarian  . Diabetes Sister   . Mental illness Sister      Prior to Admission medications   Medication Sig Start Date End Date Taking? Authorizing Provider  albuterol (PROAIR HFA) 108 (90 Base) MCG/ACT inhaler INHALE 2 PUFFS INTO LUNGS EVERY 4 HOURS AS NEEDED FOR COUGH, SHORTNESS OF BREATH, OR WHEEZING 12/21/16  Yes  Wardell Honour, MD  fludrocortisone (FLORINEF) 0.1 MG tablet Take 3 tablets (0.3 mg total) by mouth daily. 01/31/17  Yes Tat, Shanon Brow, MD  guaifenesin (ROBITUSSIN) 100 MG/5ML syrup Give 10 cc by mouth every 6 hours as needed for cough   Yes [provider]  ibuprofen (ADVIL,MOTRIN) 200 MG tablet Take 400 mg by mouth every 6 (six) hours as needed for mild pain.   Yes [provider]  ipratropium-albuterol (DUONEB) 0.5-2.5 (3) MG/3ML SOLN Take 3 mLs by nebulization every 4 (four) hours as needed (SOB and Dyspnea).   Yes [provider]  levETIRAcetam (KEPPRA) 500 MG tablet Take 1 tablet (500 mg total) by mouth 2 (two) times daily. 01/31/17  Yes Tat, Shanon Brow, MD  levofloxacin (LEVAQUIN) 500 MG tablet Take 500 mg by mouth daily.   Yes [provider]  midodrine (PROAMATINE) 10 MG tablet Take 1 tablet (10 mg total) by mouth 3 (three) times daily with meals. 01/31/17  Yes Tat, Shanon Brow, MD  oxyCODONE (OXY IR/ROXICODONE) 5 MG immediate release tablet Take 1 tablet (5 mg total) by mouth every 4 (four) hours as needed for moderate pain. 01/31/17  Yes Tat, Shanon Brow, MD  polyvinyl alcohol (ARTIFICIAL TEARS) 1.4 % ophthalmic solution Place 1 drop into both eyes every 6 (six) hours as needed for dry eyes.    Yes [provider]  predniSONE (DELTASONE) 20 MG tablet Take 40 mg by mouth daily with breakfast.   Yes [provider]  venlafaxine XR (EFFEXOR-XR) 150 MG 24 hr capsule TAKE 1 CAPSULE BY MOUTH ONCE DAILY ALONG WITH '75MG'$  CAPSULE 09/11/16  Yes Jeffery, Chelle, PA-C  zolpidem (AMBIEN CR) 12.5 MG CR tablet Take 1 tablet (12.5 mg total) by mouth at bedtime as needed for sleep. 03/08/17  Yes Truitt Merle, MD  acetaminophen (TYLENOL) 325 MG tablet Take 2 tablets (650 mg total) by mouth every 6 (six) hours as needed for mild pain (or Fever >/= 101). Patient not taking: Reported on 03/21/2017 01/31/17   [name], MD  oxyCODONE-acetaminophen (PERCOCET/ROXICET) 5-325 MG tablet Take  0.5-1 tablets by mouth every 6 (six) hours as needed for severe pain. Patient not taking: Reported on 03/21/2017 02/20/17   Quintella Reichert, MD  venlafaxine XR (EFFEXOR-XR) 75 MG 24 hr capsule TAKE 1 CAPSULE BY MOUTH ONCE DAILY ALONG WITH '150MG'$  CAPSULE Patient not taking: Reported on 03/21/2017 09/11/16   Harrison Mons, PA-C    Physical Exam: Vitals:   03/25/17 1935 03/25/17 2000 03/25/17 2049 03/25/17 2122  BP: (!) 140/102  (!) 147/97 (!) 146/96  Pulse: (!) 111  100 (!) 104  Resp: (!) 30  (!) 25 (!) 32  Temp:      TempSrc:      SpO2: 94%  92% 90%  Weight:  54.4 kg (120 lb)    Height:  '5\' 5"'$  (1.651 m)        Constitutional: Chronically-ill appearing, dyspnea with speech, no diaphoresis or cyanosis.  Eyes: PERTLA, lids and conjunctivae normal  ENMT: Mucous membranes are moist. Posterior pharynx clear of any exudate or lesions.   Neck: normal, supple, no masses, no thyromegaly Respiratory: Diminished bilaterally with coarse rhonchi at bilateral bases and mid-lung zones. Increased WOB. Mild tachypnea.   Cardiovascular: Rate ~120 and regular. No extremity edema. No significant JVD. Abdomen: No distension, no tenderness, no masses palpated. Bowel sounds normal.  Musculoskeletal: no clubbing / cyanosis. No joint deformity upper and lower extremities.  Skin: Mottling about the bilateral knees. Skin is otherwise warm, dry, well-perfused. Neurologic: CN 2-12 grossly intact. Sensation intact, DTR normal. Strength 5/5 in all 4 limbs.  Psychiatric: Alert and oriented x 3. Pleasant and cooperative.     Labs on Admission: I have personally reviewed following labs and imaging studies  CBC:  Recent Labs Lab 03/25/17 1636  WBC 7.1  NEUTROABS 6.6  HGB 11.8*  HCT 34.9*  MCV 92.8  PLT 030   Basic Metabolic Panel:  Recent Labs Lab 03/25/17 1636 03/25/17 1731  NA 138  --   K 2.6*  --   CL 98*  --   CO2 29  --   GLUCOSE 115*  --   BUN 21*  --   CREATININE 0.47  --   CALCIUM 9.3  --    MG  --  2.0   GFR: Estimated Creatinine Clearance: 74.7 mL/min (by C-G formula based on SCr of 0.47 mg/dL). Liver Function Tests:  Recent Labs Lab 03/25/17 1636  AST 13*  ALT 21  ALKPHOS 69  BILITOT 0.5  PROT 6.9  ALBUMIN 2.7*   No results for input(s): LIPASE, AMYLASE in the last 168 hours. No results for input(s): AMMONIA in the last 168 hours. Coagulation Profile: No results for input(s): INR, PROTIME in the last 168 hours. Cardiac Enzymes:  Recent Labs Lab 03/25/17 1636  TROPONINI <0.03   BNP (last 3 results) No results for input(s): PROBNP in the last 8760 hours. HbA1C: No results for input(s): HGBA1C in the last 72 hours. CBG: No results for input(s): GLUCAP in the last 168 hours. Lipid Profile: No results for input(s): CHOL, HDL, LDLCALC, TRIG, CHOLHDL, LDLDIRECT in the last 72 hours. Thyroid Function Tests: No results for input(s): TSH, T4TOTAL, FREET4, T3FREE, THYROIDAB in the last 72 hours. Anemia Panel: No results for input(s): VITAMINB12, FOLATE, FERRITIN, TIBC, IRON, RETICCTPCT in the last 72 hours. Urine analysis:    Component Value Date/Time   COLORURINE YELLOW 01/20/2017 1558   APPEARANCEUR CLOUDY (A) 01/20/2017 1558   LABSPEC 1.019 01/20/2017 1558   PHURINE 7.0 01/20/2017 1558   GLUCOSEU NEGATIVE 01/20/2017 1558   HGBUR NEGATIVE 01/20/2017 1558   BILIRUBINUR NEGATIVE 01/20/2017 1558   BILIRUBINUR neg 08/24/2014 1703   KETONESUR NEGATIVE 01/20/2017 1558   PROTEINUR NEGATIVE 01/20/2017 1558   UROBILINOGEN 0.2 08/24/2014 1703   NITRITE NEGATIVE 01/20/2017 1558   LEUKOCYTESUR NEGATIVE 01/20/2017 1558   Sepsis Labs: '@LABRCNTIP'$ (procalcitonin:4,lacticidven:4) )No results found for this or any previous visit (from the past 240 hour(s)).   Radiological Exams on Admission: Dg Chest 2 View  Result Date: 03/25/2017 CLINICAL DATA:  Worsening productive cough and shortness of breath. History of lung cancer EXAM: CHEST  2 VIEW COMPARISON:  01/21/2017  FINDINGS: Left upper lobe mass has decreased in the interval. There is bibasilar airspace disease, right greater than left, suspicious for pneumonia. The cardio pericardial silhouette is enlarged. Hyperexpansion suggests emphysema. Probable infarct noted left proximal humerus, stable. Telemetry leads overlie the chest. IMPRESSION: Interval decrease in left upper lobe mass. Emphysema with patchy  airspace disease in the lower lobes bilaterally suggesting pneumonia. Electronically Signed   By: Misty Stanley M.D.   On: 03/25/2017 16:56   Ct Angio Chest Pe W/cm &/or Wo Cm  Result Date: 03/25/2017 CLINICAL DATA:  Acute onset of productive cough and shortness of breath. Current history of lung cancer. Initial encounter. EXAM: CT ANGIOGRAPHY CHEST WITH CONTRAST TECHNIQUE: Multidetector CT imaging of the chest was performed using the standard protocol during bolus administration of intravenous contrast. Multiplanar CT image reconstructions and MIPs were obtained to evaluate the vascular anatomy. CONTRAST:  100 mL of Isovue 370 IV contrast COMPARISON:  CT of the chest performed 01/19/2017, and chest radiograph performed earlier today at 4:46 p.m. FINDINGS: Cardiovascular: The heart is normal in size. The thoracic aorta is grossly unremarkable. The great vessels are unremarkable in appearance. Mediastinum/Nodes: The patient's left upper lobe mass has decreased in size, now measuring approximately 8.5 x 4.1 x 2.7 cm. This extends to the left hilum, and as before, mass extends into the left upper pulmonary vein. This partially encases the left main pulmonary artery. No mediastinal lymphadenopathy is seen. No pericardial effusion is identified. The visualized portions of the thyroid gland are unremarkable. No axillary lymphadenopathy is appreciated. Lungs/Pleura: There are new patchy bilateral peripheral airspace opacities throughout both lungs, most prominent at the right upper lobe, concerning for multifocal pneumonia. No  pleural effusion or pneumothorax is seen. No additional masses are identified. Underlying emphysema is noted. Upper Abdomen: The visualized portions of the liver and spleen are grossly unremarkable. The visualized portions of the adrenal glands and kidneys are within normal limits. A tiny hiatal hernia is noted. Musculoskeletal: No acute osseous abnormalities are identified. There is mild chronic loss of height at the superior endplate of T9. The visualized musculature is unremarkable in appearance. Review of the MIP images confirms the above findings. IMPRESSION: 1. New patchy bilateral peripheral airspace opacities throughout both lungs, most prominent at the right upper lobe, concerning for multifocal pneumonia. 2. Left upper lobe lung mass has decreased in size, now measuring approximately 8.5 x 4.1 x 2.7 cm. This extends into the left hilum, and as before, into the left upper pulmonary vein. There is partial encasement of the left main pulmonary artery. 3. Underlying bilateral emphysema noted. 4. Tiny hiatal hernia seen. 5. Mild chronic loss of height at the superior endplate of T9. Electronically Signed   By: Garald Balding M.D.   On: 03/25/2017 19:29    EKG: Independently reviewed. Sinus tachycardia (rate 122), low-voltage QRS.   Assessment/Plan  1. HCAP  - Pt presents with 4 days of progressive SOB and productive cough, she is symptomatic at rest  - Afebrile and no leukocytosis, but chest imaging highly suggestive of PNA; PE excluded with CTA chest  - She was started on empiric vancomycin and cefepime in ED - Check sputum culture and strep pneumo antigen; continue empiric therapy with vancomycin and cefepime for now - Supportive care with supplemental O2, nebs, Robitussin   2. Small cell lung cancer, metastatic  - Pt has SCLC involving LUL with brain metastases  - She is followed by oncology and has been treated with radiation, but declined the palliative chemotherapy that was recommended by  oncologist  - She is under the care of home hospice but wants to be full code, confirms that she wants to be intubated if necessary to prolong her life  - Palliative care consultation requested    3. Hypertensive urgency  - Diastolic pressures sustaining in  100-range  - Treat with prn labetalol IVP's for now   4. Depression, anxiety  - Appears stable  - Continue Effexor   5. Alcohol dependence  - No withdrawal s/s on admission  - Monitor with CIWA and prn Ativan   6. Current smoker  - Nicotine patch provided at pt's request    7. Hypokalemia  - Serum potassium is 2.6 on admission - Albuterol nebs likely contributing  - She was given 10 mEq IV potassium and 40 mEq oral in ED  - Magnesium level wnl  - Continue telemetry monitoring and repeat chem panel in am     DVT prophylaxis: sq Lovenox Code Status: Full  Family Communication: Friends updated at bedside at pt's request Disposition Plan: Admit to telemetry Consults called: None Admission status: Inpatient    Vianne Bulls, MD [facility] Pager [phone]  If 7PM-7AM, please contact night-coverage [email or web address] Password [facility]  03/25/2017, 9:31 PM

## 2017-03-25 NOTE — ED Notes (Signed)
Writer notified EDP [facility] of abnormal I-stat lactic result.

## 2017-03-25 NOTE — Progress Notes (Signed)
[facility] ED- Triage 2- [facility]- ED RN Visit  This patient is active with [facility] hospice services as of 02/21/17 for diagnosis of lung cancer.  Patient is a FULL CODE.  Patient was seen in the home by an on-call hospice RN yesterday, for c/o shortness of breath.  Per chart review, patient was dyspneic and continued to smoke during visit.  On call, MD, Dr. Marin Olp was notified and ED evaluation was suggested and patient refused.  She was ordered Levaquin '500mg'$  po qd x7 days, Prednisone '40mg'$  po qd x5 days, Duoneb 0.5/2.5. Her Guaifenesin was also reordered and RX for Oxycodone was refilled by hospice MD.  Upon assessment from hospice RN today, patient continued with dyspnea and mottling was noted to extremities.  O2 sat was 71% on RA and patient refused oxygen.  Her roommate decided to bring her in to the ED for further evaluation.  Visited patient in room.  She is having difficulty engaging in conversation d/t shortness of breath.  She is currently awaiting further work-up.  Patient stated she does not want to be hospitalized.  Updated [facility] medication list provided to ED med tech.  Patient denies any hospice-related questions or concerns.  Left contact information if any needs arise.  [facility] will continue to follow.  Thank you,  Freddi Starr RN, [facility] Liaison [phone]

## 2017-03-25 NOTE — ED Notes (Signed)
Pt does not feel able to void at this time. Said she will let staff know when able.

## 2017-03-25 NOTE — Progress Notes (Signed)
Pharmacy Antibiotic Note  Erika Hunter is a 46 y.o. female admitted on 03/25/2017 with pneumonia.  PMH significant for lung cancer and recent 7 day course of Levaquin started on 5/6.  Brought to ED today for evaluation.  Elevated lactic acid.  Code Sepsis called.  Pharmacy has been consulted for Vancomycin and Cefepime dosing.  Plan:  Cefepime 2gm IV x 1 dose followed by 1gm IV q8h  Vancomycin 1gm IV x 1 dose followed by '750mg'$  IV q12h  Follow cultures/sensitivities  Height: '5\' 5"'$  (165.1 cm) Weight: 120 lb (54.4 kg) IBW/kg (Calculated) : 57  Temp (24hrs), Avg:99.2 F (37.3 C), Min:99.1 F (37.3 C), Max:99.2 F (37.3 C)   Recent Labs Lab 03/25/17 1636 03/25/17 1646 03/25/17 1934  WBC 7.1  --   --   CREATININE 0.47  --   --   LATICACIDVEN  --  1.45 2.56*    Estimated Creatinine Clearance: 74.7 mL/min (by C-G formula based on SCr of 0.47 mg/dL).    No Known Allergies  Antimicrobials this admission: 5/7 vanc >>   5/7 cefepime >>    Dose adjustments this admission:    Microbiology results: 5/7 BCx: sent  Thank you for allowing pharmacy to be a part of this patient's care.  Everette Rank, PharmD 03/25/2017 8:18 PM

## 2017-03-26 ENCOUNTER — Telehealth: Payer: Self-pay | Admitting: *Deleted

## 2017-03-26 ENCOUNTER — Encounter: Payer: Self-pay | Admitting: *Deleted

## 2017-03-26 DIAGNOSIS — Z515 Encounter for palliative care: Secondary | ICD-10-CM

## 2017-03-26 DIAGNOSIS — Z7189 Other specified counseling: Secondary | ICD-10-CM

## 2017-03-26 LAB — RESPIRATORY PANEL BY PCR
Adenovirus: NOT DETECTED
BORDETELLA PERTUSSIS-RVPCR: NOT DETECTED
CORONAVIRUS 229E-RVPPCR: NOT DETECTED
CORONAVIRUS HKU1-RVPPCR: NOT DETECTED
Chlamydophila pneumoniae: NOT DETECTED
Coronavirus NL63: NOT DETECTED
Coronavirus OC43: NOT DETECTED
INFLUENZA B-RVPPCR: NOT DETECTED
Influenza A: NOT DETECTED
MYCOPLASMA PNEUMONIAE-RVPPCR: NOT DETECTED
Metapneumovirus: NOT DETECTED
Parainfluenza Virus 1: NOT DETECTED
Parainfluenza Virus 2: NOT DETECTED
Parainfluenza Virus 3: NOT DETECTED
Parainfluenza Virus 4: NOT DETECTED
RESPIRATORY SYNCYTIAL VIRUS-RVPPCR: NOT DETECTED
Rhinovirus / Enterovirus: NOT DETECTED

## 2017-03-26 LAB — STREP PNEUMONIAE URINARY ANTIGEN: Strep Pneumo Urinary Antigen: NEGATIVE

## 2017-03-26 LAB — HIV ANTIBODY (ROUTINE TESTING W REFLEX): HIV Screen 4th Generation wRfx: NONREACTIVE

## 2017-03-26 LAB — BASIC METABOLIC PANEL
Anion gap: 10 (ref 5–15)
BUN: 20 mg/dL (ref 6–20)
CO2: 27 mmol/L (ref 22–32)
Calcium: 8.8 mg/dL — ABNORMAL LOW (ref 8.9–10.3)
Chloride: 103 mmol/L (ref 101–111)
Creatinine, Ser: 0.49 mg/dL (ref 0.44–1.00)
Glucose, Bld: 187 mg/dL — ABNORMAL HIGH (ref 65–99)
Potassium: 2.6 mmol/L — CL (ref 3.5–5.1)
SODIUM: 140 mmol/L (ref 135–145)

## 2017-03-26 LAB — INFLUENZA PANEL BY PCR (TYPE A & B)
INFLAPCR: NEGATIVE
Influenza B By PCR: NEGATIVE

## 2017-03-26 LAB — MAGNESIUM: MAGNESIUM: 1.8 mg/dL (ref 1.7–2.4)

## 2017-03-26 LAB — SURGICAL PCR SCREEN
MRSA, PCR: NEGATIVE
STAPHYLOCOCCUS AUREUS: NEGATIVE

## 2017-03-26 LAB — LACTIC ACID, PLASMA: LACTIC ACID, VENOUS: 2.6 mmol/L — AB (ref 0.5–1.9)

## 2017-03-26 MED ORDER — POTASSIUM CHLORIDE CRYS ER 20 MEQ PO TBCR
40.0000 meq | EXTENDED_RELEASE_TABLET | ORAL | Status: AC
  Administered 2017-03-26 (×2): 40 meq via ORAL
  Filled 2017-03-26 (×2): qty 2

## 2017-03-26 MED ORDER — METHYLPREDNISOLONE SODIUM SUCC 125 MG IJ SOLR
60.0000 mg | Freq: Three times a day (TID) | INTRAMUSCULAR | Status: DC
  Administered 2017-03-26 – 2017-03-27 (×4): 60 mg via INTRAVENOUS
  Filled 2017-03-26 (×4): qty 2

## 2017-03-26 MED ORDER — ZOLPIDEM TARTRATE ER 12.5 MG PO TBCR: 12.5000 mg | EXTENDED_RELEASE_TABLET | Freq: Every evening | ORAL | Status: DC | PRN

## 2017-03-26 MED ORDER — BENZONATATE 100 MG PO CAPS
100.0000 mg | ORAL_CAPSULE | Freq: Three times a day (TID) | ORAL | Status: DC | PRN
Start: 2017-03-26 — End: 2017-03-27

## 2017-03-26 MED ORDER — GUAIFENESIN ER 600 MG PO TB12
600.0000 mg | ORAL_TABLET | Freq: Two times a day (BID) | ORAL | Status: DC
  Administered 2017-03-26 – 2017-03-28 (×5): 600 mg via ORAL
  Filled 2017-03-26 (×5): qty 1

## 2017-03-26 MED ORDER — NON FORMULARY: 12.5000 mg | Freq: Every evening | Status: DC | PRN

## 2017-03-26 NOTE — Care Management Note (Addendum)
Case Management Note  Patient Details  Name: Erika Hunter MRN: [id number] Date of Birth: 12-21-1970  Subjective/Objective:   Pt admitted with SOB, productive cough                 Action/Plan: Pt [facility]   Expected Discharge Date:  03/27/2017               Expected Discharge Plan:  Home w Hospice Care  In-House Referral:     Discharge planning Services  CM Consult  Post Acute Care Choice:    Choice offered to:  Patient  DME Arranged:    DME Agency:     HH Arranged:    [place] Agency:     Status of Service:  In process, will continue to follow  If discussed at Long Length of Stay Meetings, dates discussed:    Additional CommentsPurcell Mouton, RN 03/26/2017, 10:18 AM

## 2017-03-26 NOTE — Progress Notes (Signed)
[place] with daughter Velna Hatchet to make her aware that her mother was presently not taking any steroid medication.  He had not reached out to [place] since speaking with Mont Dutton on Friday, Mar 22, 2017. Given the telephone number for [facility] to get an update on her mother's condition from the staff caring for her mother.

## 2017-03-26 NOTE — Telephone Encounter (Signed)
Amy from [facility] called to notify Dr. Burr Medico of pt's admission to [facility]. Amy's   Phone     [phone].

## 2017-03-26 NOTE — Consult Note (Signed)
Consultation Note Date: 03/26/2017   Patient Name: Erika Hunter  DOB: May 06, 1971  MRN: [id number]  Age / Sex: 46 y.o., female  PCP: Wardell Honour, MD Referring Physician: Florencia Reasons, MD  Reason for Consultation: Establishing goals of care  HPI/Patient Profile: 46 y.o. 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 She started developing shortness of breath a few days prior to admission. The Hospice RN noted significant dyspnea, however Erika Hunter refused ED evaluation when recommended by Hospice MD. Despite interventions, she continued to worsen overnight. The next day she continued to demonstrate dyspnea, now with mottling on her extremities and O2 saturation at 71%. She refused Oxygen as she wished to continue to smoke. Her roommate decided to bring her to the ED. She was admitted on 03/25/2017 with multifocal pneumonia and hypokalemia. She remains a full code. Palliative consulted to assist in code status discussion.   Clinical Assessment and Goals of Care: Erika Hunter is feeling dramatically better since admission. During our meeting she was sitting up in bed, fully alert and oriented, and fluently conversational. This is our second meeting; I met her in a prior admission shortly after her lung cancer diagnosis.  Erika Hunter recognized me from our prior meeting and was able to relate her medical utilization from when we last met. She explained her rationale for declining palliative chemo, which stemmed from her belief that [place] could heal her and she didn't want to needlessly suffer from the side effects from the chemo. She continues to hope for a miracle cure coming from the divine, not from medicine. Her goal is to remain at home and enjoy her  life there. She does speak with her daughter daily (who lives in [place]), and relates multiple good friends who visit.  In discussing her goal, I queried her expectations on the future. She does seem to accept that she may be dying (from the cancer), but feels like she has "months or more" to live, and that's "only assuming Jesus doesn't step-in first". She was very surprised by this episode of pneumonia, but was grateful that it was being treated and she was improving. I brought up the possibility of a repeat respiratory or other infection, and she did want these treated--though ideally at home. She recognized the need to relate changing symptoms to her Hospice nurse, but this acknowledgement came after I had explained that her infection likely could have been treated at home if she had mentioned her symptoms when they started, rather than waiting until they were severe. At this point, her goal is to treat this pneumonia and get back home with ongoing Hospice support.  Finally, I did bring up code status (which her Hospice SW had started talking with her about this morning). Erika Hunter remains very clear that she wants interventions that would enable her to get home again. I talked her through the realistic outcome if she were to require CPR, defibrillation, and/or intubation (low likelihood of getting off the vent, significant debility if she did, and very low likelihood of being safe to return home without 24hr care). Despite this information, she countered with stories of people she heard about with advanced cancer who got off the vent and lived another ten years. I reinforced that her situation, like theirs, is unique and I can  only speak to what I see as a realistic outcome for her. While she heard this, she does continue to feel like those aggressive interventions would benefit her. She did agree, however, to think on it further and was open to discussing it further tomorrow.   Primary Decision  Maker PATIENT   SUMMARY OF RECOMMENDATIONS    Full code. I will talk with her again about this tomorrow  Plan to treat the treatable then discharge home with ongoing Hospice support (already well established with [facility])  I have spoken with her daughter Velna Hatchet (with pt's permission), and will update her tomorrow  Code Status/Advance Care Planning:  Full code  Palliative Prophylaxis:   Bowel Regimen and Oral Care  Additional Recommendations (Limitations, Scope, Preferences): Erika Hunter would like to treat the treatable and does continue to desire Full Code interventions. That said, her overall goal is to be comfortable at home, and she plans to continue utilizing Hospice support after discharge.  Psycho-social/Spiritual:   Desire for further Chaplaincy support:yes  Additional Recommendations: Education on Hospice  Prognosis:   < 6 months  Discharge Planning: Home with Hospice      Primary Diagnoses: Present on Admission: . Alcohol dependence ([place]) . Generalized anxiety disorder . Tobacco user . Depression . (Resolved) Brain tumor ([place]) . Small cell lung cancer, left ([place]) . Brain metastasis ([place]) . HCAP (healthcare-associated pneumonia) . COPD with acute exacerbation ([place]) . Hypertensive urgency   I have reviewed the medical record, interviewed the patient and family, and examined the patient. The following aspects are pertinent.  Past Medical History:  Diagnosis Date  . Alcohol dependence ([place]) 01/24/2017  . Anxiety    panic disorder  . Asthma   . Brain cancer ([place])   . Cervical dysplasia 11/19/1988   S/P conization  . COPD (chronic obstructive pulmonary disease) ([place]) 01/24/2017  . Depression   . Hyperlipidemia   . Lung cancer ([place])   . Steatohepatitis    h/o heavy alcohol use, hepatitis profile negative for A, B   Social History   Social History  . Marital status: Single    Spouse name: n/a  . Number of children: 2  . Years of education: High School    Occupational History  . Holiday representative   Social History Main Topics  . Smoking status: Current Every Day Smoker    Packs/day: 1.00    Years: 25.00  . Smokeless tobacco: Never Used  . Alcohol use Yes     Comment: wine - few glasses/day  . Drug use: No  . Sexual activity: Yes   Other Topics Concern  . None   Social History Narrative   Marital status: divorced; dating, moved to [place] from [place] to be with her boyfriend..      Children; 2 children; no grandchildren      Lives: with a co-worker      Employment: clerk at gas station x 5.5 years      Tobacco; 3/4 ppd since 46 yo      Alcohol:  3 glasses wine per day.      Drugs: none      Exercise:  none   Family History  Problem Relation Age of Onset  . COPD Mother   . COPD Sister   . Cancer Sister 30    ovarian  . Diabetes Sister   . Mental illness Sister    Scheduled Meds: . enoxaparin (LOVENOX) injection  40 mg Subcutaneous Q24H  .  fludrocortisone  0.3 mg Oral Daily  . folic acid  1 mg Oral Daily  . ipratropium-albuterol  3 mL Nebulization TID  . levETIRAcetam  500 mg Oral BID  . LORazepam  0-4 mg Oral Q6H   Followed by  . [START ON 03/27/2017] LORazepam  0-4 mg Oral Q12H  . multivitamin with minerals  1 tablet Oral Daily  . nicotine  14 mg Transdermal Once  . thiamine  100 mg Oral Daily   Or  . thiamine  100 mg Intravenous Daily  . venlafaxine XR  150 mg Oral Q breakfast   Continuous Infusions: . sodium chloride 75 mL/hr at 03/25/17 2205  . ceFEPime (MAXIPIME) IV Stopped (03/26/17 2831)  . vancomycin     PRN Meds:.guaiFENesin, ibuprofen, ipratropium-albuterol, labetalol, LORazepam **OR** LORazepam, oxyCODONE, polyvinyl alcohol, zolpidem No Known Allergies   Review of Systems  Constitutional: Positive for activity change and fatigue. Negative for appetite change and unexpected weight change.  HENT: Negative for congestion, hearing loss, sore throat and trouble swallowing.   Eyes:  Negative for visual disturbance.  Respiratory: Positive for cough and shortness of breath. Negative for chest tightness.   Cardiovascular: Negative for chest pain and leg swelling.  Gastrointestinal: Negative for abdominal distention, abdominal pain, constipation (unclear on last BM), nausea and vomiting.  Genitourinary: Negative for difficulty urinating and flank pain.  Musculoskeletal: Positive for gait problem (r/t weakness). Negative for back pain.  Skin: Positive for pallor.  Neurological: Positive for weakness. Negative for dizziness and speech difficulty.  Psychiatric/Behavioral: Negative for agitation, confusion and sleep disturbance. The patient is not nervous/anxious.    Physical Exam  Constitutional: She is oriented to person, place, and time. She appears well-developed and well-nourished. No distress.  HENT:  Head: Normocephalic and atraumatic. Hair is abnormal (alopecia).  Mouth/Throat: Oropharynx is clear and moist. No oropharyngeal exudate.  Eyes: EOM are normal.  Neck: Normal range of motion. Neck supple.  Cardiovascular: Regular rhythm.  Tachycardia present.   Pulmonary/Chest: No accessory muscle usage. Tachypnea noted. She has rhonchi.  Abdominal: Soft. Bowel sounds are normal.  Musculoskeletal: Normal range of motion. She exhibits no edema.  Neurological: She is alert and oriented to person, place, and time.  Skin: Skin is warm and dry. There is pallor.  Psychiatric: She has a normal mood and affect. Her speech is normal and behavior is normal. Judgment and thought content normal. Cognition and memory are normal.    Vital Signs: BP 117/74 (BP Location: Left Arm)   Pulse 99   Temp 97.8 F (36.6 C) (Axillary)   Resp (!) 26   Ht 5' 5"  (1.651 m)   Wt 56 kg (123 lb 7.3 oz)   LMP 05/01/2012   SpO2 94%   BMI 20.54 kg/m  Pain Assessment: 0-10   Pain Score: 6    SpO2: SpO2: 94 % O2 Device:SpO2: 94 % O2 Flow Rate: .   IO: Intake/output summary:  Intake/Output  Summary (Last 24 hours) at 03/26/17 0733 Last data filed at 03/26/17 0300  Gross per 24 hour  Intake           488.75 ml  Output                0 ml  Net           488.75 ml    LBM:   Baseline Weight: Weight: 55.3 kg (122 lb) Most recent weight: Weight: 56 kg (123 lb 7.3 oz)     Palliative Assessment/Data: PPS 60%  Time Total: 70 minutes Greater than 50%  of this time was spent counseling and coordinating care related to the above assessment and plan.  Signed by: Charlynn Court, NP Palliative Medicine Team Pager # [phone] (M-F 7a-5p) Team Phone # [phone] (Nights/Weekends)

## 2017-03-26 NOTE — Progress Notes (Signed)
[facility]27 - [facility] G[facility] RN visit at 0815am.  This is a related and covered GIP admission from 03/25/17 to [facility] diagnosis of lung cancer, per Dr. Orpah Melter.  Patient is a FULL CODE.  Patient was admitted for symptoms of increased productive cough, SOB - PNA.  Hospice was notified prior to patient going to Emergency Department.  Visited with patient and visitor at bedside this morning.  Patient was sitting up and eating breakfast when I arrived.  Patient was alert and oriented.  Patient was talking in full sentences without O2 assistance.   Patient states she is feeling a lot better.  Patient denies any needs from [facility] at this time.   Patient receiving: fludrocortisone (FLORINEF) tablet 0.'3mg'$ , Dose 0.3 mg, QD via PO; guaiFENesin (MUCINEX) 12 hr tablet 600 mg, Dose 600 mg, BID via PO; ipratropium-albuterol (DUONEB) 0.5-2.5 (3) MG/3ML nebulizer solution 3 mL, Dose 3 mL, TID via nebulization;  methylPREDNISolone sodium succinate (SOLU-MEDROL) 125 mg/2 mL injection 60 mg, Dose 60 mg, Q8H via IV.  Contrinuous Medications:  ceFEPIme (MAXIPIME) 1g in dextrose 5% 50 mL IVPB, Dose 1 g, Q8H via IV; vancomycin (VANCOCIN) IVPB 750 mg/150 ml premix. PRN medications received today ipratropium-albuterol (DUONEB) 0.5-2.5 (3) MG/3ML nebulizer solution 3 mL, Dose 11m, Q4H PRN via nebulization at 0639am; oxyCODONE (Oxy IR/ROXICODONE) immediate release tablet 5 mg, Dose 5 mg, Q4H PRN via PO at 0706am.   Dr. DOrpah Melterand Dr. YTruitt Merlehave been notified of patient admission to the hospital.  Transfer summary and medication list to be placed on shadow chart.  Will continue to follow patient while in the hospital and anticipate any discharge needs.  Thank you,  AEdyth Gunnels RN, BSN [facility] LPLiaison [phone] All hospital liaisons are now on [place]

## 2017-03-26 NOTE — Progress Notes (Signed)
PROGRESS NOTE  Erika Hunter WRU:[id number] DOB: Jan 19, 1971 DOA: 03/25/2017 PCP: Wardell Honour, MD   Brief summary:   Recently diagnosed extensive stage small cell lung cancer with brain meds, declined palliative chemotherapy, received XRT, on home hospice, admitted with cough, acute on chronic hypoxia respiratory failure, o2 sat's In the 70's on room air, with dyspnea when talks She is on home hospice, but consider intubation if needed, palliative care consulted, goals of care continued  HPI/Recap of past 24 hours:  Feeling better, no fever, denies pain, able to talk in full sentences, not in respiratory distress at rest   Assessment/Plan: Principal Problem:   HCAP (healthcare-associated pneumonia) Active Problems:   Generalized anxiety disorder   Tobacco user   Depression   Small cell lung cancer, left ([place])   Brain metastasis ([place])   Alcohol dependence ([place])   COPD with acute exacerbation ([place])   Hypertensive urgency   1. Acute on chronic hypoxic respiratory failure due to HCAP /copd exacerbation/extensive stage small cell lung cancer - Pt presents with 4 days of progressive SOB and productive cough, she is symptomatic at rest , hypoxic in the 70's on room air - CTA no PE, "New patchy bilateral peripheral airspace opacities throughout both lungs, most prominent at the right upper lobe, concerning for multifocal pneumonia." Afebrile and no leukocytosis, respiratory viral penal negative, mrsa screening negative , urine strep pneumo antigen negative, sputum sample pending collection - She was started on empiric vancomycin and cefepime in ED, continue cefepime, d/c vanc, start solumedrol due to wheezing on exam, continue nebs, mucinex - - Supportive care with supplemental O2, likely will need home o2 at discharge  2. Small cell lung cancer, metastatic  - Pt has SCLC involving LUL with brain metastases , on steroids and keppra which is continued - She is followed by oncology  and has been treated with radiation, but declined the palliative chemotherapy that was recommended by oncologist  - She is under the care of home hospice but wants to be full code, confirms that she wants to be intubated if necessary to prolong her life  - Palliative care consultation requested    3. Hypertensive urgency  - Diastolic pressures sustaining in 100-range  - Treat with prn labetalol IVP's for now  D/c home meds florinef  4. Hypokalemia  - Serum potassium is 2.6 on admission - replace k,  Magnesium level wnl  - Continue telemetry monitoring and repeat chem panel in am    5. Depression, anxiety  - Appears stable  - Continue Effexor   6. Alcohol dependence  - No withdrawal s/s on admission  - Monitor with CIWA and prn Ativan   7. Current smoker  - Nicotine patch provided at pt's request       DVT prophylaxis: sq Lovenox Code Status: Full  Family Communication: Friends updated at bedside at pt's request Disposition Plan: return home on home hospice, likely will need home 02 Consults called: palliative care  Procedures:  none  Antibiotics:  vanc from admission to 5/8  Cefepime from admission   Objective: BP 107/73 (BP Location: Left Arm)   Pulse (!) 113   Temp 98.9 F (37.2 C) (Oral)   Resp 20   Ht '5\' 5"'$  (1.651 m)   Wt 56 kg (123 lb 7.3 oz)   LMP 05/01/2012   SpO2 92%   BMI 20.54 kg/m   Intake/Output Summary (Last 24 hours) at 03/26/17 1757 Last data filed at 03/26/17 1348  Gross  per 24 hour  Intake           738.75 ml  Output              650 ml  Net            88.75 ml   Filed Weights   03/25/17 1604 03/25/17 2000 03/25/17 2223  Weight: 55.3 kg (122 lb) 54.4 kg (120 lb) 56 kg (123 lb 7.3 oz)    Exam:   General:  Frail, chronically ill, cushingoid facies  Cardiovascular: sinus tachycardia  Respiratory: diffuse bilateral wheezing  Abdomen: Soft/ND/NT, positive BS  Musculoskeletal: No Edema  Neuro: aaox3  Data  Reviewed: Basic Metabolic Panel:  Recent Labs Lab 03/25/17 1636 03/25/17 1731 03/26/17 0922  NA 138  --  140  K 2.6*  --  2.6*  CL 98*  --  103  CO2 29  --  27  GLUCOSE 115*  --  187*  BUN 21*  --  20  CREATININE 0.47  --  0.49  CALCIUM 9.3  --  8.8*  MG  --  2.0 1.8   Liver Function Tests:  Recent Labs Lab 03/25/17 1636  AST 13*  ALT 21  ALKPHOS 69  BILITOT 0.5  PROT 6.9  ALBUMIN 2.7*   No results for input(s): LIPASE, AMYLASE in the last 168 hours. No results for input(s): AMMONIA in the last 168 hours. CBC:  Recent Labs Lab 03/25/17 1636  WBC 7.1  NEUTROABS 6.6  HGB 11.8*  HCT 34.9*  MCV 92.8  PLT 247   Cardiac Enzymes:    Recent Labs Lab 03/25/17 1636  TROPONINI <0.03   BNP (last 3 results) No results for input(s): BNP in the last 8760 hours.  ProBNP (last 3 results) No results for input(s): PROBNP in the last 8760 hours.  CBG: No results for input(s): GLUCAP in the last 168 hours.  Recent Results (from the past 240 hour(s))  Surgical PCR screen     Status: None   Collection Time: 03/26/17 11:00 AM  Result Value Ref Range Status   MRSA, PCR NEGATIVE NEGATIVE Final   Staphylococcus aureus NEGATIVE NEGATIVE Final    Comment:        The Xpert SA Assay (FDA approved for NASAL specimens in patients over 48 years of age), is one component of a comprehensive surveillance program.  Test performance has been validated by [facility] for patients greater than or equal to 48 year old. It is not intended to diagnose infection nor to guide or monitor treatment.   Respiratory Panel by PCR     Status: None   Collection Time: 03/26/17 11:29 AM  Result Value Ref Range Status   Adenovirus NOT DETECTED NOT DETECTED Final   Coronavirus 229E NOT DETECTED NOT DETECTED Final   Coronavirus HKU1 NOT DETECTED NOT DETECTED Final   Coronavirus NL63 NOT DETECTED NOT DETECTED Final   Coronavirus OC43 NOT DETECTED NOT DETECTED Final   Metapneumovirus NOT  DETECTED NOT DETECTED Final   Rhinovirus / Enterovirus NOT DETECTED NOT DETECTED Final   Influenza A NOT DETECTED NOT DETECTED Final   Influenza B NOT DETECTED NOT DETECTED Final   Parainfluenza Virus 1 NOT DETECTED NOT DETECTED Final   Parainfluenza Virus 2 NOT DETECTED NOT DETECTED Final   Parainfluenza Virus 3 NOT DETECTED NOT DETECTED Final   Parainfluenza Virus 4 NOT DETECTED NOT DETECTED Final   Respiratory Syncytial Virus NOT DETECTED NOT DETECTED Final   Bordetella pertussis NOT DETECTED  NOT DETECTED Final   Chlamydophila pneumoniae NOT DETECTED NOT DETECTED Final   Mycoplasma pneumoniae NOT DETECTED NOT DETECTED Final    Comment: Performed at [facility], [place] [address], [place], [place] [address]     Studies: Ct Angio Chest Pe W/cm &/or Wo Cm  Result Date: 03/25/2017 CLINICAL DATA:  Acute onset of productive cough and shortness of breath. Current history of lung cancer. Initial encounter. EXAM: CT ANGIOGRAPHY CHEST WITH CONTRAST TECHNIQUE: Multidetector CT imaging of the chest was performed using the standard protocol during bolus administration of intravenous contrast. Multiplanar CT image reconstructions and MIPs were obtained to evaluate the vascular anatomy. CONTRAST:  100 mL of Isovue 370 IV contrast COMPARISON:  CT of the chest performed 01/19/2017, and chest radiograph performed earlier today at 4:46 p.m. FINDINGS: Cardiovascular: The heart is normal in size. The thoracic aorta is grossly unremarkable. The great vessels are unremarkable in appearance. Mediastinum/Nodes: The patient's left upper lobe mass has decreased in size, now measuring approximately 8.5 x 4.1 x 2.7 cm. This extends to the left hilum, and as before, mass extends into the left upper pulmonary vein. This partially encases the left main pulmonary artery. No mediastinal lymphadenopathy is seen. No pericardial effusion is identified. The visualized portions of the thyroid gland are unremarkable. No axillary  lymphadenopathy is appreciated. Lungs/Pleura: There are new patchy bilateral peripheral airspace opacities throughout both lungs, most prominent at the right upper lobe, concerning for multifocal pneumonia. No pleural effusion or pneumothorax is seen. No additional masses are identified. Underlying emphysema is noted. Upper Abdomen: The visualized portions of the liver and spleen are grossly unremarkable. The visualized portions of the adrenal glands and kidneys are within normal limits. A tiny hiatal hernia is noted. Musculoskeletal: No acute osseous abnormalities are identified. There is mild chronic loss of height at the superior endplate of T9. The visualized musculature is unremarkable in appearance. Review of the MIP images confirms the above findings. IMPRESSION: 1. New patchy bilateral peripheral airspace opacities throughout both lungs, most prominent at the right upper lobe, concerning for multifocal pneumonia. 2. Left upper lobe lung mass has decreased in size, now measuring approximately 8.5 x 4.1 x 2.7 cm. This extends into the left hilum, and as before, into the left upper pulmonary vein. There is partial encasement of the left main pulmonary artery. 3. Underlying bilateral emphysema noted. 4. Tiny hiatal hernia seen. 5. Mild chronic loss of height at the superior endplate of T9. Electronically Signed   By: Garald Balding M.D.   On: 03/25/2017 19:29    Scheduled Meds: . enoxaparin (LOVENOX) injection  40 mg Subcutaneous Q24H  . folic acid  1 mg Oral Daily  . guaiFENesin  600 mg Oral BID  . ipratropium-albuterol  3 mL Nebulization TID  . levETIRAcetam  500 mg Oral BID  . LORazepam  0-4 mg Oral Q6H   Followed by  . [START ON 03/27/2017] LORazepam  0-4 mg Oral Q12H  . methylPREDNISolone (SOLU-MEDROL) injection  60 mg Intravenous Q8H  . multivitamin with minerals  1 tablet Oral Daily  . nicotine  14 mg Transdermal Once  . thiamine  100 mg Oral Daily   Or  . thiamine  100 mg Intravenous Daily   . venlafaxine XR  150 mg Oral Q breakfast    Continuous Infusions: . ceFEPime (MAXIPIME) IV Stopped (03/26/17 1347)     Time spent: 19mns  Madelein Mahadeo MD, PhD  [facility] Pager [phone] If 7PM-7AM, please contact night-coverage at  [email or web address], password [facility] 03/26/2017, 5:57 PM  LOS: 1 day

## 2017-03-27 LAB — BASIC METABOLIC PANEL
Anion gap: 7 (ref 5–15)
BUN: 20 mg/dL (ref 6–20)
CO2: 27 mmol/L (ref 22–32)
CREATININE: 0.35 mg/dL — AB (ref 0.44–1.00)
Calcium: 8.6 mg/dL — ABNORMAL LOW (ref 8.9–10.3)
Chloride: 105 mmol/L (ref 101–111)
GFR calc Af Amer: >60 mL/min (ref >60)
Glucose, Bld: 158 mg/dL — ABNORMAL HIGH (ref 65–99)
Potassium: 4 mmol/L (ref 3.5–5.1)
SODIUM: 139 mmol/L (ref 135–145)

## 2017-03-27 LAB — EXPECTORATED SPUTUM ASSESSMENT W GRAM STAIN, RFLX TO RESP C

## 2017-03-27 LAB — CBC WITH DIFFERENTIAL/PLATELET
Basophils Absolute: 0 10*3/uL (ref 0.0–0.1)
Basophils Relative: 0 %
EOS ABS: 0 10*3/uL (ref 0.0–0.7)
EOS PCT: 0 %
HCT: 30 % — ABNORMAL LOW (ref 36.0–46.0)
Hemoglobin: 10 g/dL — ABNORMAL LOW (ref 12.0–15.0)
LYMPHS ABS: 0.3 10*3/uL — AB (ref 0.7–4.0)
Lymphocytes Relative: 6 %
MCH: 31.1 pg (ref 26.0–34.0)
MCHC: 33.3 g/dL (ref 30.0–36.0)
MCV: 93.2 fL (ref 78.0–100.0)
Monocytes Absolute: 0.2 10*3/uL (ref 0.1–1.0)
Monocytes Relative: 4 %
Neutro Abs: 4.7 10*3/uL (ref 1.7–7.7)
Neutrophils Relative %: 90 %
PLATELETS: 207 10*3/uL (ref 150–400)
RBC: 3.22 MIL/uL — ABNORMAL LOW (ref 3.87–5.11)
RDW: 15.2 % (ref 11.5–15.5)
WBC: 5.2 10*3/uL (ref 4.0–10.5)

## 2017-03-27 LAB — EXPECTORATED SPUTUM ASSESSMENT W REFEX TO RESP CULTURE

## 2017-03-27 LAB — LACTIC ACID, PLASMA: LACTIC ACID, VENOUS: 1.2 mmol/L (ref 0.5–1.9)

## 2017-03-27 MED ORDER — LEVOFLOXACIN 500 MG PO TABS
500.0000 mg | ORAL_TABLET | Freq: Every day | ORAL | Status: DC
  Administered 2017-03-27: 500 mg via ORAL
  Filled 2017-03-27: qty 1

## 2017-03-27 MED ORDER — PREDNISONE 20 MG PO TABS
40.0000 mg | ORAL_TABLET | Freq: Every day | ORAL | Status: DC
  Administered 2017-03-28: 40 mg via ORAL
  Filled 2017-03-27: qty 2

## 2017-03-27 MED ORDER — BENZONATATE 100 MG PO CAPS
100.0000 mg | ORAL_CAPSULE | Freq: Two times a day (BID) | ORAL | Status: DC
  Administered 2017-03-27 – 2017-03-28 (×3): 100 mg via ORAL
  Filled 2017-03-27 (×3): qty 1

## 2017-03-27 NOTE — Progress Notes (Signed)
[facility] 1427 - [facility] [place] GIP RN visit at 0810am.  This is a related and covered GIP admission from 03/25/17 to [facility] diagnosis of lung cancer, per Dr. Orpah Melter.  Patient is a FULL CODE.  Patient was admitted for symptoms of increased productive cough, SOB - PNA.  Hospice was notified prior to patient going to Emergency Department.  Visited patient this morning at bedside.  Patient was sleeping very well.  Did not wake patient.  Per roommate's daughter, also at bedside, patient has been feeling a lot better.   She advised that patient had just fallen asleep.  Patient in NAD at this time.  Sleeping comfortably with no O2 demands.   Patient receiving: enoxaparin (LOVENOX) injection 40 mg, Dose 40 mg, Q24H via [place]; guaiFENesin (MUCINEX) 12 hr tablet 600 mg, Dose 600 mg, BID via PO; ipratropium-albuterol (DUONEB) 0.5-2.5 (3) MG/3ML nebulizer solution 3 mL, Dose 3 mL, TID via nebulization;  methylPREDNISolone sodium succinate (SOLU-MEDROL) 125 mg/2 mL injection 60 mg, Dose 60 mg, Q8H via IV.  Contrinuous Medications:  ceFEPIme (MAXIPIME) 1g in dextrose 5% 50 mL IVPB, Dose 1 g, Q8H via IV. PRN medications received today:  guaFENesin (ROBITUSSIN) 100 MG/5ML solution 200 mg, Dose 200 mg, Q4H PRN via PO; ipratropium-albuterol (DUONEB) 0.5-2.5 (3) MG/3ML nebulizer solution 3 mL, Dose 3 mL, Q4H PRN via nebulization;  oxyCODONE (Oxy IR/ROXICODONE) immediate release tablet 5 mg, Dose 5 mg, Q4H PRN via PO.  Will continue to follow patient while in the hospital and anticipate any discharge needs.  Thank you,  Edyth Gunnels, RN, BSN [facility] Liaison [phone]  All hospital liaisons are now on [place]

## 2017-03-27 NOTE — Clinical Social Work Note (Signed)
Pt seen on 5/08 and 5/09, discussed with palliative care NP Charlynn Court both days. Discussed with [facility] liaison Edyth Gunnels and RN case manger Cookie. Pt known to this homecare LCSW who follows her at home. She is fairly newly diagnosed, wishes to continue to hope for recovery and healing through God. After discussions, the plan is to return home with hospice to follow; she presently has RN and LCSW following, today said she will consider a hospice aide to help her to conserve energy at home. Pt gave consent that this LCSW can be in touch with her dtr/hcpoa Velna Hatchet, who lives in [place], and has stated she can come to help if needed. Anticipate d/c tomorrow; bedside staff can assess need for ambulance transport versus private vehicle.

## 2017-03-27 NOTE — Progress Notes (Signed)
PROGRESS NOTE  Erika Hunter YBO:[id number] DOB: [birth year] DOA: 03/25/2017 PCP: Wardell Honour, MD   Brief summary:   76 Recently diagnosed extensive stage small cell lung cancer with brain meds, declined palliative chemotherapy, received XRT, on home hospice, admitted with cough, acute on chronic hypoxia respiratory failure, o2 sat's In the 70's on room air, with dyspnea when talks She is on home hospice, but consider intubation if needed, palliative care consulted, goals of care continued  HPI/Recap of past 24 hours:  Feeling better, no fever, denies pain, able to talk in full sentences, not in respiratory distress at rest   Assessment/Plan: Principal Problem:   HCAP (healthcare-associated pneumonia) Active Problems:   Generalized anxiety disorder   Tobacco user   Depression   Small cell lung cancer, left ([place])   Brain metastasis ([place])   Alcohol dependence ([place])   COPD with acute exacerbation ([place])   Hypertensive urgency   1. Acute on chronic hypoxic respiratory failure due to HCAP /copd exacerbation/extensive stage small cell lung cancer - Pt presents with 4 days of progressive SOB and productive cough, she is symptomatic at rest , hypoxic in the 70's on room air.  desat screen is pending - CTA no PE, "New patchy bilateral peripheral airspace opacities throughout both lungs, most prominent at the right upper lobe, concerning for multifocal pneumonia." Afebrile and no leukocytosis--do not feel she has severe sepsis. respiratory viral penal negative, mrsa screening negative , urine strep pneumo antigen negative, sputum sample pending collection - She was started on empiric vancomycin and cefepime in ED, continue cefepime, d/c vanc 5/8-->all abx consolidated to po levaquin changed solumedrol -->prednisone 40 daily as improved wheeze  2. Small cell lung cancer, metastatic  - Pt has SCLC involving LUL with brain metastases , on steroids and keppra which is continued - She is  followed by oncology and has been treated with radiation, but declined the palliative chemotherapy that was recommended by oncologist  - She is under the care of home hospice but wants to be full code, confirms that she wants to be intubated if necessary to prolong her life  - Palliative care consultation appreciated  3. Hypertensive urgency  - Diastolic pressures sustaining in 100-range  - Treat with prn labetalol IVP's for now  D/c home meds florinef  4. Hypokalemia  - Serum potassium is 2.6 on admission - replace k,  Magnesium level wnl  - d/c telemetry monitoring  repeat chem panel in am    5. Depression, anxiety  - Appears stable  - Continue Effexor   6. Alcohol dependence  - No withdrawal s/s on admission  - Monitor with CIWA and prn Ativan   7. Current smoker  - Nicotine patch provided at pt's request       DVT prophylaxis: sq Lovenox Code Status: Full  Family Communication: Friends updated at bedside at pt's request Disposition Plan: return home on home hospice, likely 5/10 if remaiing stable on po meds--?will need home 02 Consults called: palliative care  Procedures:  none  Antibiotics:  vanc from admission to 5/8  Cefepime from admission   Objective: BP 121/72 (BP Location: Left Arm)   Pulse 89   Temp 98 F (36.7 C) (Axillary)   Resp 20   Ht '5\' 5"'$  (1.651 m)   Wt 56 kg (123 lb 7.3 oz)   LMP 05/01/2012   SpO2 90%   BMI 20.54 kg/m   Intake/Output Summary (Last 24 hours) at 03/27/17 1454 Last data filed  at 03/27/17 0700  Gross per 24 hour  Intake              100 ml  Output              300 ml  Net             -200 ml   Filed Weights   03/25/17 1604 03/25/17 2000 03/25/17 2223  Weight: 55.3 kg (122 lb) 54.4 kg (120 lb) 56 kg (123 lb 7.3 oz)    Exam:   Frail pleasant bald cushingoid  eomi nct no pallor mod dentitision  Flat affect s1 s 2no m/r/g abd sof tnt nd no rebound  Data Reviewed: Basic Metabolic Panel:  Recent  Labs Lab 03/25/17 1636 03/25/17 1731 03/26/17 0922 03/27/17 0510  NA 138  --  140 139  K 2.6*  --  2.6* 4.0  CL 98*  --  103 105  CO2 29  --  27 27  GLUCOSE 115*  --  187* 158*  BUN 21*  --  20 20  CREATININE 0.47  --  0.49 0.35*  CALCIUM 9.3  --  8.8* 8.6*  MG  --  2.0 1.8  --    Liver Function Tests:  Recent Labs Lab 03/25/17 1636  AST 13*  ALT 21  ALKPHOS 69  BILITOT 0.5  PROT 6.9  ALBUMIN 2.7*   No results for input(s): LIPASE, AMYLASE in the last 168 hours. No results for input(s): AMMONIA in the last 168 hours. CBC:  Recent Labs Lab 03/25/17 1636 03/27/17 0510  WBC 7.1 5.2  NEUTROABS 6.6 4.7  HGB 11.8* 10.0*  HCT 34.9* 30.0*  MCV 92.8 93.2  PLT 247 207   Cardiac Enzymes:    Recent Labs Lab 03/25/17 1636  TROPONINI <0.03   BNP (last 3 results) No results for input(s): BNP in the last 8760 hours.  ProBNP (last 3 results) No results for input(s): PROBNP in the last 8760 hours.  CBG: No results for input(s): GLUCAP in the last 168 hours.  Recent Results (from the past 240 hour(s))  Blood Culture (routine x 2)     Status: None (Preliminary result)   Collection Time: 03/25/17  7:40 PM  Result Value Ref Range Status   Specimen Description BLOOD RIGHT HAND  Final   Special Requests IN PEDIATRIC BOTTLE BCAV  Final   Culture   Final    NO GROWTH 1 DAY Performed at [facility], [address] [place], [place] [address]    Report Status PENDING  Incomplete  Blood Culture (routine x 2)     Status: None (Preliminary result)   Collection Time: 03/25/17  7:45 PM  Result Value Ref Range Status   Specimen Description BLOOD LEFT AC  Final   Special Requests BOTTLES DRAWN AEROBIC AND ANAEROBIC BCAV  Final   Culture   Final    NO GROWTH 1 DAY Performed at [facility], [place] [address], [place], [place] [address]    Report Status PENDING  Incomplete  Surgical PCR screen     Status: None   Collection Time: 03/26/17 11:00 AM  Result Value  Ref Range Status   MRSA, PCR NEGATIVE NEGATIVE Final   Staphylococcus aureus NEGATIVE NEGATIVE Final    Comment:        The Xpert SA Assay (FDA approved for NASAL specimens in patients over 60 years of age), is one component of a comprehensive surveillance program.  Test performance  has been validated by [facility] for patients greater than or equal to 9 year old. It is not intended to diagnose infection nor to guide or monitor treatment.   Respiratory Panel by PCR     Status: None   Collection Time: 03/26/17 11:29 AM  Result Value Ref Range Status   Adenovirus NOT DETECTED NOT DETECTED Final   Coronavirus 229E NOT DETECTED NOT DETECTED Final   Coronavirus HKU1 NOT DETECTED NOT DETECTED Final   Coronavirus NL63 NOT DETECTED NOT DETECTED Final   Coronavirus OC43 NOT DETECTED NOT DETECTED Final   Metapneumovirus NOT DETECTED NOT DETECTED Final   Rhinovirus / Enterovirus NOT DETECTED NOT DETECTED Final   Influenza A NOT DETECTED NOT DETECTED Final   Influenza B NOT DETECTED NOT DETECTED Final   Parainfluenza Virus 1 NOT DETECTED NOT DETECTED Final   Parainfluenza Virus 2 NOT DETECTED NOT DETECTED Final   Parainfluenza Virus 3 NOT DETECTED NOT DETECTED Final   Parainfluenza Virus 4 NOT DETECTED NOT DETECTED Final   Respiratory Syncytial Virus NOT DETECTED NOT DETECTED Final   Bordetella pertussis NOT DETECTED NOT DETECTED Final   Chlamydophila pneumoniae NOT DETECTED NOT DETECTED Final   Mycoplasma pneumoniae NOT DETECTED NOT DETECTED Final    Comment: Performed at [facility], [place] [address], [place], [place] [address]  Culture, sputum-assessment     Status: None   Collection Time: 03/27/17  9:11 AM  Result Value Ref Range Status   Specimen Description SPUTUM  Final   Special Requests Immunocompromised  Final   Sputum evaluation THIS SPECIMEN IS ACCEPTABLE FOR SPUTUM CULTURE  Final   Report Status 03/27/2017 FINAL  Final     Studies: No results found.  Scheduled  Meds: . benzonatate  100 mg Oral BID  . enoxaparin (LOVENOX) injection  40 mg Subcutaneous Q24H  . folic acid  1 mg Oral Daily  . guaiFENesin  600 mg Oral BID  . ipratropium-albuterol  3 mL Nebulization TID  . levETIRAcetam  500 mg Oral BID  . levofloxacin  500 mg Oral Daily  . LORazepam  0-4 mg Oral Q6H   Followed by  . LORazepam  0-4 mg Oral Q12H  . multivitamin with minerals  1 tablet Oral Daily  . [START ON 03/28/2017] predniSONE  40 mg Oral QAC breakfast  . thiamine  100 mg Oral Daily   Or  . thiamine  100 mg Intravenous Daily  . venlafaxine XR  150 mg Oral Q breakfast    Continuous Infusions:    Time spent: 35mns  SNita SellsMD, PhD  [facility] Pager [phone] If 7PM-7AM, please contact night-coverage at [email or web address], password [email or web address]5/07/2017, 2:54 PM  LOS: 2 days

## 2017-03-27 NOTE — Progress Notes (Signed)
Daily Progress Note   Patient Name: Erika Hunter       Date: 03/27/2017 DOB: 10/16/1971  Age: 46 y.o. MRN#: [id number] Attending Physician: Nita Sells, MD Primary Care Physician: Wardell Honour, MD Admit Date: 03/25/2017  Reason for Consultation/Follow-up: Non pain symptom management and Psychosocial/spiritual support  Subjective: Cyncere continues to feel much better and is eager for discharge home, which is expected tomorrow. She has no complaints and no current concerns.   Length of Stay: 2  Current Medications: Scheduled Meds:  . benzonatate  100 mg Oral BID  . enoxaparin (LOVENOX) injection  40 mg Subcutaneous Q24H  . folic acid  1 mg Oral Daily  . guaiFENesin  600 mg Oral BID  . ipratropium-albuterol  3 mL Nebulization TID  . levETIRAcetam  500 mg Oral BID  . levofloxacin  500 mg Oral Daily  . multivitamin with minerals  1 tablet Oral Daily  . [START ON 03/28/2017] predniSONE  40 mg Oral QAC breakfast  . thiamine  100 mg Oral Daily   Or  . thiamine  100 mg Intravenous Daily  . venlafaxine XR  150 mg Oral Q breakfast    Continuous Infusions:   PRN Meds: guaiFENesin, ibuprofen, ipratropium-albuterol, labetalol, LORazepam **OR** LORazepam, oxyCODONE, polyvinyl alcohol, zolpidem  Physical Exam    Constitutional: She is oriented to person, place, and time. She appears well-developed and well-nourished. No distress.  HENT:  Head: Normocephalic and atraumatic. Hair is abnormal (alopecia).  Mouth/Throat: Oropharynx is clear and moist. No oropharyngeal exudate.  Eyes: EOM are normal.  Neck: Normal range of motion. Neck supple.  Cardiovascular: Regular rhythm.  Tachycardia present.   Pulmonary/Chest: No accessory muscle usage. Tachypnea noted. She has rhonchi.  Abdominal: Soft. Bowel sounds are normal.    Musculoskeletal: Normal range of motion. She exhibits no edema.  Neurological: She is alert and oriented to person, place, and time.  Skin: Skin is warm and dry. There is pallor.  Psychiatric: She has a normal mood and affect. Her speech is normal and behavior is normal. Judgment and thought content normal. Cognition and memory are normal.        Vital Signs: BP 140/89 (BP Location: Left Arm)   Pulse (!) 107   Temp 98 F (36.7 C) (Axillary)   Resp 20   Ht 5' 5"  (1.651 m)   Wt 56 kg (123 lb 7.3 oz)   LMP 05/01/2012   SpO2 99%   BMI 20.54 kg/m  SpO2: SpO2: 99 % O2 Device: O2 Device: Not Delivered O2 Flow Rate:    Intake/output summary:  Intake/Output Summary (Last 24 hours) at 03/27/17 1553 Last data filed at 03/27/17 0700  Gross per 24 hour  Intake              100 ml  Output              300 ml  Net             -200 ml   LBM: Last BM Date: 03/24/17 Baseline Weight: Weight: 55.3 kg (122 lb) Most recent weight: Weight: 56 kg (123 lb 7.3 oz)  Palliative Assessment/Data: PPS 60%   Flowsheet Rows  Most Recent Value  Intake Tab  Referral Department  Hospitalist  Unit at Time of Referral  Cardiac/Telemetry Unit  Palliative Care Primary Diagnosis  Cancer  Date Notified  03/25/17  Palliative Care Type  Return patient Palliative Care  Reason for referral  Clarify Goals of Care  Date of Admission  03/25/17  Date first seen by Palliative Care  03/26/17  # of days Palliative referral response time  1 Day(s)  # of days IP prior to Palliative referral  0  Clinical Assessment  Psychosocial & Spiritual Assessment  Palliative Care Outcomes      Patient Active Problem List   Diagnosis Date Noted  . HCAP (healthcare-associated pneumonia) 03/25/2017  . COPD with acute exacerbation ([place]) 03/25/2017  . Hypertensive urgency 03/25/2017  . Hypokalemia   . Orthostatic hypotension 01/26/2017  . Goals of care, counseling/discussion   . Palliative care by specialist   . Alcohol  dependence ([place]) 01/24/2017  . Headache 01/24/2017  . COPD (chronic obstructive pulmonary disease) ([place]) 01/24/2017  . Brain metastasis ([place]) 01/23/2017  . Small cell lung cancer, left ([place]) 01/22/2017  . Lung mass   . Depression 09/11/2016  . History of abnormal cervical Pap smear 09/11/2016  . Hyperlipidemia with target low density lipoprotein (LDL) cholesterol less than 100 mg/dL 04/17/2012  . Generalized anxiety disorder 04/17/2012  . Tobacco user 04/17/2012    Palliative Care Assessment & Plan   HPI: 46 y.o. 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 She started developing shortness of breath a few days prior to admission. The Hospice RN noted significant dyspnea, however Ms. Cedillo refused ED evaluation when recommended by Hospice MD. Despite interventions, she continued to worsen overnight. The next day she continued to demonstrate dyspnea, now with mottling on her extremities and O2 saturation at 71%. She refused Oxygen as she wished to continue to smoke. Her roommate decided to bring her to the ED. She was admitted on 03/25/2017 with multifocal pneumonia and hypokalemia. She remains a full code. Palliative consulted to assist in code status discussion.   Assessment: I met with [place] again today at her request. She continues to feel better and is eager for discharge home, which is expected tomorrow. She does plan to resume hospice support after discharge. I have also spoken at length with the [facility] SW, Lelon Frohlich, who is up to date on my conversations with Blanch Media.  We talked briefly today about her thoughts surrounding code status. She reports that she did think on it more overnight, and continues to feel that these aggressive interventions are in her best interest. She  struggled to explain her ration for this decision, but reiterates her faith that Jesus will heal her and resuscitation should be attempted. She was appreciative of having the time to think on it further, however.   I also updated Brandi on her mother's status and the plan for discharge, likely tomorrow.   Recommendations/Plan:  Full code.   Plan to treat the treatable then discharge home with ongoing Hospice support (already well established with [facility])  Goals of Care and Additional Recommendations: Amylah would like to treat the treatable and does continue to desire Full Code interventions. That said, her overall goal is to be comfortable at home, and she plans to continue utilizing Hospice support after  discharge.  Code Status:  Full code  Prognosis:   < 6 months  Discharge Planning:  Home with Hospice  Care plan was discussed with pt and her daughter  Thank you for allowing the Palliative Medicine Team to assist in the care of this patient.  Total time: 25 minutes    Greater than 50%  of this time was spent counseling and coordinating care related to the above assessment and plan.  Charlynn Court, NP Palliative Medicine Team [phone] pager (7a-5p) Team Phone # [phone]

## 2017-03-27 NOTE — Progress Notes (Signed)
SATURATION QUALIFICATIONS: (This note is used to comply with regulatory documentation for home oxygen)  Patient Saturations on Room Air at Rest 93%  Patient Saturations on Room Air while Ambulating 95-99%  Patient Saturations on 0 Liters of oxygen while Ambulating  Not needed  Please briefly explain why patient needs home oxygen:  Pt ambulated 50 feet to 75 feet without SOB did require oxygen, oxygen remained at 95-99% room air Pt request a home sat monitor to check O2 sats at home.   SRP,RN.

## 2017-03-28 LAB — CBC WITH DIFFERENTIAL/PLATELET
Basophils Absolute: 0 10*3/uL (ref 0.0–0.1)
Basophils Relative: 0 %
Eosinophils Absolute: 0 10*3/uL (ref 0.0–0.7)
Eosinophils Relative: 0 %
HEMATOCRIT: 32.2 % — AB (ref 36.0–46.0)
HEMOGLOBIN: 10.7 g/dL — AB (ref 12.0–15.0)
LYMPHS ABS: 0.5 10*3/uL — AB (ref 0.7–4.0)
LYMPHS PCT: 6 %
MCH: 31.2 pg (ref 26.0–34.0)
MCHC: 33.2 g/dL (ref 30.0–36.0)
MCV: 93.9 fL (ref 78.0–100.0)
Monocytes Absolute: 0.4 10*3/uL (ref 0.1–1.0)
Monocytes Relative: 5 %
NEUTROS PCT: 89 %
Neutro Abs: 8.6 10*3/uL — ABNORMAL HIGH (ref 1.7–7.7)
Platelets: 221 10*3/uL (ref 150–400)
RBC: 3.43 MIL/uL — AB (ref 3.87–5.11)
RDW: 15.4 % (ref 11.5–15.5)
WBC: 9.6 10*3/uL (ref 4.0–10.5)

## 2017-03-28 LAB — BASIC METABOLIC PANEL
Anion gap: 7 (ref 5–15)
BUN: 13 mg/dL (ref 6–20)
CHLORIDE: 97 mmol/L — AB (ref 101–111)
CO2: 33 mmol/L — ABNORMAL HIGH (ref 22–32)
Calcium: 8.4 mg/dL — ABNORMAL LOW (ref 8.9–10.3)
Creatinine, Ser: 0.4 mg/dL — ABNORMAL LOW (ref 0.44–1.00)
GFR calc Af Amer: >60 mL/min (ref >60)
GFR calc non Af Amer: >60 mL/min (ref >60)
Glucose, Bld: 78 mg/dL (ref 65–99)
POTASSIUM: 3.8 mmol/L (ref 3.5–5.1)
SODIUM: 137 mmol/L (ref 135–145)

## 2017-03-28 MED ORDER — LEVOFLOXACIN 750 MG PO TABS: 750.0000 mg | ORAL_TABLET | Freq: Every day | ORAL | 0 refills | Status: DC

## 2017-03-28 MED ORDER — LEVOFLOXACIN 750 MG PO TABS
750.0000 mg | ORAL_TABLET | Freq: Every day | ORAL | Status: DC
  Administered 2017-03-28: 750 mg via ORAL
  Filled 2017-03-28: qty 1

## 2017-03-28 MED ORDER — PREDNISONE 20 MG PO TABS: 40.0000 mg | ORAL_TABLET | Freq: Every day | ORAL | 0 refills | Status: DC

## 2017-03-28 NOTE — Discharge Summary (Signed)
Physician Discharge Summary  Erika Hunter GTX:[id number] DOB: 06-Mar-1971 DOA: 03/25/2017  PCP: Wardell Honour, MD  Admit date: 03/25/2017 Discharge date: 03/28/2017  Time spent: 45 minutes  Recommendations for Outpatient Follow-up:  1. Patient will need to complete course of antibiotics with Levaquin and also complete course of redness around as an outpatient 2. Strongly recommend hospice follow and flipped over to full comfort care if agent is willing to do the same as an outpatient 3. There is high risk for patient being readmitted given progression of her disease state and hospice will follow at home 4. Have resumed patient's Midodrine Florinef although her blood pressures were in the 120s to 130s in the hospital without. Recommend discontinuation of nonessential meds such as this if trend persists 5. Patient should receive her pain meds from her primary physician as an outpatient  Discharge Diagnoses:  Principal Problem:   HCAP (healthcare-associated pneumonia) Active Problems:   Generalized anxiety disorder   Tobacco user   Depression   Small cell lung cancer, left ([place])   Brain metastasis ([place])   Alcohol dependence ([place])   COPD with acute exacerbation ([employer] [place] [employer])   Hypertensive urgency   Discharge Condition: Guarded  Diet recommendation: Heart healthy  Filed Weights   03/25/17 1604 03/25/17 2000 03/25/17 2223  Weight: 55.3 kg (122 lb) 54.4 kg (120 lb) 56 kg (123 lb 7.3 oz)    History of present illness:  46 Recently diagnosed extensive stage small cell lung cancer with brain meds, declined palliative chemotherapy, received XRT, on home hospice, admitted with cough, acute on chronic hypoxia respiratory failure, o2 sat's In the 70's on room air, with dyspnea when talks She is on home hospice, but consider intubation if needed, palliative care consulted, goals of care continued  Hospital Course:  chronic hypoxic respiratory failure due to HCAP /copd exacerbation/extensive stage  small cell lung cancer - Pt presents with 4 days of progressive SOB and productive cough, she is symptomatic at rest , hypoxic in the 70's on room air.  desat screen is pending - CTA no PE, "New patchy bilateral peripheral airspace opacities throughout both lungs, most prominent at the right upper lobe, concerning for multifocal pneumonia." Afebrile and no leukocytosis--do not feel she has severe sepsis. respiratory viral penal negative, mrsa screening negative , urine strep pneumo antigen negative, sputum sample showed gram-positive cocci which would be covered by Levaquin - She was started on empiric vancomycin and cefepime in ED, continue cefepime, d/c vanc 5/8-->all abx consolidated to po levaquin changed solumedrol -->prednisone 40 daily as improved wheeze  2. Small cell lung cancer, metastatic  - Pt has SCLC involving LUL with brain metastases , on steroids and keppra which is continued - She is followed by oncology and has been treated with radiation, but declined the palliative chemotherapy that was recommended by oncologist  - She is under the care of home hospice but wants to be full code, confirms that she wants to be intubated if necessary to prolong her life  - Palliative care consultation appreciated  3. Hypertensive urgency  - Diastolic pressures sustaining in 100-range  - Treat with prn labetalol IVP's for now D/c home meds florinef as an outpatient as per input of outpatient physicians  4. Hypokalemia  - Serum potassium is 2.6 on admission - replace k,  Magnesium level wnl  - d/c telemetry monitoring  repeat chem panel in am   5. Depression, anxiety  - Appears stable  - Continue Effexor  6. Alcohol dependence  -  No withdrawal s/s on admission  - Monitor with CIWA and prn Ativan   7. Current smoker  - Nicotine patch provided at pt's request    Consultations:  Palliative care  Discharge Exam: Vitals:   03/28/17 0544 03/28/17 0842  BP: 118/69    Pulse: 100 (!) 109  Resp: 18 20  Temp: 99.6 F (37.6 C)     Alert pleasant oriented no apparent distress tolerating diet Did not desat with ambulation early No chest pain No nausea No lower extremity edema  Discharge Instructions    Current Discharge Medication List    CONTINUE these medications which have NOT CHANGED   Details  albuterol (PROAIR HFA) 108 (90 Base) MCG/ACT inhaler INHALE 2 PUFFS INTO LUNGS EVERY 4 HOURS AS NEEDED FOR COUGH, SHORTNESS OF BREATH, OR WHEEZING Qty: 9 each, Refills: 1   Associated Diagnoses: Tobacco user    fludrocortisone (FLORINEF) 0.1 MG tablet Take 3 tablets (0.3 mg total) by mouth daily. Qty: 90 tablet, Refills: 0    guaifenesin (ROBITUSSIN) 100 MG/5ML syrup Give 10 cc by mouth every 6 hours as needed for cough    ibuprofen (ADVIL,MOTRIN) 200 MG tablet Take 400 mg by mouth every 6 (six) hours as needed for mild pain.    ipratropium-albuterol (DUONEB) 0.5-2.5 (3) MG/3ML SOLN Take 3 mLs by nebulization every 4 (four) hours as needed (SOB and Dyspnea).    levETIRAcetam (KEPPRA) 500 MG tablet Take 1 tablet (500 mg total) by mouth 2 (two) times daily. Qty: 60 tablet, Refills: 0    levofloxacin (LEVAQUIN) 500 MG tablet Take 500 mg by mouth daily.    midodrine (PROAMATINE) 10 MG tablet Take 1 tablet (10 mg total) by mouth 3 (three) times daily with meals. Qty: 90 tablet, Refills: 0    oxyCODONE (OXY IR/ROXICODONE) 5 MG immediate release tablet Take 1 tablet (5 mg total) by mouth every 4 (four) hours as needed for moderate pain. Qty: 12 tablet, Refills: 0    polyvinyl alcohol (ARTIFICIAL TEARS) 1.4 % ophthalmic solution Place 1 drop into both eyes every 6 (six) hours as needed for dry eyes.     predniSONE (DELTASONE) 20 MG tablet Take 40 mg by mouth daily with breakfast.    !! venlafaxine XR (EFFEXOR-XR) 150 MG 24 hr capsule TAKE 1 CAPSULE BY MOUTH ONCE DAILY ALONG WITH '75MG'$  CAPSULE Qty: 90 capsule, Refills: 3   Associated Diagnoses:  Depression, unspecified depression type    zolpidem (AMBIEN CR) 12.5 MG CR tablet Take 1 tablet (12.5 mg total) by mouth at bedtime as needed for sleep. Qty: 30 tablet, Refills: 2    acetaminophen (TYLENOL) 325 MG tablet Take 2 tablets (650 mg total) by mouth every 6 (six) hours as needed for mild pain (or Fever >/= 101). Qty: 60 tablet, Refills: 0    oxyCODONE-acetaminophen (PERCOCET/ROXICET) 5-325 MG tablet Take 0.5-1 tablets by mouth every 6 (six) hours as needed for severe pain. Qty: 15 tablet, Refills: 0    !! venlafaxine XR (EFFEXOR-XR) 75 MG 24 hr capsule TAKE 1 CAPSULE BY MOUTH ONCE DAILY ALONG WITH '150MG'$  CAPSULE Qty: 90 capsule, Refills: 3   Associated Diagnoses: Depression, unspecified depression type     !! - Potential duplicate medications found. Please discuss with provider.     No Known Allergies    The results of significant diagnostics from this hospitalization (including imaging, microbiology, ancillary and laboratory) are listed below for reference.    Significant Diagnostic Studies: Dg Chest 2 View  Result Date: 03/25/2017 CLINICAL  DATA:  Worsening productive cough and shortness of breath. History of lung cancer EXAM: CHEST  2 VIEW COMPARISON:  01/21/2017 FINDINGS: Left upper lobe mass has decreased in the interval. There is bibasilar airspace disease, right greater than left, suspicious for pneumonia. The cardio pericardial silhouette is enlarged. Hyperexpansion suggests emphysema. Probable infarct noted left proximal humerus, stable. Telemetry leads overlie the chest. IMPRESSION: Interval decrease in left upper lobe mass. Emphysema with patchy airspace disease in the lower lobes bilaterally suggesting pneumonia. Electronically Signed   By: Misty Stanley M.D.   On: 03/25/2017 16:56   Ct Angio Chest Pe W/cm &/or Wo Cm  Result Date: 03/25/2017 CLINICAL DATA:  Acute onset of productive cough and shortness of breath. Current history of lung cancer. Initial encounter.  EXAM: CT ANGIOGRAPHY CHEST WITH CONTRAST TECHNIQUE: Multidetector CT imaging of the chest was performed using the standard protocol during bolus administration of intravenous contrast. Multiplanar CT image reconstructions and MIPs were obtained to evaluate the vascular anatomy. CONTRAST:  100 mL of Isovue 370 IV contrast COMPARISON:  CT of the chest performed 01/19/2017, and chest radiograph performed earlier today at 4:46 p.m. FINDINGS: Cardiovascular: The heart is normal in size. The thoracic aorta is grossly unremarkable. The great vessels are unremarkable in appearance. Mediastinum/Nodes: The patient's left upper lobe mass has decreased in size, now measuring approximately 8.5 x 4.1 x 2.7 cm. This extends to the left hilum, and as before, mass extends into the left upper pulmonary vein. This partially encases the left main pulmonary artery. No mediastinal lymphadenopathy is seen. No pericardial effusion is identified. The visualized portions of the thyroid gland are unremarkable. No axillary lymphadenopathy is appreciated. Lungs/Pleura: There are new patchy bilateral peripheral airspace opacities throughout both lungs, most prominent at the right upper lobe, concerning for multifocal pneumonia. No pleural effusion or pneumothorax is seen. No additional masses are identified. Underlying emphysema is noted. Upper Abdomen: The visualized portions of the liver and spleen are grossly unremarkable. The visualized portions of the adrenal glands and kidneys are within normal limits. A tiny hiatal hernia is noted. Musculoskeletal: No acute osseous abnormalities are identified. There is mild chronic loss of height at the superior endplate of T9. The visualized musculature is unremarkable in appearance. Review of the MIP images confirms the above findings. IMPRESSION: 1. New patchy bilateral peripheral airspace opacities throughout both lungs, most prominent at the right upper lobe, concerning for multifocal pneumonia. 2.  Left upper lobe lung mass has decreased in size, now measuring approximately 8.5 x 4.1 x 2.7 cm. This extends into the left hilum, and as before, into the left upper pulmonary vein. There is partial encasement of the left main pulmonary artery. 3. Underlying bilateral emphysema noted. 4. Tiny hiatal hernia seen. 5. Mild chronic loss of height at the superior endplate of T9. Electronically Signed   By: Garald Balding M.D.   On: 03/25/2017 19:29    Microbiology: Recent Results (from the past 240 hour(s))  Blood Culture (routine x 2)     Status: None (Preliminary result)   Collection Time: 03/25/17  7:40 PM  Result Value Ref Range Status   Specimen Description BLOOD RIGHT HAND  Final   Special Requests IN PEDIATRIC BOTTLE BCAV  Final   Culture   Final    NO GROWTH 1 DAY Performed at [facility], [address] [place], [place] [address]    Report Status PENDING  Incomplete  Blood Culture (routine x 2)     Status: None (  Preliminary result)   Collection Time: 03/25/17  7:45 PM  Result Value Ref Range Status   Specimen Description BLOOD LEFT AC  Final   Special Requests BOTTLES DRAWN AEROBIC AND ANAEROBIC BCAV  Final   Culture   Final    NO GROWTH 1 DAY Performed at [facility], [address] [place], [place] [address]    Report Status PENDING  Incomplete  Surgical PCR screen     Status: None   Collection Time: 03/26/17 11:00 AM  Result Value Ref Range Status   MRSA, PCR NEGATIVE NEGATIVE Final   Staphylococcus aureus NEGATIVE NEGATIVE Final    Comment:        The Xpert SA Assay (FDA approved for NASAL specimens in patients over 37 years of age), is one component of a comprehensive surveillance program.  Test performance has been validated by [facility] for patients greater than or equal to 14 year old. It is not intended to diagnose infection nor to guide or monitor treatment.   Respiratory Panel by PCR     Status: None   Collection Time: 03/26/17 11:29 AM   Result Value Ref Range Status   Adenovirus NOT DETECTED NOT DETECTED Final   Coronavirus 229E NOT DETECTED NOT DETECTED Final   Coronavirus HKU1 NOT DETECTED NOT DETECTED Final   Coronavirus NL63 NOT DETECTED NOT DETECTED Final   Coronavirus OC43 NOT DETECTED NOT DETECTED Final   Metapneumovirus NOT DETECTED NOT DETECTED Final   Rhinovirus / Enterovirus NOT DETECTED NOT DETECTED Final   Influenza A NOT DETECTED NOT DETECTED Final   Influenza B NOT DETECTED NOT DETECTED Final   Parainfluenza Virus 1 NOT DETECTED NOT DETECTED Final   Parainfluenza Virus 2 NOT DETECTED NOT DETECTED Final   Parainfluenza Virus 3 NOT DETECTED NOT DETECTED Final   Parainfluenza Virus 4 NOT DETECTED NOT DETECTED Final   Respiratory Syncytial Virus NOT DETECTED NOT DETECTED Final   Bordetella pertussis NOT DETECTED NOT DETECTED Final   Chlamydophila pneumoniae NOT DETECTED NOT DETECTED Final   Mycoplasma pneumoniae NOT DETECTED NOT DETECTED Final    Comment: Performed at [facility], [place] [address], [place], [place] [address]  Culture, sputum-assessment     Status: None   Collection Time: 03/27/17  9:11 AM  Result Value Ref Range Status   Specimen Description SPUTUM  Final   Special Requests Immunocompromised  Final   Sputum evaluation THIS SPECIMEN IS ACCEPTABLE FOR SPUTUM CULTURE  Final   Report Status 03/27/2017 FINAL  Final  Culture, respiratory (NON-Expectorated)     Status: None (Preliminary result)   Collection Time: 03/27/17  9:11 AM  Result Value Ref Range Status   Specimen Description SPUTUM  Final   Special Requests Immunocompromised Reflexed from [id number]  Final   Gram Stain   Final    FEW WBC PRESENT, PREDOMINANTLY PMN FEW SQUAMOUS EPITHELIAL CELLS PRESENT FEW YEAST RARE GRAM POSITIVE COCCI IN PAIRS RARE GRAM POSITIVE RODS Performed at [facility], [place] [address] [place],  [address]    Culture PENDING  Incomplete   Report Status PENDING  Incomplete      Labs: Basic Metabolic Panel:  Recent Labs Lab 03/25/17 1636 03/25/17 1731 03/26/17 0922 03/27/17 0510 03/28/17 0453  NA 138  --  140 139 137  K 2.6*  --  2.6* 4.0 3.8  CL 98*  --  103 105 97*  CO2 29  --  27 27 33*  GLUCOSE 115*  --  187* 158* 78  BUN 21*  --  '20 20 13  '$ CREATININE 0.47  --  0.49 0.35* 0.40*  CALCIUM 9.3  --  8.8* 8.6* 8.4*  MG  --  2.0 1.8  --   --    Liver Function Tests:  Recent Labs Lab 03/25/17 1636  AST 13*  ALT 21  ALKPHOS 69  BILITOT 0.5  PROT 6.9  ALBUMIN 2.7*   No results for input(s): LIPASE, AMYLASE in the last 168 hours. No results for input(s): AMMONIA in the last 168 hours. CBC:  Recent Labs Lab 03/25/17 1636 03/27/17 0510 03/28/17 0453  WBC 7.1 5.2 9.6  NEUTROABS 6.6 4.7 8.6*  HGB 11.8* 10.0* 10.7*  HCT 34.9* 30.0* 32.2*  MCV 92.8 93.2 93.9  PLT 247 207 221   Cardiac Enzymes:  Recent Labs Lab 03/25/17 1636  TROPONINI <0.03   BNP: BNP (last 3 results) No results for input(s): BNP in the last 8760 hours.  ProBNP (last 3 results) No results for input(s): PROBNP in the last 8760 hours.  CBG: No results for input(s): GLUCAP in the last 168 hours.     SignedNita Sells MD   [facility] 03/28/2017, 10:49 AM

## 2017-03-28 NOTE — Progress Notes (Signed)
SATURATION QUALIFICATIONS: (This note is used to comply with regulatory documentation for home oxygen)  Patient Saturations on Room Air at Rest = 92%  Patient Saturations on Room Air while Ambulating = 88%   Please briefly explain why patient needs home oxygen:

## 2017-03-28 NOTE — Progress Notes (Signed)
Pkt declined O2, states "There is no safe place for it in the house and still smoke. Pt also states that she will not need [facility]. Pt is discharging home with Hospice and [facility]."

## 2017-03-28 NOTE — Progress Notes (Signed)
Pt's sitter will transport her home. Pt is followed by [facility].

## 2017-03-28 NOTE — Progress Notes (Signed)
[facility] Liaison RN Visit  Visited with patient in room to confirm discharge today. Currently having nebulizer treatment and Dr. Verlon Au present. Pt denies pain, denies any current needs. Pt denies any needs at home. A female visitor in the room asks if there are sitters that can come and stay with the patient through [facility]. Explained hospice services for symptom management and comfort care through team approach and not continuous care and recommended asking for agency list if it was felt she needed additional caregivers at home, though the patient kept repeating she was fine and did not need additional care. Dr. Verlon Au asked about O2 in the home. Pt states she would have to talk to her roommate about O2. [facility] past experience with pt is that she did not want O2 in the home as she and her roommate both wish to continue to smoke, which the pt confirmed today.  Should O2 be desired. [facility] will arrange.  [facility] is available for any additional discharge needs.  Thank you, Margaretmary Eddy, [facility] Liaison [phone]  [facility] Liaisons are on [place].

## 2017-03-28 NOTE — Progress Notes (Signed)
PHARMACY NOTE:  ANTIMICROBIAL RENAL DOSAGE ADJUSTMENT  Current antimicrobial regimen includes a mismatch between antimicrobial dosage and estimated renal function.  As per policy approved by the Pharmacy & Therapeutics and Medical Executive Committees, the antimicrobial dosage will be adjusted accordingly.  Current antimicrobial dosage:  Levaquin 500 mg PO daily  Indication: Pneumonia  Renal Function:  Estimated Creatinine Clearance: 76.9 mL/min (A) (by C-G formula based on SCr of 0.4 mg/dL (L)).     Antimicrobial dosage has been changed to:  Levaquin 750 mg PO daily  Additional comments:   Thank you for allowing pharmacy to be a part of this patient's care.  Gretta Arab PharmD, BCPS Pager [phone] 03/28/2017 8:20 AM

## 2017-03-29 LAB — CULTURE, RESPIRATORY

## 2017-03-29 LAB — CULTURE, RESPIRATORY W GRAM STAIN

## 2017-03-31 LAB — CULTURE, BLOOD (ROUTINE X 2)
CULTURE: NO GROWTH
CULTURE: NO GROWTH

## 2017-04-01 ENCOUNTER — Telehealth: Payer: Self-pay | Admitting: Family Medicine

## 2017-04-01 NOTE — Telephone Encounter (Signed)
PT CALLING BACK ABOUT RX I ADVISED HER IT TAKES UP TO 48-72 HOURS FOR A RESPONSE PT UNDERSTANDS

## 2017-04-01 NOTE — Telephone Encounter (Signed)
Pt lost her zanax .'5mg'$  and is needing a refill of at least 10 day supply until she can get them refilled   Best number [phone]

## 2017-04-01 NOTE — Telephone Encounter (Signed)
09/11/16 last ov , meds off list

## 2017-04-02 MED ORDER — ALPRAZOLAM 0.5 MG PO TABS: 0.5000 mg | ORAL_TABLET | Freq: Every evening | ORAL | 0 refills | Status: DC | PRN

## 2017-04-02 NOTE — Telephone Encounter (Addendum)
Please clarify that she means Xanax. It looks like she's been taking lorazepam (Ativan).

## 2017-04-02 NOTE — Telephone Encounter (Signed)
Spoke with pharmacy to clarify order for Xanax because medication was not on current medication list. Pharmacy did clarify that pt did have an active order for this medication but it was too early to fill because it was last filled on 03/15/17. Pt is requesting just enough medication to get her through until she is able to get it refilled again. Is this possible ?

## 2017-04-02 NOTE — Telephone Encounter (Signed)
Spoke with patient and advised that she needs to be seen in the office because she has not been seen for a while. Pt advised that she needs enough Xanax to get her through until they are able to fill the rest on the 26th of May.

## 2017-04-02 NOTE — Telephone Encounter (Signed)
Please call in refill of Xanax and ask pharmacist to fill early (lung cancer with brain mets)

## 2017-04-03 ENCOUNTER — Telehealth: Payer: Self-pay

## 2017-04-03 NOTE — Telephone Encounter (Signed)
Spoke with pharmacy staff and they are filling Xanax 0.'5mg'$  10 tablets. Override reason for filling early d/t patient having lung cancer and brain mets.

## 2017-04-03 NOTE — Telephone Encounter (Signed)
Attempted to call patient and let her know that her prescription for Xanax was filled at the pharmacy and ready for her to pick up. Voicemail box is full and unable to take messages at this time.

## 2017-04-04 NOTE — Telephone Encounter (Signed)
Called to [facility] 

## 2017-04-10 ENCOUNTER — Other Ambulatory Visit: Payer: Self-pay | Admitting: Family Medicine

## 2017-04-14 ENCOUNTER — Other Ambulatory Visit: Payer: Self-pay | Admitting: Internal Medicine

## 2017-04-18 NOTE — Progress Notes (Signed)
[place]  [phone] [phone]  Clinic Follow up Note   Patient Care Team: Wardell Honour, MD as PCP - General (Family Medicine) Ashok Pall, MD as Consulting Physician (Neurosurgery) 04/24/2017  SUMMARY OF ONCOLOGIC HISTORY: Oncology History   Cancer Staging Small cell lung cancer, left [facility]) Staging form: Lung, AJCC 8th Edition - Clinical stage from 01/21/2017: Stage IVA (cT4, cNX, pM1a) - Signed by Truitt Merle, MD on 02/12/2017       Small cell lung cancer, left ([place])   01/18/2017 Imaging     MR brain w wo contrast showed: Three total mass lesions as above, highly concerning for intracranial metastatic disease. Extensive vasogenic edema about the lesion within the right frontal lobe with associated 14 mm of right-to-left shift. Lateral ventricles are partially effaced without hydrocephalus or evidence for ventricular trapping at this time.      01/19/2017 Imaging    CT chest, abdomen and pelvis with contrast  IMPRESSION: 1. Large (at least 8 cm) enhancing left upper lobe lung mass encasing the left main pulmonary artery and invading the left pulmonary veins and left atrium. There is extension into the distal left mainstem bronchus. 2. Nonspecific 12 mm subcutaneous nodule in the anterior abdominal wall, may be soft tissue metastasis or sebaceous cyst. 3. There is otherwise no evidence of primary or metastatic malignancy in the chest, abdomen, or pelvis. 4. Moderate emphysema.      01/21/2017 Imaging    Diagnosis 1. Lung, biopsy, Left Upper Lobe - NECROTIC MATERIAL. 2. Lung, biopsy, Left Upper Lobe #2 - SMALL CELL CARCINOMA. - SEE COMMENT. 3. Lung, biopsy, Left Upper Lobe #3 - SMALL CELL CARCINOMA. - SEE COMMENT. 4. Lung, biopsy, Left Upper Lobe #4 - SMALL CELL CARCINOMA. - SEE COMMENT.       01/22/2017 Initial Diagnosis    Small cell lung cancer, left ([place])     01/25/2017 - 02/07/2017 Radiation Therapy    Site/dose:      1) Whole Brain: 30 Gy in 10 fractions 2) Left Lung: 30 Gy in 10 fractions      History of present illness (01/20/2017):  Erika Hunter is a 46 yo Caucasian female, with past medical history of 20 pack year smoking, otherwise healthy, presented with one week history of headache and some difficulty at work for routine jobs, she presented to ED on 3/2, brain MRI showed 2 large masses, one in the right frontal lobe, 1 in the left or subtotal, and another small falcine mass on the left, highly suspicious for brain metastasis. CT chest, abdomen and pelvis showed a large left upper lobe mass, suspicious for primary lung cancer. She was admitted to hospital, currently on dexamethasone 6 mg 4 times a day, her headache has much improved. She appears to be sleepy when I saw her, but is oriented, and answered questions appropriately. Review of system also is positive for mild fatigue, decrease in appetite lately, and a total of 80 pounds weight loss in the past year.  CURRENTLY THERAPY: Supportive care  INTERVAL HISTORY:  Erika Hunter returns for follow-up. She has been under hospice care, and requested to see me due to her worsening headaches. She was discharged from rehabilitation, currently lives with a friend. She presents to the clinic today with family friend. She reports to having a "whooshing' sound on the right side of her head and gives her a strong headache on her right temporal lobe. It help to lay down. She is using Oxycodone 3  times a day. No facial leg or arm weakness, no chest discomfort or breathing issue. Her eating is fair. She walks around the house around her room. She is done with rehab. She has not been cleared to drive. She able to do her basic living needs. She has not been back to radiation oncology. She is able to walk fine. Hospice still sees her every 2 weeks. She has papers for a temporary release from Hospice. She will sign up with hospice after her radiation    REVIEW OF SYSTEMS:    Constitutional: Denies fevers, chills, (+) weight loss  Eyes: Denies blurriness of vision Ears, nose, mouth, throat, and face: Denies mucositis or sore throat Respiratory: Denies cough, dyspnea or wheezes Cardiovascular: Denies palpitation, chest discomfort or lower extremity swelling Gastrointestinal:  Denies nausea, heartburn or change in bowel habits Skin: Denies abnormal skin rashes Lymphatics: Denies new lymphadenopathy or easy bruising Neurological:Denies numbness, tingling or new weaknesses (+) headaches Behavioral/Psych: Mood is stable, no new changes  All other systems were reviewed with the patient and are negative.  MEDICAL HISTORY:  Past Medical History:  Diagnosis Date  . Alcohol dependence ([place]) 01/24/2017  . Anxiety    panic disorder  . Asthma   . Brain cancer ([place])   . Cervical dysplasia 11/19/1988   S/P conization  . COPD (chronic obstructive pulmonary disease) ([place]) 01/24/2017  . Depression   . Hyperlipidemia   . Lung cancer ([place])   . Steatohepatitis    h/o heavy alcohol use, hepatitis profile negative for A, B    SURGICAL HISTORY: Past Surgical History:  Procedure Laterality Date  . Cold knife conization  Early 2000s   Pre-cancerous lesions  . DILATION AND CURETTAGE OF UTERUS    . LUNG BIOPSY Left 01/21/2017   Procedure: LUNG BIOPSY LEFT UPPER LOBE;  Surgeon: Grace Isaac, MD;  Location: [facility];  Service: Thoracic;  Laterality: Left;  Marland Kitchen VIDEO BRONCHOSCOPY N/A 01/21/2017   Procedure: VIDEO BRONCHOSCOPY;  Surgeon: Grace Isaac, MD;  Location: [facility] OR;  Service: Thoracic;  Laterality: N/A;    I have reviewed the social history and family history with the patient and they are unchanged from previous note.  ALLERGIES:  has No Known Allergies.  MEDICATIONS:  Current Outpatient Prescriptions  Medication Sig Dispense Refill  . albuterol (PROAIR HFA) 108 (90 Base) MCG/ACT inhaler INHALE 2 PUFFS INTO LUNGS EVERY 4 HOURS AS NEEDED FOR COUGH, SHORTNESS OF  BREATH, OR WHEEZING 9 each 1  . ALPRAZolam (XANAX) 0.5 MG tablet Take 1 tablet (0.5 mg total) by mouth at bedtime as needed for anxiety. 10 tablet 0  . fludrocortisone (FLORINEF) 0.1 MG tablet Take 3 tablets (0.3 mg total) by mouth daily. 90 tablet 0  . guaifenesin (ROBITUSSIN) 100 MG/5ML syrup Give 10 cc by mouth every 6 hours as needed for cough    . ibuprofen (ADVIL,MOTRIN) 200 MG tablet Take 400 mg by mouth every 6 (six) hours as needed for mild pain.    Marland Kitchen ipratropium-albuterol (DUONEB) 0.5-2.5 (3) MG/3ML SOLN Take 3 mLs by nebulization every 4 (four) hours as needed (SOB and Dyspnea).    . levETIRAcetam (KEPPRA) 500 MG tablet Take 1 tablet (500 mg total) by mouth 2 (two) times daily. 60 tablet 0  . levofloxacin (LEVAQUIN) 750 MG tablet Take 1 tablet (750 mg total) by mouth daily. 5 tablet 0  . midodrine (PROAMATINE) 10 MG tablet Take 1 tablet (10 mg total) by mouth 3 (three) times daily with meals. [place]  tablet 0  . oxyCODONE (OXY IR/ROXICODONE) 5 MG immediate release tablet Take 1 tablet (5 mg total) by mouth every 4 (four) hours as needed for moderate pain. 90 tablet 0  . polyvinyl alcohol (ARTIFICIAL TEARS) 1.4 % ophthalmic solution Place 1 drop into both eyes every 6 (six) hours as needed for dry eyes.     Marland Kitchen venlafaxine XR (EFFEXOR-XR) 150 MG 24 hr capsule TAKE 1 CAPSULE BY MOUTH ONCE DAILY ALONG WITH 75MG  CAPSULE 90 capsule 3  . venlafaxine XR (EFFEXOR-XR) 75 MG 24 hr capsule TAKE 1 CAPSULE BY MOUTH ONCE DAILY ALONG WITH 150MG  CAPSULE 90 capsule 3  . zolpidem (AMBIEN CR) 12.5 MG CR tablet Take 1 tablet (12.5 mg total) by mouth at bedtime as needed for sleep. 30 tablet 2  . acetaminophen (TYLENOL) 325 MG tablet Take 2 tablets (650 mg total) by mouth every 6 (six) hours as needed for mild pain (or Fever >/= 101). (Patient not taking: Reported on 03/21/2017) 60 tablet 0  . potassium chloride SA (K-DUR,KLOR-CON) 20 MEQ tablet Take 1 tab three times daily x 3 days then daily x 3 weeks. 30 tablet 0    No current facility-administered medications for this visit.     PHYSICAL EXAMINATION: ECOG PERFORMANCE STATUS: 2 - Symptomatic, <50% confined to bed  Vitals:   04/24/17 1349  BP: 114/81  Pulse: 82  Resp: 18  Temp: 98.6 F (37 C)   Filed Weights   04/24/17 1349  Weight: 118 lb 11.2 oz (53.8 kg)    GENERAL:alert, no distress and comfortable SKIN: skin color, texture, turgor are normal, no rashes or significant lesions EYES: normal, Conjunctiva are pink and non-injected, sclera clear OROPHARYNX:no exudate, no erythema and lips, buccal mucosa, and tongue normal  NECK: supple, thyroid normal size, non-tender, without nodularity LYMPH:  no palpable lymphadenopathy in the cervical, axillary or inguinal LUNGS: clear to auscultation and percussion with normal breathing effort HEART: regular rate & rhythm and no murmurs and no lower extremity edema ABDOMEN:abdomen soft, non-tender and normal bowel sounds Musculoskeletal:no cyanosis of digits and no clubbing  NEURO: alert & oriented x 3 with fluent speech, no focal motor/sensory deficits  LABORATORY DATA:  I have reviewed the data as listed CBC Latest Ref Rng & Units 04/24/2017 03/28/2017 03/27/2017  WBC 3.9 - 10.3 10e3/uL 6.8 9.6 5.2  Hemoglobin 11.6 - 15.9 g/dL 12.9 10.7(L) 10.0(L)  Hematocrit 34.8 - 46.6 % 38.8 32.2(L) 30.0(L)  Platelets 145 - 400 10e3/uL 376 221 207     CMP Latest Ref Rng & Units 04/24/2017 03/28/2017 03/27/2017  Glucose 70 - 140 mg/dl 78 78 158(H)  BUN 7.0 - 26.0 mg/dL 5.2(L) 13 20  Creatinine 0.6 - 1.1 mg/dL 0.6 0.40(L) 0.35(L)  Sodium 136 - 145 mEq/L 138 137 139  Potassium 3.5 - 5.1 mEq/L 2.2(LL) 3.8 4.0  Chloride 101 - 111 mmol/L - 97(L) 105  CO2 22 - 29 mEq/L 34(H) 33(H) 27  Calcium 8.4 - 10.4 mg/dL 9.7 8.4(L) 8.6(L)  Total Protein 6.4 - 8.3 g/dL 6.6 - -  Total Bilirubin 0.20 - 1.20 mg/dL 0.46 - -  Alkaline Phos 40 - 150 U/L 97 - -  AST 5 - 34 U/L 11 - -  ALT 0 - 55 U/L 7 - -   PATHOLOGY REPORT   Diagnosis 01/21/2017 BRONCHIAL BRUSHING (A) LEFT UPPER LOBE (SPECIMEN 1 OF 1 COLLECTED 01/21/2017) MALIGNANT CELLS PRESENT, CONSISTENT WITH SMALL CELL CARCINOMA. SEE COMMENT. COMMENT: PLEASE ALSO SEE THE PATIENT'S CONCURRENT SURGICAL SPECIMEN, [phone].  Diagnosis 01/21/2017 1. Lung, biopsy, Left Upper Lobe - NECROTIC MATERIAL. 2. Lung, biopsy, Left Upper Lobe #2 - SMALL CELL CARCINOMA. - SEE COMMENT. 3. Lung, biopsy, Left Upper Lobe #3 - SMALL CELL CARCINOMA. - SEE COMMENT. 4. Lung, biopsy, Left Upper Lobe #4 - SMALL CELL CARCINOMA. - SEE COMMENT. Microscopic Comment 2. - 4. Dr Tresa Moore has reviewed parts 2 - 4 and concurs with this interpretation. Dr Servando Snare was paged on 01/22/2017. Additional testing can be performed upon clinician request.   RADIOGRAPHIC STUDIES: I have personally reviewed the radiological images as listed and agreed with the findings in the report. No results found.   CT Angio Chest 03/25/17 IMPRESSION: 1. New patchy bilateral peripheral airspace opacities throughout both lungs, most prominent at the right upper lobe, concerning for multifocal pneumonia. 2. Left upper lobe lung mass has decreased in size, now measuring approximately 8.5 x 4.1 x 2.7 cm. This extends into the left hilum, and as before, into the left upper pulmonary vein. There is partial encasement of the left main pulmonary artery. 3. Underlying bilateral emphysema noted. 4. Tiny hiatal hernia seen. 5. Mild chronic loss of height at the superior endplate of T9.  MR brain w wo contrast 01/18/2017 Three total mass lesions as above, highly concerning for intracranial metastatic disease. Extensive vasogenic edema about the lesion within the right frontal lobe with associated 14 mm of right-to-left shift. Lateral ventricles are partially effaced without hydrocephalus or evidence for ventricular trapping at this time.  CT chest, abdomen and pelvis with contrast on 01/19/2017 IMPRESSION: 1.  Large (at least 8 cm) enhancing left upper lobe lung mass encasing the left main pulmonary artery and invading the left pulmonary veins and left atrium. There is extension into the distal left mainstem bronchus. 2. Nonspecific 12 mm subcutaneous nodule in the anterior abdominal wall, may be soft tissue metastasis or sebaceous cyst. 3. There is otherwise no evidence of primary or metastatic malignancy in the chest, abdomen, or pelvis. 4. Moderate emphysema. These results will be called to the ordering clinician or representative by the Radiologist Assistant, and communication documented in the PACS or zVision Dashboard.   ASSESSMENT & PLAN:  46 y.o.  female with 40 PY smoking history, presented with headaches and difficulty on performingroutine jobs, fatigue and weight loss.   1. 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 She received whole brain radiation for brain metastasis. -Due to her worsening headaches, Will do repeat brian scan MRI in the next 1-2 days  -If she has worsening brain metastasis, and is a candidate for S/P RT, I'll refer her back to radiation oncology  2. Multiple brain mets (3) with vasogenic edema, and midline shift -She was previously weaned off steroids, due to her recurrent headaches, I called in dexamethasone yesterday -dexamethasone made her headaches worse and she stopped on her own   3. History of anxiety and panic attack  4. Anorexia, improved 5. She is DNR/DNI   Plan -Urgent Brain MRI w wo contrast within the next 1-2 days  -Refill Ambien and oxycodone -She will temporarily [place] from hospice due to the scant and possible needs for radiation  -Will call pt with results of scan -f/u as needed   All questions were answered. The patient knows to call the clinic with any problems, questions or concerns. No barriers to  learning was detected. I spent 25 minutes counseling the patient face  to face. The total time spent in the appointment was 30 minutes and more than 50% was on counseling and review of test results  This document serves as a record of services personally performed by Truitt Merle, MD. It was created on her behalf by Joslyn Devon, a trained medical scribe. The creation of this record is based on the scribe's personal observations and the provider's statements to them. This document has been checked and approved by the attending provider.      Truitt Merle, MD 04/24/2017

## 2017-04-22 ENCOUNTER — Telehealth: Payer: Self-pay | Admitting: *Deleted

## 2017-04-22 NOTE — Telephone Encounter (Signed)
Received fax from [facility] from 04/20/17 that hospice nurse had called & stated that pt had a horrible headache & hears heartbeat in her right ear.  Caller talked with Dr Alvy Bimler but no notes seen.  Discussed with Dr Burr Medico & she wanted to know if pt needed to be seen.  Unable to get answer at pt's home #.  Talked with [facility] RN/Hospice & she reports that Dr Alvy Bimler called in xanax to take 1/2 tab tid & that didn't work so Hospice MD called in dexamethasone 4 mg bid.  Pt has an appt with Dr Burr Medico on Wed & plans to come.

## 2017-04-24 ENCOUNTER — Ambulatory Visit (HOSPITAL_BASED_OUTPATIENT_CLINIC_OR_DEPARTMENT_OTHER): Payer: Medicaid Other | Admitting: Hematology

## 2017-04-24 ENCOUNTER — Other Ambulatory Visit: Payer: Self-pay | Admitting: *Deleted

## 2017-04-24 ENCOUNTER — Other Ambulatory Visit (HOSPITAL_BASED_OUTPATIENT_CLINIC_OR_DEPARTMENT_OTHER): Payer: Medicaid Other

## 2017-04-24 VITALS — BP 114/81 | HR 82 | Temp 98.6°F | Resp 18 | Ht 65.0 in | Wt 118.7 lb

## 2017-04-24 DIAGNOSIS — C3492 Malignant neoplasm of unspecified part of left bronchus or lung: Secondary | ICD-10-CM

## 2017-04-24 DIAGNOSIS — C7931 Secondary malignant neoplasm of brain: Secondary | ICD-10-CM | POA: Diagnosis not present

## 2017-04-24 DIAGNOSIS — C3412 Malignant neoplasm of upper lobe, left bronchus or lung: Secondary | ICD-10-CM | POA: Diagnosis not present

## 2017-04-24 DIAGNOSIS — D496 Neoplasm of unspecified behavior of brain: Secondary | ICD-10-CM

## 2017-04-24 LAB — COMPREHENSIVE METABOLIC PANEL
ALBUMIN: 3.2 g/dL — AB (ref 3.5–5.0)
ALK PHOS: 97 U/L (ref 40–150)
ALT: 7 U/L (ref 0–55)
AST: 11 U/L (ref 5–34)
Anion Gap: 11 mEq/L (ref 3–11)
BUN: 5.2 mg/dL — AB (ref 7.0–26.0)
CO2: 34 mEq/L — ABNORMAL HIGH (ref 22–29)
Calcium: 9.7 mg/dL (ref 8.4–10.4)
Chloride: 93 mEq/L — ABNORMAL LOW (ref 98–109)
Creatinine: 0.6 mg/dL (ref 0.6–1.1)
EGFR: >90 mL/min/{1.73_m2} (ref >90)
GLUCOSE: 78 mg/dL (ref 70–140)
Potassium: 2.2 mEq/L — CL (ref 3.5–5.1)
SODIUM: 138 meq/L (ref 136–145)
Total Bilirubin: 0.46 mg/dL (ref 0.20–1.20)
Total Protein: 6.6 g/dL (ref 6.4–8.3)

## 2017-04-24 LAB — CBC WITH DIFFERENTIAL/PLATELET
BASO%: 0.1 % (ref 0.0–2.0)
Basophils Absolute: 0 10*3/uL (ref 0.0–0.1)
EOS%: 0.1 % (ref 0.0–7.0)
Eosinophils Absolute: 0 10*3/uL (ref 0.0–0.5)
HCT: 38.8 % (ref 34.8–46.6)
HEMOGLOBIN: 12.9 g/dL (ref 11.6–15.9)
LYMPH%: 16.1 % (ref 14.0–49.7)
MCH: 30.8 pg (ref 25.1–34.0)
MCHC: 33.2 g/dL (ref 31.5–36.0)
MCV: 92.6 fL (ref 79.5–101.0)
MONO#: 0.3 10*3/uL (ref 0.1–0.9)
MONO%: 4.7 % (ref 0.0–14.0)
NEUT%: 79 % — ABNORMAL HIGH (ref 38.4–76.8)
NEUTROS ABS: 5.3 10*3/uL (ref 1.5–6.5)
Platelets: 376 10*3/uL (ref 145–400)
RBC: 4.19 10*6/uL (ref 3.70–5.45)
RDW: 15 % — AB (ref 11.2–14.5)
WBC: 6.8 10*3/uL (ref 3.9–10.3)
lymph#: 1.1 10*3/uL (ref 0.9–3.3)

## 2017-04-24 MED ORDER — OXYCODONE HCL 5 MG PO TABS: 5.0000 mg | ORAL_TABLET | ORAL | 0 refills | Status: DC | PRN

## 2017-04-24 MED ORDER — POTASSIUM CHLORIDE CRYS ER 20 MEQ PO TBCR: EXTENDED_RELEASE_TABLET | ORAL | 0 refills | Status: AC

## 2017-04-24 MED ORDER — ZOLPIDEM TARTRATE ER 12.5 MG PO TBCR: 12.5000 mg | EXTENDED_RELEASE_TABLET | Freq: Every evening | ORAL | 2 refills | Status: AC | PRN

## 2017-04-24 NOTE — Telephone Encounter (Signed)
Notified Hospice to d/c pt due to needing MRI brain & possible treatment.

## 2017-04-25 ENCOUNTER — Other Ambulatory Visit: Payer: Self-pay

## 2017-04-25 ENCOUNTER — Emergency Department (HOSPITAL_COMMUNITY): Payer: Medicaid Other

## 2017-04-25 ENCOUNTER — Inpatient Hospital Stay (HOSPITAL_COMMUNITY)
Admission: EM | Admit: 2017-04-25 | Discharge: 2017-04-27 | DRG: 054 | Disposition: A | Discharge status: Hospice | Payer: Medicaid Other | Attending: Family Medicine | Admitting: Family Medicine

## 2017-04-25 ENCOUNTER — Telehealth: Payer: Self-pay | Admitting: *Deleted

## 2017-04-25 DIAGNOSIS — Z66 Do not resuscitate: Secondary | ICD-10-CM | POA: Diagnosis present

## 2017-04-25 DIAGNOSIS — C7949 Secondary malignant neoplasm of other parts of nervous system: Secondary | ICD-10-CM | POA: Diagnosis not present

## 2017-04-25 DIAGNOSIS — Z79899 Other long term (current) drug therapy: Secondary | ICD-10-CM

## 2017-04-25 DIAGNOSIS — F1020 Alcohol dependence, uncomplicated: Secondary | ICD-10-CM | POA: Diagnosis present

## 2017-04-25 DIAGNOSIS — E876 Hypokalemia: Secondary | ICD-10-CM | POA: Diagnosis not present

## 2017-04-25 DIAGNOSIS — F05 Delirium due to known physiological condition: Secondary | ICD-10-CM | POA: Diagnosis not present

## 2017-04-25 DIAGNOSIS — C3490 Malignant neoplasm of unspecified part of unspecified bronchus or lung: Secondary | ICD-10-CM

## 2017-04-25 DIAGNOSIS — C7931 Secondary malignant neoplasm of brain: Principal | ICD-10-CM

## 2017-04-25 DIAGNOSIS — R4182 Altered mental status, unspecified: Secondary | ICD-10-CM

## 2017-04-25 DIAGNOSIS — G9340 Encephalopathy, unspecified: Secondary | ICD-10-CM | POA: Diagnosis present

## 2017-04-25 DIAGNOSIS — R32 Unspecified urinary incontinence: Secondary | ICD-10-CM | POA: Diagnosis present

## 2017-04-25 DIAGNOSIS — D630 Anemia in neoplastic disease: Secondary | ICD-10-CM | POA: Diagnosis present

## 2017-04-25 DIAGNOSIS — Z825 Family history of asthma and other chronic lower respiratory diseases: Secondary | ICD-10-CM

## 2017-04-25 DIAGNOSIS — C3492 Malignant neoplasm of unspecified part of left bronchus or lung: Secondary | ICD-10-CM

## 2017-04-25 DIAGNOSIS — Z923 Personal history of irradiation: Secondary | ICD-10-CM

## 2017-04-25 DIAGNOSIS — R569 Unspecified convulsions: Secondary | ICD-10-CM

## 2017-04-25 DIAGNOSIS — F419 Anxiety disorder, unspecified: Secondary | ICD-10-CM | POA: Diagnosis present

## 2017-04-25 DIAGNOSIS — Z515 Encounter for palliative care: Secondary | ICD-10-CM | POA: Diagnosis present

## 2017-04-25 DIAGNOSIS — D72829 Elevated white blood cell count, unspecified: Secondary | ICD-10-CM | POA: Diagnosis present

## 2017-04-25 DIAGNOSIS — J449 Chronic obstructive pulmonary disease, unspecified: Secondary | ICD-10-CM | POA: Diagnosis present

## 2017-04-25 DIAGNOSIS — F17210 Nicotine dependence, cigarettes, uncomplicated: Secondary | ICD-10-CM | POA: Diagnosis present

## 2017-04-25 DIAGNOSIS — G936 Cerebral edema: Secondary | ICD-10-CM | POA: Diagnosis present

## 2017-04-25 LAB — COMPREHENSIVE METABOLIC PANEL
ALBUMIN: 3.2 g/dL — AB (ref 3.5–5.0)
ALT: 10 U/L — ABNORMAL LOW (ref 14–54)
ANION GAP: 12 (ref 5–15)
AST: 20 U/L (ref 15–41)
Alkaline Phosphatase: 86 U/L (ref 38–126)
BUN: <5 mg/dL — ABNORMAL LOW (ref 6–20)
CHLORIDE: 96 mmol/L — AB (ref 101–111)
CO2: 26 mmol/L (ref 22–32)
Calcium: 9.1 mg/dL (ref 8.9–10.3)
Creatinine, Ser: 0.66 mg/dL (ref 0.44–1.00)
GFR calc non Af Amer: >60 mL/min (ref >60)
Glucose, Bld: 162 mg/dL — ABNORMAL HIGH (ref 65–99)
POTASSIUM: 2.6 mmol/L — AB (ref 3.5–5.1)
SODIUM: 134 mmol/L — AB (ref 135–145)
Total Bilirubin: 0.7 mg/dL (ref 0.3–1.2)
Total Protein: 6.1 g/dL — ABNORMAL LOW (ref 6.5–8.1)

## 2017-04-25 LAB — CBC WITH DIFFERENTIAL/PLATELET
BASOS PCT: 0 %
Basophils Absolute: 0 10*3/uL (ref 0.0–0.1)
Eosinophils Absolute: 0 10*3/uL (ref 0.0–0.7)
Eosinophils Relative: 0 %
HEMATOCRIT: 39.4 % (ref 36.0–46.0)
HEMOGLOBIN: 12.8 g/dL (ref 12.0–15.0)
LYMPHS ABS: 0.8 10*3/uL (ref 0.7–4.0)
LYMPHS PCT: 4 %
MCH: 30.1 pg (ref 26.0–34.0)
MCHC: 32.5 g/dL (ref 30.0–36.0)
MCV: 92.7 fL (ref 78.0–100.0)
MONOS PCT: 5 %
Monocytes Absolute: 1 10*3/uL (ref 0.1–1.0)
NEUTROS ABS: 17 10*3/uL — AB (ref 1.7–7.7)
Neutrophils Relative %: 91 %
Platelets: 436 10*3/uL — ABNORMAL HIGH (ref 150–400)
RBC: 4.25 MIL/uL (ref 3.87–5.11)
RDW: 15.1 % (ref 11.5–15.5)
WBC: 18.7 10*3/uL — ABNORMAL HIGH (ref 4.0–10.5)

## 2017-04-25 LAB — CK: Total CK: 48 U/L (ref 38–234)

## 2017-04-25 LAB — ETHANOL

## 2017-04-25 MED ORDER — ONDANSETRON HCL 4 MG PO TABS: 4.0000 mg | ORAL_TABLET | Freq: Four times a day (QID) | ORAL | Status: DC | PRN

## 2017-04-25 MED ORDER — POTASSIUM CHLORIDE 10 MEQ/100ML IV SOLN
10.0000 meq | INTRAVENOUS | Status: AC
  Administered 2017-04-25 – 2017-04-26 (×6): 10 meq via INTRAVENOUS
  Filled 2017-04-25 (×6): qty 100

## 2017-04-25 MED ORDER — ONDANSETRON HCL 4 MG/2ML IJ SOLN: 4.0000 mg | Freq: Four times a day (QID) | INTRAMUSCULAR | Status: DC | PRN

## 2017-04-25 MED ORDER — DEXAMETHASONE SODIUM PHOSPHATE 10 MG/ML IJ SOLN
10.0000 mg | Freq: Once | INTRAMUSCULAR | Status: AC
  Administered 2017-04-25: 10 mg via INTRAVENOUS
  Filled 2017-04-25: qty 1

## 2017-04-25 MED ORDER — SODIUM CHLORIDE 0.9 % IV SOLN
1000.0000 mg | Freq: Once | INTRAVENOUS | Status: AC
  Administered 2017-04-25: 1000 mg via INTRAVENOUS
  Filled 2017-04-25: qty 10

## 2017-04-25 MED ORDER — ENOXAPARIN SODIUM 40 MG/0.4ML ~~LOC~~ SOLN
40.0000 mg | Freq: Every day | SUBCUTANEOUS | Status: DC
  Administered 2017-04-25 – 2017-04-26 (×2): 40 mg via SUBCUTANEOUS
  Filled 2017-04-25 (×2): qty 0.4

## 2017-04-25 MED ORDER — SODIUM CHLORIDE 0.9 % IV SOLN
750.0000 mg | Freq: Two times a day (BID) | INTRAVENOUS | Status: DC
  Administered 2017-04-25 – 2017-04-27 (×4): 750 mg via INTRAVENOUS
  Filled 2017-04-25 (×5): qty 7.5

## 2017-04-25 MED ORDER — SODIUM CHLORIDE 0.9 % IV SOLN
75.0000 mL/h | INTRAVENOUS | Status: DC
  Administered 2017-04-25: 75 mL/h via INTRAVENOUS

## 2017-04-25 MED ORDER — DEXAMETHASONE SODIUM PHOSPHATE 4 MG/ML IJ SOLN
6.0000 mg | Freq: Four times a day (QID) | INTRAMUSCULAR | Status: DC
  Administered 2017-04-25 – 2017-04-27 (×6): 6 mg via INTRAVENOUS
  Filled 2017-04-25 (×6): qty 2

## 2017-04-25 MED ORDER — LORAZEPAM 2 MG/ML IJ SOLN: 1.0000 mg | INTRAMUSCULAR | Status: DC | PRN

## 2017-04-25 NOTE — ED Triage Notes (Signed)
Per [facility]: Patient found on floor at home by a roommate, unresponsive, unknown down time. Patient has hx stage 4 brain and lung cancer, was on hospice that may have been placed on hold for radiation therapy. Upon EMS arrival, pt found to have been incontinent of urine and had already vomited. Pt is not following commands, says "no" to everything, but does not appear to comprehend anything at this time. Pt is on Keppra. EMS VS: 125/89, HR 126 ST, 93% RA - 100% on 2L O2 [place], CBG 275. Patient alert, but not oriented x 4.

## 2017-04-25 NOTE — ED Notes (Signed)
Patient requested that friends present in the room change her and clean her after bowel movement. Patient also requested EMT leave the room while they cleaned her.

## 2017-04-25 NOTE — Telephone Encounter (Signed)
Late entry :  Informed Erika Hunter/Hospice that pt wants to withdraw from Hospice at this time.  Pt to have scans & possible treatment per Dr Burr Medico.

## 2017-04-25 NOTE — Consult Note (Signed)
Neurology Consultation Reason for Consult: Confusion Referring Physician: Long, J  CC: Confusion  History is obtained from: Chart review  HPI: Erika Hunter is a 46 y.o. 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 She has been in hospice, but was scheduled to get an MRI for consideration of palliative radiation. She has been titrating down on her steroids. Since arrival to the emergency department, she has been awake and interactive but does not follow commands and seems partially aphasic.  Apparently in the plan has been to give palliative radiation to try and reduce some the symptoms associated with her brain tumors, with the understanding that it is not going to change her ultimate outcome. She will have the plan of re-enrolling in hospice following radiation.      ROS:  Unable to obtain due to altered mental status.   Past Medical History:  Diagnosis Date  . Alcohol dependence ([place]) 01/24/2017  . Anxiety    panic disorder  . Asthma   . Brain cancer ([place])   . Cervical dysplasia 11/19/1988   S/P conization  . COPD (chronic obstructive pulmonary disease) ([place]) 01/24/2017  . Depression   . Hyperlipidemia   . Lung cancer ([place])   . Steatohepatitis    h/o heavy alcohol use, hepatitis profile negative for A, B     Family History  Problem Relation Age of Onset  . COPD Mother   . COPD Sister   . Cancer Sister 30       ovarian  . Diabetes Sister   . Mental illness Sister      Social History:  reports that she has been smoking.  She has a 25.00 pack-year smoking history. She has never used smokeless tobacco. She reports that she drinks alcohol. She reports that she does not use drugs.   Exam: Current vital signs: BP (!) 141/74   Pulse 92   Temp 98.9 F (37.2 C) (Rectal)   Resp 20   LMP 05/01/2012   SpO2 96%  Vital signs in last 24 hours: Temp:  [98.9 F (37.2 C)] 98.9 F (37.2 C)  (06/07 1749) Pulse Rate:  [92-111] 92 (06/07 2016) Resp:  [17-24] 20 (06/07 2030) BP: (129-164)/(74-98) 141/74 (06/07 2030) SpO2:  [96 %-100 %] 96 % (06/07 2016)   Physical Exam  Constitutional: Hair loss Psych: Not responsive to questions Eyes: No scleral injection HENT: No OP obstrucion Head: Normocephalic.  Cardiovascular: Normal rate and regular rhythm.  Respiratory: Effort normal and breath sounds normal to anterior ascultation GI: Soft.  No distension. There is no tenderness.  Skin: WDI  Neuro: Mental Status: Patient is Lethargic but arousable, with repeated vigorous stimulation, she did give me a thumbs up to command and answer no to question if she is in pain. Cranial Nerves: II: She fixates and tracks, but keeps her eyes to tightly close to fully assess blink to threat. Pupils are equal, round, and reactive to light.   III,IV, VI: EOMI without ptosis or diploplia.  V: Responds to touch on bilateral face VII: Facial movement is grossly symmetric Motor: She moves all extremities spontaneously, does not participate with formal testing. Sensory: She does respond to noxious stimuli in all 4 extremities. Cerebellar: She does not perform      I have reviewed labs in epic and the results pertinent to this consultation are: Potassium 2.6 Creatinine 0.6  I have reviewed the images obtained: CT  head-multiple mass lesions with edema  Impression: 46 year old female with metastatic brain cancer found with worsening mental status. Possibilities are increased edema due to weaning steroids and progression of disease versus prolonged postictal state. I have seen postictal states last longer in people with mass lesions and edema and therefore I think that this is possible. With her following commands, low suspicion for ongoing focal status.  Recommendations: 1) agree with additional dose of Keppra, then increase dose to 750 mg twice a day 2) MRI brain with/without contrast 3) if  she continues to be altered tomorrow, could consider EEG. 4) Decadron 10 mg 1 followed by 6mg  q6h  Roland Rack, MD [facility] [phone]  If 7pm- 7am, please page neurology on call as listed in [place].

## 2017-04-25 NOTE — ED Notes (Signed)
Patient's roommate and friend are at the bedside. Patient alert, but only oriented to person. Obeys some commands and nods in understanding at times, but uncooperative with ADLs and staying in the bed at times. Seizure pads on siderails, fall bracelet and socks on patient.

## 2017-04-25 NOTE — ED Provider Notes (Signed)
[facility] DEPT Provider Note   CSN: [id number] Arrival date & time: 04/25/17  1741     History   Chief Complaint Chief Complaint  Patient presents with  . Loss of Consciousness    HPI AHMYAH GIDLEY is a 46 y.o. female.  The history is provided by the EMS personnel and a friend. The history is limited by the condition of the patient.    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 Last known normal around 2 PM. Patient was found around 4 PM unconscious with vomiting and urinary incontinence. Patient has been confused and altered since that time. Patient was previously on hospice until yesterday, patient was taken off of hospice in order to obtain an MRI of the brain and for consideration for possible radiation. According to her last primary care physician note, patient is DO NOT RESUSCITATE. Patient's friend states that she was in her normal state of health prior to 2 PM. Denies recent fever, cough, congestion, dysuria. Patient has been complaining of headaches.  Past Medical History:  Diagnosis Date  . Alcohol dependence ([place]) 01/24/2017  . Anxiety    panic disorder  . Asthma   . Brain cancer ([place])   . Cervical dysplasia 11/19/1988   S/P conization  . COPD (chronic obstructive pulmonary disease) ([place]) 01/24/2017  . Depression   . Hyperlipidemia   . Lung cancer ([place])   . Steatohepatitis    h/o heavy alcohol use, hepatitis profile negative for A, B    Patient Active Problem List   Diagnosis Date Noted  . Seizure ([place]) 04/25/2017  . HCAP (healthcare-associated pneumonia) 03/25/2017  . COPD with acute exacerbation ([place]) 03/25/2017  . Hypertensive urgency 03/25/2017  . Hypokalemia   . Orthostatic hypotension 01/26/2017  . Goals of care, counseling/discussion   . Palliative care by specialist   . Alcohol dependence  ([place]) 01/24/2017  . Headache 01/24/2017  . COPD (chronic obstructive pulmonary disease) ([place]) 01/24/2017  . Brain metastasis ([place]) 01/23/2017  . Small cell lung cancer, left ([place]) 01/22/2017  . Lung mass   . Depression 09/11/2016  . History of abnormal cervical Pap smear 09/11/2016  . Hyperlipidemia with target low density lipoprotein (LDL) cholesterol less than 100 mg/dL 04/17/2012  . Generalized anxiety disorder 04/17/2012  . Tobacco user 04/17/2012    Past Surgical History:  Procedure Laterality Date  . Cold knife conization  Early 2000s   Pre-cancerous lesions  . DILATION AND CURETTAGE OF UTERUS    . LUNG BIOPSY Left 01/21/2017   Procedure: LUNG BIOPSY LEFT UPPER LOBE;  Surgeon: Grace Isaac, MD;  Location: [facility];  Service: Thoracic;  Laterality: Left;  Marland Kitchen VIDEO BRONCHOSCOPY N/A 01/21/2017   Procedure: VIDEO BRONCHOSCOPY;  Surgeon: Grace Isaac, MD;  Location: [facility] OR;  Service: Thoracic;  Laterality: N/A;    OB History    Gravida Para Term Preterm AB Living   1       1 2    SAB TAB Ectopic Multiple Live Births                   Home Medications    Prior to Admission medications   Medication Sig Start Date End Date Taking? Authorizing Provider  albuterol (PROAIR HFA) 108 (90 Base) MCG/ACT inhaler INHALE 2 PUFFS INTO LUNGS EVERY 4 HOURS AS NEEDED FOR COUGH, SHORTNESS OF  BREATH, OR WHEEZING 12/21/16  Yes Wardell Honour, MD  ALPRAZolam Duanne Moron) 0.5 MG tablet Take 1 tablet (0.5 mg total) by mouth at bedtime as needed for anxiety. Patient taking differently: Take 0.5 mg by mouth daily as needed for anxiety.  04/02/17  Yes Wardell Honour, MD  dexamethasone (DECADRON) 4 MG tablet Take 4 mg by mouth 2 (two) times daily with a meal.   Yes [provider]  fludrocortisone (FLORINEF) 0.1 MG tablet Take 3 tablets (0.3 mg total) by mouth daily. Patient taking differently: Take 0.1 mg by mouth 3 (three) times daily.  01/31/17  Yes Tat, Shanon Brow, MD  ipratropium-albuterol  (DUONEB) 0.5-2.5 (3) MG/3ML SOLN Take 3 mLs by nebulization every 4 (four) hours as needed (SOB and Dyspnea).   Yes [provider]  levETIRAcetam (KEPPRA) 500 MG tablet Take 1 tablet (500 mg total) by mouth 2 (two) times daily. 01/31/17  Yes Tat, Shanon Brow, MD  midodrine (PROAMATINE) 10 MG tablet Take 1 tablet (10 mg total) by mouth 3 (three) times daily with meals. 01/31/17  Yes Tat, Shanon Brow, MD  oxyCODONE (OXY IR/ROXICODONE) 5 MG immediate release tablet Take 1 tablet (5 mg total) by mouth every 4 (four) hours as needed for moderate pain. 04/24/17  Yes Truitt Merle, MD  potassium chloride SA (K-DUR,KLOR-CON) 20 MEQ tablet Take 1 tab three times daily x 3 days then daily x 3 weeks. 04/24/17  Yes Truitt Merle, MD  venlafaxine XR (EFFEXOR-XR) 150 MG 24 hr capsule TAKE 1 CAPSULE BY MOUTH ONCE DAILY ALONG WITH 75MG  CAPSULE 09/11/16  Yes Jeffery, Chelle, PA-C  venlafaxine XR (EFFEXOR-XR) 75 MG 24 hr capsule TAKE 1 CAPSULE BY MOUTH ONCE DAILY ALONG WITH 150MG  CAPSULE 09/11/16  Yes Jeffery, Chelle, PA-C  zolpidem (AMBIEN CR) 12.5 MG CR tablet Take 1 tablet (12.5 mg total) by mouth at bedtime as needed for sleep. 04/24/17  Yes Truitt Merle, MD  acetaminophen (TYLENOL) 325 MG tablet Take 2 tablets (650 mg total) by mouth every 6 (six) hours as needed for mild pain (or Fever >/= 101). Patient not taking: Reported on 03/21/2017 01/31/17   [name], MD  levofloxacin (LEVAQUIN) 750 MG tablet Take 1 tablet (750 mg total) by mouth daily. Patient not taking: Reported on 04/25/2017 03/28/17   Nita Sells, MD    Family History Family History  Problem Relation Age of Onset  . COPD Mother   . COPD Sister   . Cancer Sister 30       ovarian  . Diabetes Sister   . Mental illness Sister     Social History Social History  Substance Use Topics  . Smoking status: Current Every Day Smoker    Packs/day: 1.00    Years: 25.00  . Smokeless tobacco: Never Used  . Alcohol use Yes     Comment: wine - few glasses/day      Allergies   Patient has no known allergies.   Review of Systems Review of Systems  Unable to perform ROS: Mental status change     Physical Exam Updated Vital Signs BP (!) 146/72 (BP Location: Left Arm)   Pulse 91   Temp 98.9 F (37.2 C) (Rectal)   Resp 20   LMP 05/01/2012   SpO2 96%   Physical Exam  Constitutional: She appears well-developed and well-nourished.  HENT:  Head: Atraumatic.  Mouth/Throat: Oropharynx is clear and moist.  Eyes: Conjunctivae and EOM are normal.  Neck: Normal range of motion. No JVD present.  Cardiovascular: Normal rate,  regular rhythm, normal heart sounds and intact distal pulses.   Pulmonary/Chest: Effort normal and breath sounds normal. No respiratory distress.  Abdominal: She exhibits no distension. There is no tenderness. There is no guarding.  Musculoskeletal: Normal range of motion. She exhibits no edema.  No unilateral leg swelling  Neurological: She is alert.  Alert but not able to answer any questions. Patient does not follow commands. Moving all extremities.  Skin: Skin is warm. Capillary refill takes less than 2 seconds. No pallor.     ED Treatments / Results  Labs (all labs ordered are listed, but only abnormal results are displayed) Labs Reviewed  COMPREHENSIVE METABOLIC PANEL - Abnormal; Notable for the following:       Result Value   Sodium 134 (*)    Potassium 2.6 (*)    Chloride 96 (*)    Glucose, Bld 162 (*)    BUN <5 (*)    Total Protein 6.1 (*)    Albumin 3.2 (*)    ALT 10 (*)    All other components within normal limits  CBC WITH DIFFERENTIAL/PLATELET - Abnormal; Notable for the following:    WBC 18.7 (*)    Platelets 436 (*)    Neutro Abs 17.0 (*)    All other components within normal limits  CK  ETHANOL  URINALYSIS, ROUTINE W REFLEX MICROSCOPIC  RAPID URINE DRUG SCREEN, HOSP PERFORMED  MAGNESIUM  CBC WITH DIFFERENTIAL/PLATELET  BASIC METABOLIC PANEL  I-STAT CG4 LACTIC ACID, ED    EKG  EKG  Interpretation None       Radiology Ct Head Wo Contrast  Result Date: 04/25/2017 CLINICAL DATA:  Patient found on floor, unresponsive. History of stage IV brain and lung cancer. EXAM: CT HEAD WITHOUT CONTRAST CT CERVICAL SPINE WITHOUT CONTRAST TECHNIQUE: Multidetector CT imaging of the head and cervical spine was performed following the standard protocol without intravenous contrast. Multiplanar CT image reconstructions of the cervical spine were also generated. COMPARISON:  01/18/2017 head CT and MRI FINDINGS: CT HEAD FINDINGS Brain: Known intra-axial mass lesions in the right frontal and left occipital lobes are again visualized. The largely cystic lesion in the left occipital lobe currently measures 4.7 x 2.8 x 3.7 cm versus 4.8 x 2.6 x 4.5 cm and demonstrates a mild-to-moderate amount of vasogenic edema without midline shift. No hemorrhage is noted. The mixed attenuating partially solid and partially cystic intra-axial right frontal lobe mass is estimated at 3.5 x 3 x 3.3 cm versus 4.8 x 4.2 x 4.4 cm previously, slightly smaller in appearance. It also demonstrates a moderate amount of vasogenic edema. Less right to left midline shift is demonstrated now approximately 2 mm. Slight positive mass effect on the frontal horn of the right lateral ventricle as before though less so than on prior. No extra-axial fluid collections. No large vascular territory infarcts. No effacement of the basal cisterns. Fourth ventricle is midline. Vascular: Mild calcific atherosclerosis of the cavernous and paraclinoid internal carotid arteries. No hyperdense vessels. Skull: No fracture, suspicious lytic or blastic lesion of the skull. Sinuses/Orbits: Limited by motion artifacts. No significant paranasal sinus mucosal disease. Mastoids appear clear. Other: None CT CERVICAL SPINE FINDINGS Alignment: Normal. Skull base and vertebrae: No acute fracture. No primary bone lesion or focal pathologic process. C7 was initially  incompletely included on the cervical spine study an additional views to include the thoracic inlet and C7 were acquired. Soft tissues and spinal canal: No prevertebral fluid or swelling. No visible canal hematoma. Disc levels: No  focal disc herniation, central canal stenosis or significant neural foraminal encroachment. Upper chest: Scarring at the apices with mild biapical emphysematous change. Other: None IMPRESSION: 1. Known intra-axial mass lesions in the right frontal and left occipital lobes as above described, the right frontal lobe now measuring 4.7 x 2.8 x 3.7 cm and slightly smaller in appearance. Similarly a smaller cystic mass in the left occipital lobe now estimated at 3.5 x 3 x 3.3 cm is also noted within adjacent vasogenic edema as before. Less right to left midline shift now approximately 2 mm. 2. No new nor acute appearing CT abnormality within the brain. 3. No acute cervical spine fracture, lytic or blastic disease. No malalignment of the cervical spine. Electronically Signed   By: Ashley Royalty M.D.   On: 04/25/2017 18:54   Ct Cervical Spine Wo Contrast  Result Date: 04/25/2017 CLINICAL DATA:  Patient found on floor, unresponsive. History of stage IV brain and lung cancer. EXAM: CT HEAD WITHOUT CONTRAST CT CERVICAL SPINE WITHOUT CONTRAST TECHNIQUE: Multidetector CT imaging of the head and cervical spine was performed following the standard protocol without intravenous contrast. Multiplanar CT image reconstructions of the cervical spine were also generated. COMPARISON:  01/18/2017 head CT and MRI FINDINGS: CT HEAD FINDINGS Brain: Known intra-axial mass lesions in the right frontal and left occipital lobes are again visualized. The largely cystic lesion in the left occipital lobe currently measures 4.7 x 2.8 x 3.7 cm versus 4.8 x 2.6 x 4.5 cm and demonstrates a mild-to-moderate amount of vasogenic edema without midline shift. No hemorrhage is noted. The mixed attenuating partially solid and  partially cystic intra-axial right frontal lobe mass is estimated at 3.5 x 3 x 3.3 cm versus 4.8 x 4.2 x 4.4 cm previously, slightly smaller in appearance. It also demonstrates a moderate amount of vasogenic edema. Less right to left midline shift is demonstrated now approximately 2 mm. Slight positive mass effect on the frontal horn of the right lateral ventricle as before though less so than on prior. No extra-axial fluid collections. No large vascular territory infarcts. No effacement of the basal cisterns. Fourth ventricle is midline. Vascular: Mild calcific atherosclerosis of the cavernous and paraclinoid internal carotid arteries. No hyperdense vessels. Skull: No fracture, suspicious lytic or blastic lesion of the skull. Sinuses/Orbits: Limited by motion artifacts. No significant paranasal sinus mucosal disease. Mastoids appear clear. Other: None CT CERVICAL SPINE FINDINGS Alignment: Normal. Skull base and vertebrae: No acute fracture. No primary bone lesion or focal pathologic process. C7 was initially incompletely included on the cervical spine study an additional views to include the thoracic inlet and C7 were acquired. Soft tissues and spinal canal: No prevertebral fluid or swelling. No visible canal hematoma. Disc levels: No focal disc herniation, central canal stenosis or significant neural foraminal encroachment. Upper chest: Scarring at the apices with mild biapical emphysematous change. Other: None IMPRESSION: 1. Known intra-axial mass lesions in the right frontal and left occipital lobes as above described, the right frontal lobe now measuring 4.7 x 2.8 x 3.7 cm and slightly smaller in appearance. Similarly a smaller cystic mass in the left occipital lobe now estimated at 3.5 x 3 x 3.3 cm is also noted within adjacent vasogenic edema as before. Less right to left midline shift now approximately 2 mm. 2. No new nor acute appearing CT abnormality within the brain. 3. No acute cervical spine fracture,  lytic or blastic disease. No malalignment of the cervical spine. Electronically Signed   By: Ashley Royalty  M.D.   On: 04/25/2017 18:54   Dg Chest Portable 1 View  Result Date: 04/25/2017 CLINICAL DATA:  Stage IV brain and lung cancer altered mental status possible seizure EXAM: PORTABLE CHEST 1 VIEW COMPARISON:  03/25/2017, 01/21/2017 FINDINGS: Hyperinflation. Left upper lobe lung mass measuring approximately 5 cm. Sclerotic lesion in the proximal left humerus unchanged. No pneumothorax. Apical scarring on the left. Stable cardiomediastinal silhouette. IMPRESSION: 1. Slight decrease in size of left upper lobe lung mass radiographically. 2. Hyperinflation without acute infiltrate. Electronically Signed   By: Donavan Foil M.D.   On: 04/25/2017 18:13    Procedures Procedures (including critical care time)  Medications Ordered in ED Medications  0.9 %  sodium chloride infusion (75 mL/hr Intravenous New Bag/Given 04/25/17 2225)  enoxaparin (LOVENOX) injection 40 mg (40 mg Subcutaneous Given 04/25/17 2309)  LORazepam (ATIVAN) injection 1-2 mg (not administered)  ondansetron (ZOFRAN) tablet 4 mg (not administered)    Or  ondansetron (ZOFRAN) injection 4 mg (not administered)  potassium chloride 10 mEq in 100 mL IVPB (10 mEq Intravenous New Bag/Given 04/25/17 2327)  dexamethasone (DECADRON) injection 6 mg (6 mg Intravenous Given 04/25/17 2300)  levETIRAcetam (KEPPRA) 750 mg in sodium chloride 0.9 % 100 mL IVPB (0 mg Intravenous Stopped 04/25/17 2315)  levETIRAcetam (KEPPRA) 1,000 mg in sodium chloride 0.9 % 100 mL IVPB (0 mg Intravenous Stopped 04/25/17 2016)  dexamethasone (DECADRON) injection 10 mg (10 mg Intravenous Given 04/25/17 1944)     Initial Impression / Assessment and Plan / ED Course  I have reviewed the triage vital signs and the nursing notes.  Pertinent labs & imaging results that were available during my care of the patient were reviewed by me and considered in my medical decision making (see  chart for details).     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 No signs of trauma. Urinary incontinence and vomiting prior to arrival. Afebrile, hemodynamically stable. Patient is alert but unable to follow commands. Moving all extremities.  Differential diagnosis includes seizure, electrolyte abnormality, intoxication, intracranial hemorrhage.  Patient with known case of the neck edema surrounding her intracranial mass, started on steroids yesterday. Patient is also on Keppra. CT head showed basically neck edema with improvement of her masses and mass effect. No sign of intracranial hemorrhage. Chest x-ray showed no signs of pneumonia. Potassium 2.6. No other significant electrolyte abnormalities.  Patient most likely had a seizure secondary to intracranial masses and vasogenic edema. Patient was loaded with Keppra and given IV steroids. Neurology was consulted, evaluated the patient in the emergency Department since the patient had not yet returned to baseline. Patient admitted to medicine for further management. Patient is DNR, but uncertain if the patient is DNI.  Patient stable for the floor.  Final Clinical Impressions(s) / ED Diagnoses   Final diagnoses:  Brain metastasis [facility])    New Prescriptions Current Discharge Medication List       Nathaniel Man, MD 04/26/17 0009    Margette Fast, MD 04/26/17 [phone]

## 2017-04-25 NOTE — ED Notes (Signed)
Admitting MD at bedside.

## 2017-04-25 NOTE — H&P (Addendum)
History and Physical    Erika Hunter OAC:[id number] DOB: 28-Sep-1971 DOA: 04/25/2017  Referring MD/NP/PA: Nathaniel Man, PA-C PCP: Wardell Honour, MD  Patient coming from: Home via EMS  Chief Complaint: found down  HPI: Erika Hunter is a 46 y.o. 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 History is obtained from the patient's friend who helps look after her as the patient is currently unable to provide her own. Reportedly sometime around 2 PM the patient was found at home unconscious with her bed/mattress on top of her. On the scene patient was noted to have vomitus with the urinary incontinence. Since being found the patient has not returned to her normal self. Patient for the will open eyes, but not following commands. Normally patient had been able to complete most of her ADLs without assistance. However, over this last week or so the patient had been complaining of severe headaches and hearing a swishing heartbeat sound in her ears.  She is followed by Dr. Burr Medico of oncology.The friend reports that the patient had been Prior and had been tapering down on her steroid dose after metastases to brain been found. She was on hospice, but had just recently stopped this service in hopes of obtaining a repeat MRI of her brain.  ED Course: Upon admission into the emergency department patient was seen to be afebrile, heart rate 91-111, respirations 17-24, and other vitals stable. Neurology was consulted as patient was suspected to possibly have a seizure given history of metastatic brain lesions and recommended giving the patient 10 mg of  dexamethasone and  loading the patient with thousand milligrams of keppra IV. Labs revealed WBC 18.7, platelets 436. Sodium 134, potassium 2.6, chloride 96, glucose 162, and alcohol level undetectable. CT imaging showed similar  metastatic lesions of the brain with vasogenic edema with possibly 2 mm of right-to-left midline shift and appears less than previous studies. X-ray imaging of the chest showed lung mass, but no acute signs of an infiltrate.  Review of Systems:  Unable to obtain secondary to patient condition.  Past Medical History:  Diagnosis Date  . Alcohol dependence ([place]) 01/24/2017  . Anxiety    panic disorder  . Asthma   . Brain cancer ([place])   . Cervical dysplasia 11/19/1988   S/P conization  . COPD (chronic obstructive pulmonary disease) ([place]) 01/24/2017  . Depression   . Hyperlipidemia   . Lung cancer ([place])   . Steatohepatitis    h/o heavy alcohol use, hepatitis profile negative for A, B    Past Surgical History:  Procedure Laterality Date  . Cold knife conization  Early 2000s   Pre-cancerous lesions  . DILATION AND CURETTAGE OF UTERUS    . LUNG BIOPSY Left 01/21/2017   Procedure: LUNG BIOPSY LEFT UPPER LOBE;  Surgeon: Grace Isaac, MD;  Location: [facility];  Service: Thoracic;  Laterality: Left;  Marland Kitchen VIDEO BRONCHOSCOPY N/A 01/21/2017   Procedure: VIDEO BRONCHOSCOPY;  Surgeon: Grace Isaac, MD;  Location: [facility];  Service: Thoracic;  Laterality: N/A;     reports that she has been smoking.  She has a 25.00 pack-year smoking history. She has never used smokeless tobacco. She reports that she drinks alcohol. She reports that she does not use drugs.  No Known Allergies  Family History  Problem Relation Age of Onset  . COPD Mother   .  COPD Sister   . Cancer Sister 30       ovarian  . Diabetes Sister   . Mental illness Sister     Prior to Admission medications   Medication Sig Start Date End Date Taking? Authorizing Provider  albuterol (PROAIR HFA) 108 (90 Base) MCG/ACT inhaler INHALE 2 PUFFS INTO LUNGS EVERY 4 HOURS AS NEEDED FOR COUGH, SHORTNESS OF BREATH, OR WHEEZING 12/21/16  Yes Wardell Honour, MD  ALPRAZolam Duanne Moron) 0.5 MG tablet Take 1 tablet (0.5 mg total) by mouth at bedtime as  needed for anxiety. Patient taking differently: Take 0.5 mg by mouth daily as needed for anxiety.  04/02/17  Yes Wardell Honour, MD  dexamethasone (DECADRON) 4 MG tablet Take 4 mg by mouth 2 (two) times daily with a meal.   Yes [provider]  fludrocortisone (FLORINEF) 0.1 MG tablet Take 3 tablets (0.3 mg total) by mouth daily. Patient taking differently: Take 0.1 mg by mouth 3 (three) times daily.  01/31/17  Yes Tat, Shanon Brow, MD  ipratropium-albuterol (DUONEB) 0.5-2.5 (3) MG/3ML SOLN Take 3 mLs by nebulization every 4 (four) hours as needed (SOB and Dyspnea).   Yes [provider]  levETIRAcetam (KEPPRA) 500 MG tablet Take 1 tablet (500 mg total) by mouth 2 (two) times daily. 01/31/17  Yes Tat, Shanon Brow, MD  midodrine (PROAMATINE) 10 MG tablet Take 1 tablet (10 mg total) by mouth 3 (three) times daily with meals. 01/31/17  Yes Tat, Shanon Brow, MD  oxyCODONE (OXY IR/ROXICODONE) 5 MG immediate release tablet Take 1 tablet (5 mg total) by mouth every 4 (four) hours as needed for moderate pain. 04/24/17  Yes Truitt Merle, MD  potassium chloride SA (K-DUR,KLOR-CON) 20 MEQ tablet Take 1 tab three times daily x 3 days then daily x 3 weeks. 04/24/17  Yes Truitt Merle, MD  venlafaxine XR (EFFEXOR-XR) 150 MG 24 hr capsule TAKE 1 CAPSULE BY MOUTH ONCE DAILY ALONG WITH 75MG  CAPSULE 09/11/16  Yes Jeffery, Chelle, PA-C  venlafaxine XR (EFFEXOR-XR) 75 MG 24 hr capsule TAKE 1 CAPSULE BY MOUTH ONCE DAILY ALONG WITH 150MG  CAPSULE 09/11/16  Yes Jeffery, Chelle, PA-C  zolpidem (AMBIEN CR) 12.5 MG CR tablet Take 1 tablet (12.5 mg total) by mouth at bedtime as needed for sleep. 04/24/17  Yes Truitt Merle, MD  acetaminophen (TYLENOL) 325 MG tablet Take 2 tablets (650 mg total) by mouth every 6 (six) hours as needed for mild pain (or Fever >/= 101). Patient not taking: Reported on 03/21/2017 01/31/17   [name], MD  levofloxacin (LEVAQUIN) 750 MG tablet Take 1 tablet (750 mg total) by mouth daily. Patient not taking: Reported on  04/25/2017 03/28/17   Nita Sells, MD    Physical Exam:    Constitutional: Patient is alert and appears in NAD, but not actively following commands at this time Vitals:   04/25/17 1845 04/25/17 1915 04/25/17 1930 04/25/17 1945  BP:      Pulse: 98 (!) 101 93 98  Resp: 18 20 18  (!) 24  Temp:      TempSrc:      SpO2: 97% 98% 98% 98%   Eyes: PERRL, lids and conjunctivae normal ENMT: Mucous membranes are dry. dried vomitus/blood present on patient's lips. Cannot get patient to open mouth at this time to assess tongue. Neck: normal, supple, no masses, no thyromegaly Respiratory: clear to auscultation bilaterally, no wheezing, no crackles. Normal respiratory effort. No accessory muscle use.  Cardiovascular: Regular rate and rhythm, no murmurs / rubs / gallops.  No extremity edema. 2+ pedal pulses. No carotid bruits.  Abdomen: no tenderness, no masses palpated. No hepatosplenomegaly. Bowel sounds positive.  Musculoskeletal: no clubbing / cyanosis. No joint deformity upper and lower extremities. Good ROM, no contractures. Normal muscle tone.  Skin: no rashes, lesions, ulcers. No induration Neurologic: CN 2-12 grossly intact. Sensation intact, DTR normal. Strength 5/5 in all 4.  Psychiatric: Alert. Normal mood.     Labs on Admission: I have personally reviewed following labs and imaging studies  CBC:  Recent Labs Lab 04/24/17 1328 04/25/17 1746  WBC 6.8 18.7*  NEUTROABS 5.3 17.0*  HGB 12.9 12.8  HCT 38.8 39.4  MCV 92.6 92.7  PLT 376 585*   Basic Metabolic Panel:  Recent Labs Lab 04/24/17 1328 04/25/17 1746  NA 138 134*  K 2.2* 2.6*  CL  --  96*  CO2 34* 26  GLUCOSE 78 162*  BUN 5.2* <5*  CREATININE 0.6 0.66  CALCIUM 9.7 9.1   GFR: Estimated Creatinine Clearance: 73.8 mL/min (by C-G formula based on SCr of 0.66 mg/dL). Liver Function Tests:  Recent Labs Lab 04/24/17 1328 04/25/17 1746  AST 11 20  ALT 7 10*  ALKPHOS 97 86  BILITOT 0.46 0.7  PROT 6.6  6.1*  ALBUMIN 3.2* 3.2*   No results for input(s): LIPASE, AMYLASE in the last 168 hours. No results for input(s): AMMONIA in the last 168 hours. Coagulation Profile: No results for input(s): INR, PROTIME in the last 168 hours. Cardiac Enzymes:  Recent Labs Lab 04/25/17 1746  CKTOTAL 48   BNP (last 3 results) No results for input(s): PROBNP in the last 8760 hours. HbA1C: No results for input(s): HGBA1C in the last 72 hours. CBG: No results for input(s): GLUCAP in the last 168 hours. Lipid Profile: No results for input(s): CHOL, HDL, LDLCALC, TRIG, CHOLHDL, LDLDIRECT in the last 72 hours. Thyroid Function Tests: No results for input(s): TSH, T4TOTAL, FREET4, T3FREE, THYROIDAB in the last 72 hours. Anemia Panel: No results for input(s): VITAMINB12, FOLATE, FERRITIN, TIBC, IRON, RETICCTPCT in the last 72 hours. Urine analysis:    Component Value Date/Time   COLORURINE YELLOW 03/25/2017 1940   APPEARANCEUR CLEAR 03/25/2017 1940   LABSPEC >1.046 (H) 03/25/2017 1940   PHURINE 6.0 03/25/2017 1940   GLUCOSEU NEGATIVE 03/25/2017 1940   HGBUR MODERATE (A) 03/25/2017 1940   BILIRUBINUR NEGATIVE 03/25/2017 1940   BILIRUBINUR neg 08/24/2014 1703   KETONESUR NEGATIVE 03/25/2017 1940   PROTEINUR 100 (A) 03/25/2017 1940   UROBILINOGEN 0.2 08/24/2014 1703   NITRITE NEGATIVE 03/25/2017 1940   LEUKOCYTESUR NEGATIVE 03/25/2017 1940   Sepsis Labs: No results found for this or any previous visit (from the past 240 hour(s)).   Radiological Exams on Admission: Ct Head Wo Contrast  Result Date: 04/25/2017 CLINICAL DATA:  Patient found on floor, unresponsive. History of stage IV brain and lung cancer. EXAM: CT HEAD WITHOUT CONTRAST CT CERVICAL SPINE WITHOUT CONTRAST TECHNIQUE: Multidetector CT imaging of the head and cervical spine was performed following the standard protocol without intravenous contrast. Multiplanar CT image reconstructions of the cervical spine were also generated.  COMPARISON:  01/18/2017 head CT and MRI FINDINGS: CT HEAD FINDINGS Brain: Known intra-axial mass lesions in the right frontal and left occipital lobes are again visualized. The largely cystic lesion in the left occipital lobe currently measures 4.7 x 2.8 x 3.7 cm versus 4.8 x 2.6 x 4.5 cm and demonstrates a mild-to-moderate amount of vasogenic edema without midline shift. No hemorrhage is noted. The mixed  attenuating partially solid and partially cystic intra-axial right frontal lobe mass is estimated at 3.5 x 3 x 3.3 cm versus 4.8 x 4.2 x 4.4 cm previously, slightly smaller in appearance. It also demonstrates a moderate amount of vasogenic edema. Less right to left midline shift is demonstrated now approximately 2 mm. Slight positive mass effect on the frontal horn of the right lateral ventricle as before though less so than on prior. No extra-axial fluid collections. No large vascular territory infarcts. No effacement of the basal cisterns. Fourth ventricle is midline. Vascular: Mild calcific atherosclerosis of the cavernous and paraclinoid internal carotid arteries. No hyperdense vessels. Skull: No fracture, suspicious lytic or blastic lesion of the skull. Sinuses/Orbits: Limited by motion artifacts. No significant paranasal sinus mucosal disease. Mastoids appear clear. Other: None CT CERVICAL SPINE FINDINGS Alignment: Normal. Skull base and vertebrae: No acute fracture. No primary bone lesion or focal pathologic process. C7 was initially incompletely included on the cervical spine study an additional views to include the thoracic inlet and C7 were acquired. Soft tissues and spinal canal: No prevertebral fluid or swelling. No visible canal hematoma. Disc levels: No focal disc herniation, central canal stenosis or significant neural foraminal encroachment. Upper chest: Scarring at the apices with mild biapical emphysematous change. Other: None IMPRESSION: 1. Known intra-axial mass lesions in the right frontal and  left occipital lobes as above described, the right frontal lobe now measuring 4.7 x 2.8 x 3.7 cm and slightly smaller in appearance. Similarly a smaller cystic mass in the left occipital lobe now estimated at 3.5 x 3 x 3.3 cm is also noted within adjacent vasogenic edema as before. Less right to left midline shift now approximately 2 mm. 2. No new nor acute appearing CT abnormality within the brain. 3. No acute cervical spine fracture, lytic or blastic disease. No malalignment of the cervical spine. Electronically Signed   By: Ashley Royalty M.D.   On: 04/25/2017 18:54   Ct Cervical Spine Wo Contrast  Result Date: 04/25/2017 CLINICAL DATA:  Patient found on floor, unresponsive. History of stage IV brain and lung cancer. EXAM: CT HEAD WITHOUT CONTRAST CT CERVICAL SPINE WITHOUT CONTRAST TECHNIQUE: Multidetector CT imaging of the head and cervical spine was performed following the standard protocol without intravenous contrast. Multiplanar CT image reconstructions of the cervical spine were also generated. COMPARISON:  01/18/2017 head CT and MRI FINDINGS: CT HEAD FINDINGS Brain: Known intra-axial mass lesions in the right frontal and left occipital lobes are again visualized. The largely cystic lesion in the left occipital lobe currently measures 4.7 x 2.8 x 3.7 cm versus 4.8 x 2.6 x 4.5 cm and demonstrates a mild-to-moderate amount of vasogenic edema without midline shift. No hemorrhage is noted. The mixed attenuating partially solid and partially cystic intra-axial right frontal lobe mass is estimated at 3.5 x 3 x 3.3 cm versus 4.8 x 4.2 x 4.4 cm previously, slightly smaller in appearance. It also demonstrates a moderate amount of vasogenic edema. Less right to left midline shift is demonstrated now approximately 2 mm. Slight positive mass effect on the frontal horn of the right lateral ventricle as before though less so than on prior. No extra-axial fluid collections. No large vascular territory infarcts. No  effacement of the basal cisterns. Fourth ventricle is midline. Vascular: Mild calcific atherosclerosis of the cavernous and paraclinoid internal carotid arteries. No hyperdense vessels. Skull: No fracture, suspicious lytic or blastic lesion of the skull. Sinuses/Orbits: Limited by motion artifacts. No significant paranasal sinus mucosal disease. Mastoids  appear clear. Other: None CT CERVICAL SPINE FINDINGS Alignment: Normal. Skull base and vertebrae: No acute fracture. No primary bone lesion or focal pathologic process. C7 was initially incompletely included on the cervical spine study an additional views to include the thoracic inlet and C7 were acquired. Soft tissues and spinal canal: No prevertebral fluid or swelling. No visible canal hematoma. Disc levels: No focal disc herniation, central canal stenosis or significant neural foraminal encroachment. Upper chest: Scarring at the apices with mild biapical emphysematous change. Other: None IMPRESSION: 1. Known intra-axial mass lesions in the right frontal and left occipital lobes as above described, the right frontal lobe now measuring 4.7 x 2.8 x 3.7 cm and slightly smaller in appearance. Similarly a smaller cystic mass in the left occipital lobe now estimated at 3.5 x 3 x 3.3 cm is also noted within adjacent vasogenic edema as before. Less right to left midline shift now approximately 2 mm. 2. No new nor acute appearing CT abnormality within the brain. 3. No acute cervical spine fracture, lytic or blastic disease. No malalignment of the cervical spine. Electronically Signed   By: Ashley Royalty M.D.   On: 04/25/2017 18:54   Dg Chest Portable 1 View  Result Date: 04/25/2017 CLINICAL DATA:  Stage IV brain and lung cancer altered mental status possible seizure EXAM: PORTABLE CHEST 1 VIEW COMPARISON:  03/25/2017, 01/21/2017 FINDINGS: Hyperinflation. Left upper lobe lung mass measuring approximately 5 cm. Sclerotic lesion in the proximal left humerus unchanged. No  pneumothorax. Apical scarring on the left. Stable cardiomediastinal silhouette. IMPRESSION: 1. Slight decrease in size of left upper lobe lung mass radiographically. 2. Hyperinflation without acute infiltrate. Electronically Signed   By: Donavan Foil M.D.   On: 04/25/2017 18:13    EKG: Independently reviewed. Sinus tachycardia at 107 bpm  Assessment/Plan Suspected seizure: Acute. Patient found down with vomitus and urinary incontinence. CT scan showing vasogenic edema with less right to left midline shift than previous scans. Neurology consult and recommended and given the patient 10 mg of Decadron along with 100 mg of Keppra IV. Patient previously on 500 mg twice a day. On physical exam patient stage appears to possibly be post ictal. - Admit to telemetry bed - Seizure admission order set initiated  - Ativan IV prn seizure activity - IVF Ns at 75 ml/hr - appreciate neurology consultative services, for follow-up for further recommendations  Acute encephalopathy/suspected postictal: Patient not back to baseline at this time. - NPO - neuro checks  Leukocytosis: Previously elevated at 18.7. Suspect this could be reactive due to suspected seizure and patient being on chronic steroids. Chest x-ray shows no acute infiltrate. - Check UA - Recheck CBC in a.m.  Hypokalemia: Acute. Potassium initially 2.6 on admission. - Potassium chloride IV 60 mEq x 1 dose. - Follow-up magnesium and will replace as needed  Stage IV SCLC metastatic cancer, Goals of care: Patient recently was taken off of hospice to obtain MRI. We'll need to discuss goals of care. - Palliative care consult  Alcohol and tobacco abuse history - CWIA DVT prophylaxis:SCD   Code Status: DNR  Family Communication: Discuss care with family present at bedside Disposition Plan: Likely discharge back home with hospice in 1-2 days Consults called: neurology Admission status: Observation  Norval Morton MD [facility] Pager  [phone]  If 7PM-7AM, please contact night-coverage [email or web address] Password [email or web address]  04/25/2017, 8:03 PM

## 2017-04-26 ENCOUNTER — Observation Stay (HOSPITAL_COMMUNITY): Payer: Medicaid Other

## 2017-04-26 ENCOUNTER — Telehealth: Payer: Self-pay | Admitting: Hematology

## 2017-04-26 ENCOUNTER — Encounter (HOSPITAL_COMMUNITY): Payer: Self-pay | Admitting: *Deleted

## 2017-04-26 DIAGNOSIS — R32 Unspecified urinary incontinence: Secondary | ICD-10-CM | POA: Diagnosis present

## 2017-04-26 DIAGNOSIS — C3492 Malignant neoplasm of unspecified part of left bronchus or lung: Secondary | ICD-10-CM | POA: Diagnosis not present

## 2017-04-26 DIAGNOSIS — Z825 Family history of asthma and other chronic lower respiratory diseases: Secondary | ICD-10-CM | POA: Diagnosis not present

## 2017-04-26 DIAGNOSIS — Z7189 Other specified counseling: Secondary | ICD-10-CM | POA: Diagnosis not present

## 2017-04-26 DIAGNOSIS — D72829 Elevated white blood cell count, unspecified: Secondary | ICD-10-CM | POA: Diagnosis present

## 2017-04-26 DIAGNOSIS — G9340 Encephalopathy, unspecified: Secondary | ICD-10-CM | POA: Diagnosis present

## 2017-04-26 DIAGNOSIS — F17210 Nicotine dependence, cigarettes, uncomplicated: Secondary | ICD-10-CM | POA: Diagnosis present

## 2017-04-26 DIAGNOSIS — E876 Hypokalemia: Secondary | ICD-10-CM | POA: Diagnosis present

## 2017-04-26 DIAGNOSIS — C3412 Malignant neoplasm of upper lobe, left bronchus or lung: Secondary | ICD-10-CM | POA: Diagnosis not present

## 2017-04-26 DIAGNOSIS — Z515 Encounter for palliative care: Secondary | ICD-10-CM | POA: Diagnosis present

## 2017-04-26 DIAGNOSIS — C3490 Malignant neoplasm of unspecified part of unspecified bronchus or lung: Secondary | ICD-10-CM | POA: Diagnosis present

## 2017-04-26 DIAGNOSIS — Z923 Personal history of irradiation: Secondary | ICD-10-CM | POA: Diagnosis not present

## 2017-04-26 DIAGNOSIS — Z66 Do not resuscitate: Secondary | ICD-10-CM | POA: Diagnosis present

## 2017-04-26 DIAGNOSIS — Z79899 Other long term (current) drug therapy: Secondary | ICD-10-CM | POA: Diagnosis not present

## 2017-04-26 DIAGNOSIS — G936 Cerebral edema: Secondary | ICD-10-CM | POA: Diagnosis present

## 2017-04-26 DIAGNOSIS — F1020 Alcohol dependence, uncomplicated: Secondary | ICD-10-CM | POA: Diagnosis present

## 2017-04-26 DIAGNOSIS — F05 Delirium due to known physiological condition: Secondary | ICD-10-CM | POA: Diagnosis present

## 2017-04-26 DIAGNOSIS — D630 Anemia in neoplastic disease: Secondary | ICD-10-CM | POA: Diagnosis present

## 2017-04-26 DIAGNOSIS — R569 Unspecified convulsions: Secondary | ICD-10-CM | POA: Diagnosis present

## 2017-04-26 DIAGNOSIS — D649 Anemia, unspecified: Secondary | ICD-10-CM | POA: Diagnosis not present

## 2017-04-26 DIAGNOSIS — C7931 Secondary malignant neoplasm of brain: Secondary | ICD-10-CM | POA: Diagnosis present

## 2017-04-26 DIAGNOSIS — J449 Chronic obstructive pulmonary disease, unspecified: Secondary | ICD-10-CM | POA: Diagnosis present

## 2017-04-26 DIAGNOSIS — F419 Anxiety disorder, unspecified: Secondary | ICD-10-CM | POA: Diagnosis present

## 2017-04-26 LAB — CBC WITH DIFFERENTIAL/PLATELET
BASOS PCT: 0 %
Basophils Absolute: 0 10*3/uL (ref 0.0–0.1)
Eosinophils Absolute: 0 10*3/uL (ref 0.0–0.7)
Eosinophils Relative: 0 %
HEMATOCRIT: 36.3 % (ref 36.0–46.0)
HEMOGLOBIN: 11.9 g/dL — AB (ref 12.0–15.0)
LYMPHS PCT: 5 %
Lymphs Abs: 0.5 10*3/uL — ABNORMAL LOW (ref 0.7–4.0)
MCH: 29.8 pg (ref 26.0–34.0)
MCHC: 32.8 g/dL (ref 30.0–36.0)
MCV: 91 fL (ref 78.0–100.0)
MONO ABS: 0.2 10*3/uL (ref 0.1–1.0)
MONOS PCT: 2 %
NEUTROS ABS: 9.5 10*3/uL — AB (ref 1.7–7.7)
NEUTROS PCT: 93 %
Platelets: 359 10*3/uL (ref 150–400)
RBC: 3.99 MIL/uL (ref 3.87–5.11)
RDW: 14.7 % (ref 11.5–15.5)
WBC: 10.2 10*3/uL (ref 4.0–10.5)

## 2017-04-26 LAB — BASIC METABOLIC PANEL
Anion gap: 13 (ref 5–15)
BUN: <5 mg/dL — ABNORMAL LOW (ref 6–20)
CO2: 25 mmol/L (ref 22–32)
CREATININE: 0.5 mg/dL (ref 0.44–1.00)
Calcium: 8.8 mg/dL — ABNORMAL LOW (ref 8.9–10.3)
Chloride: 99 mmol/L — ABNORMAL LOW (ref 101–111)
GFR calc non Af Amer: >60 mL/min (ref >60)
Glucose, Bld: 154 mg/dL — ABNORMAL HIGH (ref 65–99)
Potassium: 3.7 mmol/L (ref 3.5–5.1)
Sodium: 137 mmol/L (ref 135–145)

## 2017-04-26 LAB — MAGNESIUM: Magnesium: 1.8 mg/dL (ref 1.7–2.4)

## 2017-04-26 MED ORDER — VITAMIN B-1 100 MG PO TABS
100.0000 mg | ORAL_TABLET | Freq: Every day | ORAL | Status: DC
  Administered 2017-04-27: 100 mg via ORAL
  Filled 2017-04-26: qty 1

## 2017-04-26 MED ORDER — LORAZEPAM 1 MG PO TABS: 1.0000 mg | ORAL_TABLET | Freq: Four times a day (QID) | ORAL | Status: DC | PRN

## 2017-04-26 MED ORDER — FOLIC ACID 1 MG PO TABS
1.0000 mg | ORAL_TABLET | Freq: Every day | ORAL | Status: DC
  Administered 2017-04-27: 1 mg via ORAL
  Filled 2017-04-26: qty 1

## 2017-04-26 MED ORDER — GADOBENATE DIMEGLUMINE 529 MG/ML IV SOLN
10.0000 mL | Freq: Once | INTRAVENOUS | Status: AC | PRN
  Administered 2017-04-26: 10 mL via INTRAVENOUS

## 2017-04-26 MED ORDER — LORAZEPAM 2 MG/ML IJ SOLN: 1.0000 mg | Freq: Four times a day (QID) | INTRAMUSCULAR | Status: DC | PRN

## 2017-04-26 MED ORDER — ADULT MULTIVITAMIN W/MINERALS CH
1.0000 | ORAL_TABLET | Freq: Every day | ORAL | Status: DC
  Administered 2017-04-27: 1 via ORAL
  Filled 2017-04-26: qty 1

## 2017-04-26 MED ORDER — SODIUM CHLORIDE 0.9% FLUSH: 3.0000 mL | INTRAVENOUS | Status: DC | PRN

## 2017-04-26 MED ORDER — THIAMINE HCL 100 MG/ML IJ SOLN: 100.0000 mg | Freq: Every day | INTRAMUSCULAR | Status: DC

## 2017-04-26 MED ORDER — SODIUM CHLORIDE 0.9% FLUSH
3.0000 mL | Freq: Two times a day (BID) | INTRAVENOUS | Status: DC
  Administered 2017-04-26 – 2017-04-27 (×3): 3 mL via INTRAVENOUS

## 2017-04-26 MED ORDER — SODIUM CHLORIDE 0.9 % IV SOLN: 250.0000 mL | INTRAVENOUS | Status: DC | PRN

## 2017-04-26 NOTE — Progress Notes (Signed)
Erika Hunter   DOB:[birth year]   LK#:[id number]   DGU#:[id number]  ONCOLOGY FOLLOW UP NOTE   Subjective: Pt is well-known to me, was previously under hospice care for her metastatic small cell lung cancer. I saw her in my office 2 days ago for her worsening headaches, and I ordered brain MRI to ruled out brain metastasis progression. She was admitted yesterday for altered mental status and a possible seizure. She was very drowsy when I saw her in the hospital, arousable, but does not answer questions.   Objective:  Vitals:   04/26/17 1435 04/26/17 1829  BP: 132/60 130/61  Pulse: 84 66  Resp: 19 19  Temp: 99.5 F (37.5 C) 99.2 F (37.3 C)    Body mass index is 19.52 kg/m.  Intake/Output Summary (Last 24 hours) at 04/26/17 2040 Last data filed at 04/26/17 1500  Gross per 24 hour  Intake          2058.75 ml  Output                0 ml  Net          2058.75 ml     Sclerae unicteric  Oropharynx clear  No peripheral adenopathy  Lungs clear -- no rales or rhonchi  Heart regular rate and rhythm  Abdomen benign     CBG (last 3)  No results for input(s): GLUCAP in the last 72 hours.   Labs:  Lab Results  Component Value Date   WBC 10.2 04/26/2017   HGB 11.9 (L) 04/26/2017   HCT 36.3 04/26/2017   MCV 91.0 04/26/2017   PLT 359 04/26/2017   NEUTROABS 9.5 (H) 04/26/2017   CMP Latest Ref Rng & Units 04/26/2017 04/25/2017 04/24/2017  Glucose 65 - 99 mg/dL 154(H) 162(H) 78  BUN 6 - 20 mg/dL <5(L) <5(L) 5.2(L)  Creatinine 0.44 - 1.00 mg/dL 0.50 0.66 0.6  Sodium 135 - 145 mmol/L 137 134(L) 138  Potassium 3.5 - 5.1 mmol/L 3.7 2.6(LL) 2.2(LL)  Chloride 101 - 111 mmol/L 99(L) 96(L) -  CO2 22 - 32 mmol/L 25 26 34(H)  Calcium 8.9 - 10.3 mg/dL 8.8(L) 9.1 9.7  Total Protein 6.5 - 8.1 g/dL - 6.1(L) 6.6  Total Bilirubin 0.3 - 1.2 mg/dL - 0.7 0.46  Alkaline Phos 38 - 126 U/L - 86 97  AST 15 - 41 U/L - 20 11  ALT 14 - 54 U/L - 10(L) 7     Urine Studies No results for input(s): UHGB, CRYS  in the last 72 hours.  Invalid input(s): UACOL, UAPR, USPG, UPH, UTP, UGL, UKET, UBIL, UNIT, UROB, ULEU, UEPI, UWBC, URBC, UBAC, CAST, [place], [place]  Basic Metabolic Panel:  Recent Labs Lab 04/24/17 1328 04/25/17 1746 04/26/17 0516  NA 138 134* 137  K 2.2* 2.6* 3.7  CL  --  96* 99*  CO2 34* 26 25  GLUCOSE 78 162* 154*  BUN 5.2* <5* <5*  CREATININE 0.6 0.66 0.50  CALCIUM 9.7 9.1 8.8*  MG  --   --  1.8   GFR Estimated Creatinine Clearance: 73 mL/min (by C-G formula based on SCr of 0.5 mg/dL). Liver Function Tests:  Recent Labs Lab 04/24/17 1328 04/25/17 1746  AST 11 20  ALT 7 10*  ALKPHOS 97 86  BILITOT 0.46 0.7  PROT 6.6 6.1*  ALBUMIN 3.2* 3.2*   No results for input(s): LIPASE, AMYLASE in the last 168 hours. No results for input(s): AMMONIA in the last 168 hours. Coagulation profile No  results for input(s): INR, PROTIME in the last 168 hours.  CBC:  Recent Labs Lab 04/24/17 1328 04/25/17 1746 04/26/17 0516  WBC 6.8 18.7* 10.2  NEUTROABS 5.3 17.0* 9.5*  HGB 12.9 12.8 11.9*  HCT 38.8 39.4 36.3  MCV 92.6 92.7 91.0  PLT 376 436* 359   Cardiac Enzymes:  Recent Labs Lab 04/25/17 1746  CKTOTAL 48   BNP: Invalid input(s): POCBNP CBG: No results for input(s): GLUCAP in the last 168 hours. D-Dimer No results for input(s): DDIMER in the last 72 hours. Hgb A1c No results for input(s): HGBA1C in the last 72 hours. Lipid Profile No results for input(s): CHOL, HDL, LDLCALC, TRIG, CHOLHDL, LDLDIRECT in the last 72 hours. Thyroid function studies No results for input(s): TSH, T4TOTAL, T3FREE, THYROIDAB in the last 72 hours.  Invalid input(s): FREET3 Anemia work up No results for input(s): VITAMINB12, FOLATE, FERRITIN, TIBC, IRON, RETICCTPCT in the last 72 hours. Microbiology No results found for this or any previous visit (from the past 240 hour(s)).    Studies:  Ct Head Wo Contrast  Result Date: 04/25/2017 CLINICAL DATA:  Patient found on floor,  unresponsive. History of stage IV brain and lung cancer. EXAM: CT HEAD WITHOUT CONTRAST CT CERVICAL SPINE WITHOUT CONTRAST TECHNIQUE: Multidetector CT imaging of the head and cervical spine was performed following the standard protocol without intravenous contrast. Multiplanar CT image reconstructions of the cervical spine were also generated. COMPARISON:  01/18/2017 head CT and MRI FINDINGS: CT HEAD FINDINGS Brain: Known intra-axial mass lesions in the right frontal and left occipital lobes are again visualized. The largely cystic lesion in the left occipital lobe currently measures 4.7 x 2.8 x 3.7 cm versus 4.8 x 2.6 x 4.5 cm and demonstrates a mild-to-moderate amount of vasogenic edema without midline shift. No hemorrhage is noted. The mixed attenuating partially solid and partially cystic intra-axial right frontal lobe mass is estimated at 3.5 x 3 x 3.3 cm versus 4.8 x 4.2 x 4.4 cm previously, slightly smaller in appearance. It also demonstrates a moderate amount of vasogenic edema. Less right to left midline shift is demonstrated now approximately 2 mm. Slight positive mass effect on the frontal horn of the right lateral ventricle as before though less so than on prior. No extra-axial fluid collections. No large vascular territory infarcts. No effacement of the basal cisterns. Fourth ventricle is midline. Vascular: Mild calcific atherosclerosis of the cavernous and paraclinoid internal carotid arteries. No hyperdense vessels. Skull: No fracture, suspicious lytic or blastic lesion of the skull. Sinuses/Orbits: Limited by motion artifacts. No significant paranasal sinus mucosal disease. Mastoids appear clear. Other: None CT CERVICAL SPINE FINDINGS Alignment: Normal. Skull base and vertebrae: No acute fracture. No primary bone lesion or focal pathologic process. C7 was initially incompletely included on the cervical spine study an additional views to include the thoracic inlet and C7 were acquired. Soft tissues  and spinal canal: No prevertebral fluid or swelling. No visible canal hematoma. Disc levels: No focal disc herniation, central canal stenosis or significant neural foraminal encroachment. Upper chest: Scarring at the apices with mild biapical emphysematous change. Other: None IMPRESSION: 1. Known intra-axial mass lesions in the right frontal and left occipital lobes as above described, the right frontal lobe now measuring 4.7 x 2.8 x 3.7 cm and slightly smaller in appearance. Similarly a smaller cystic mass in the left occipital lobe now estimated at 3.5 x 3 x 3.3 cm is also noted within adjacent vasogenic edema as before. Less right to left midline  shift now approximately 2 mm. 2. No new nor acute appearing CT abnormality within the brain. 3. No acute cervical spine fracture, lytic or blastic disease. No malalignment of the cervical spine. Electronically Signed   By: Ashley Royalty M.D.   On: 04/25/2017 18:54   Ct Cervical Spine Wo Contrast  Result Date: 04/25/2017 CLINICAL DATA:  Patient found on floor, unresponsive. History of stage IV brain and lung cancer. EXAM: CT HEAD WITHOUT CONTRAST CT CERVICAL SPINE WITHOUT CONTRAST TECHNIQUE: Multidetector CT imaging of the head and cervical spine was performed following the standard protocol without intravenous contrast. Multiplanar CT image reconstructions of the cervical spine were also generated. COMPARISON:  01/18/2017 head CT and MRI FINDINGS: CT HEAD FINDINGS Brain: Known intra-axial mass lesions in the right frontal and left occipital lobes are again visualized. The largely cystic lesion in the left occipital lobe currently measures 4.7 x 2.8 x 3.7 cm versus 4.8 x 2.6 x 4.5 cm and demonstrates a mild-to-moderate amount of vasogenic edema without midline shift. No hemorrhage is noted. The mixed attenuating partially solid and partially cystic intra-axial right frontal lobe mass is estimated at 3.5 x 3 x 3.3 cm versus 4.8 x 4.2 x 4.4 cm previously, slightly smaller  in appearance. It also demonstrates a moderate amount of vasogenic edema. Less right to left midline shift is demonstrated now approximately 2 mm. Slight positive mass effect on the frontal horn of the right lateral ventricle as before though less so than on prior. No extra-axial fluid collections. No large vascular territory infarcts. No effacement of the basal cisterns. Fourth ventricle is midline. Vascular: Mild calcific atherosclerosis of the cavernous and paraclinoid internal carotid arteries. No hyperdense vessels. Skull: No fracture, suspicious lytic or blastic lesion of the skull. Sinuses/Orbits: Limited by motion artifacts. No significant paranasal sinus mucosal disease. Mastoids appear clear. Other: None CT CERVICAL SPINE FINDINGS Alignment: Normal. Skull base and vertebrae: No acute fracture. No primary bone lesion or focal pathologic process. C7 was initially incompletely included on the cervical spine study an additional views to include the thoracic inlet and C7 were acquired. Soft tissues and spinal canal: No prevertebral fluid or swelling. No visible canal hematoma. Disc levels: No focal disc herniation, central canal stenosis or significant neural foraminal encroachment. Upper chest: Scarring at the apices with mild biapical emphysematous change. Other: None IMPRESSION: 1. Known intra-axial mass lesions in the right frontal and left occipital lobes as above described, the right frontal lobe now measuring 4.7 x 2.8 x 3.7 cm and slightly smaller in appearance. Similarly a smaller cystic mass in the left occipital lobe now estimated at 3.5 x 3 x 3.3 cm is also noted within adjacent vasogenic edema as before. Less right to left midline shift now approximately 2 mm. 2. No new nor acute appearing CT abnormality within the brain. 3. No acute cervical spine fracture, lytic or blastic disease. No malalignment of the cervical spine. Electronically Signed   By: Ashley Royalty M.D.   On: 04/25/2017 18:54   Mr  Jeri Cos CZ Contrast  Result Date: 04/26/2017 CLINICAL DATA:  Initial evaluation for brain metastases. EXAM: MRI HEAD WITHOUT AND WITH CONTRAST TECHNIQUE: Multiplanar, multiecho pulse sequences of the brain and surrounding structures were obtained without and with intravenous contrast. CONTRAST:  82mL MULTIHANCE GADOBENATE DIMEGLUMINE 529 MG/ML IV SOLN COMPARISON:  Prior CT from 04/25/2017 and previous MRI from 01/18/2017. FINDINGS: Brain: Large heterogeneous [place] enhancing metastatic lesion at the anterior right frontal lobe again seen. This measures 3.9 x  3.4 x 3.8 cm on today's study (series 13, image 13). Internal susceptibility artifact with intrinsic T1 signal intensity consistent with necrosis and blood products. Large amount of surrounding vasogenic edema. Overall, size of this lesion is decreased relative to previous MRI (previously 5.1 x 4.4 x 5.2 cm). Associated vasogenic edema slightly improved as well. Large cystic metastasis at the left occipital lobe again seen, measuring 5.0 x 2.8 x 4.1 cm (series 13, image 12). This is also slightly decreased in size (previously 5.7 x 3.4 x 5.0 cm). Surrounding vasogenic edema about this lesion is increased. Previously noted small 5 mm metastasis at the parasagittal left frontal lobe no longer seen. No other definite new lesions. Single 4 mm focus of nodular enhancement at the parasagittal left frontal lobe on coronal sequence noted (series 14, image 25), not seen on corresponding sequences, and suspected to be artifactual. No evidence for acute infarct. Gray-white matter differentiation otherwise maintained. Persistent 5 mm of right-to-left midline shift. No hydrocephalus or ventricular trapping. No extra-axial fluid collection. Vascular: Major intracranial vascular flow voids are maintained. Skull and upper cervical spine: Craniocervical junction within normal limits. Visualized upper cervical spine unremarkable. Bone marrow signal intensity normal. No scalp soft  tissue abnormality. Sinuses/Orbits: Globes and orbital soft tissues within normal limits. Mild scattered mucosal thickening within the paranasal sinuses. Paranasal sinuses are otherwise clear. No mastoid effusion. Inner ear structures normal. Other: None. IMPRESSION: 1. Two total intracranial metastases involving the right frontal and left occipital lobes as above. Overall, these lesions are decreased in size relative to previous MRI from 01/18/2017, with nonvisualization of a smaller third lesion seen on that previous exam, consistent with interval response to therapy. Vasogenic edema about the right frontal lesion is slightly improved, while the edema about the left occipital lesion is slightly worsened. Persistent 5 mm right-to-left shift. No hydrocephalus or ventricular trapping. 2. No other acute intracranial process. Electronically Signed   By: Jeannine Boga M.D.   On: 04/26/2017 02:40   Dg Chest Portable 1 View  Result Date: 04/25/2017 CLINICAL DATA:  Stage IV brain and lung cancer altered mental status possible seizure EXAM: PORTABLE CHEST 1 VIEW COMPARISON:  03/25/2017, 01/21/2017 FINDINGS: Hyperinflation. Left upper lobe lung mass measuring approximately 5 cm. Sclerotic lesion in the proximal left humerus unchanged. No pneumothorax. Apical scarring on the left. Stable cardiomediastinal silhouette. IMPRESSION: 1. Slight decrease in size of left upper lobe lung mass radiographically. 2. Hyperinflation without acute infiltrate. Electronically Signed   By: Donavan Foil M.D.   On: 04/25/2017 18:13    Assessment: 46 y.o.   1. Acute encephalopathy, possible seizure 2. 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 Patient declined chemotherapy. 4. Mild anemia    Plan:  -Brain MRI showed stable brain metastasis, and persistent midline shift. She has received whole  brain radiation, I do not think she needs more radiation at this point. -Patient was previously on hospice care, which was stopped 2 days ago due to her need for further workup for her headaches. -I recommend her to resume hospice care -Patient previously declined chemotherapy, the goal of therapy is palliative. -she is DNR/DNI, per previous discussion  -I will follow up as needed.    Truitt Merle, MD 04/26/2017  8:40 PM

## 2017-04-26 NOTE — Progress Notes (Signed)
SLP Cancellation Note  Patient Details Name: Erika Hunter MRN: [id number] DOB: 01-Dec-1970   Cancelled treatment:       Reason Eval/Treat Not Completed: Patient declined, no reason specified   Elvina Sidle, M.S., CCC-SLP 04/26/2017, 3:27 PM

## 2017-04-26 NOTE — Progress Notes (Signed)
History Per Consult Note: Erika Hunter is a 46 y.o. 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 She has been in hospice, but was scheduled to get an MRI for consideration of palliative radiation. She has been titrating down on her steroids. Since arrival to the emergency department, she has been awake and interactive but does not follow commands and seems partially aphasic.  Apparently in the plan has been to give palliative radiation to try and reduce some the symptoms associated with her brain tumors, with the understanding that it is not going to change her ultimate outcome. She will have the plan of re-enrolling in hospice following radiation.  Subjective: The patient has a friend at the bedside. She states that she has been with the patient since admission. She has not seen any seizure activity. The patient has not really communicated with her. I asked the patient how she felt to which he replied that she was fine. She did not answer any further questions. She did not cooperate with an exam. The patient's nurse reported similar interaction.  Objective: Current vital signs: BP (!) 141/81 (BP Location: Left Arm)   Pulse 74   Temp 97.7 F (36.5 C) (Axillary)   Resp 20   LMP 05/01/2012   SpO2 96%  Vital signs in last 24 hours: Temp:  [97.7 F (36.5 C)-98.9 F (37.2 C)] 97.7 F (36.5 C) (06/08 5643) Pulse Rate:  [74-111] 74 (06/08 0614) Resp:  [17-24] 20 (06/08 0614) BP: (129-164)/(72-98) 141/81 (06/08 0614) SpO2:  [96 %-100 %] 96 % (06/08 0614)  Intake/Output from previous day: 06/07 0701 - 06/08 0700 In: 1638.8 [I.V.:831.3; IV Piggyback:807.5] Out: -  Intake/Output this shift: No intake/output data recorded. Nutritional status: Diet NPO time specified  Physical Exam The patient would not cooperate with an exam at this time.  Neurologic Exam: The patient would not cooperate with exam at this  time.  Lab Results: Basic Metabolic Panel:  Recent Labs Lab 04/24/17 1328 04/25/17 1746 04/26/17 0516  NA 138 134* 137  K 2.2* 2.6* 3.7  CL  --  96* 99*  CO2 34* 26 25  GLUCOSE 78 162* 154*  BUN 5.2* <5* <5*  CREATININE 0.6 0.66 0.50  CALCIUM 9.7 9.1 8.8*  MG  --   --  1.8    Liver Function Tests:  Recent Labs Lab 04/24/17 1328 04/25/17 1746  AST 11 20  ALT 7 10*  ALKPHOS 97 86  BILITOT 0.46 0.7  PROT 6.6 6.1*  ALBUMIN 3.2* 3.2*   No results for input(s): LIPASE, AMYLASE in the last 168 hours. No results for input(s): AMMONIA in the last 168 hours.  CBC:  Recent Labs Lab 04/24/17 1328 04/25/17 1746 04/26/17 0516  WBC 6.8 18.7* 10.2  NEUTROABS 5.3 17.0* 9.5*  HGB 12.9 12.8 11.9*  HCT 38.8 39.4 36.3  MCV 92.6 92.7 91.0  PLT 376 436* 359    Cardiac Enzymes:  Recent Labs Lab 04/25/17 1746  CKTOTAL 48    Lipid Panel: No results for input(s): CHOL, TRIG, HDL, CHOLHDL, VLDL, LDLCALC in the last 168 hours.  CBG: No results for input(s): GLUCAP in the last 168 hours.  Microbiology: Results for orders placed or performed during the hospital encounter of 03/25/17  Blood Culture (routine x 2)     Status: None   Collection Time: 03/25/17  7:40 PM  Result Value Ref Range Status   Specimen  Description BLOOD RIGHT HAND  Final   Special Requests IN PEDIATRIC BOTTLE BCAV  Final   Culture   Final    NO GROWTH 5 DAYS Performed at [facility], [place] [address], [place], [place] [address]    Report Status 03/31/2017 FINAL  Final  Blood Culture (routine x 2)     Status: None   Collection Time: 03/25/17  7:45 PM  Result Value Ref Range Status   Specimen Description BLOOD LEFT AC  Final   Special Requests BOTTLES DRAWN AEROBIC AND ANAEROBIC BCAV  Final   Culture   Final    NO GROWTH 5 DAYS Performed at [facility], [place] [address] [place], [place] [address]    Report Status 03/31/2017 FINAL  Final  Surgical PCR screen     Status: None    Collection Time: 03/26/17 11:00 AM  Result Value Ref Range Status   MRSA, PCR NEGATIVE NEGATIVE Final   Staphylococcus aureus NEGATIVE NEGATIVE Final    Comment:        The Xpert SA Assay (FDA approved for NASAL specimens in patients over 34 years of age), is one component of a comprehensive surveillance program.  Test performance has been validated by [facility] for patients greater than or equal to 13 year old. It is not intended to diagnose infection nor to guide or monitor treatment.   Respiratory Panel by PCR     Status: None   Collection Time: 03/26/17 11:29 AM  Result Value Ref Range Status   Adenovirus NOT DETECTED NOT DETECTED Final   Coronavirus 229E NOT DETECTED NOT DETECTED Final   Coronavirus HKU1 NOT DETECTED NOT DETECTED Final   Coronavirus NL63 NOT DETECTED NOT DETECTED Final   Coronavirus OC43 NOT DETECTED NOT DETECTED Final   Metapneumovirus NOT DETECTED NOT DETECTED Final   Rhinovirus / Enterovirus NOT DETECTED NOT DETECTED Final   Influenza A NOT DETECTED NOT DETECTED Final   Influenza B NOT DETECTED NOT DETECTED Final   Parainfluenza Virus 1 NOT DETECTED NOT DETECTED Final   Parainfluenza Virus 2 NOT DETECTED NOT DETECTED Final   Parainfluenza Virus 3 NOT DETECTED NOT DETECTED Final   Parainfluenza Virus 4 NOT DETECTED NOT DETECTED Final   Respiratory Syncytial Virus NOT DETECTED NOT DETECTED Final   Bordetella pertussis NOT DETECTED NOT DETECTED Final   Chlamydophila pneumoniae NOT DETECTED NOT DETECTED Final   Mycoplasma pneumoniae NOT DETECTED NOT DETECTED Final    Comment: Performed at [facility], [place] [address], [place], [place] [address]  Culture, sputum-assessment     Status: None   Collection Time: 03/27/17  9:11 AM  Result Value Ref Range Status   Specimen Description SPUTUM  Final   Special Requests Immunocompromised  Final   Sputum evaluation THIS SPECIMEN IS ACCEPTABLE FOR SPUTUM CULTURE  Final   Report Status 03/27/2017 FINAL   Final  Culture, respiratory (NON-Expectorated)     Status: None   Collection Time: 03/27/17  9:11 AM  Result Value Ref Range Status   Specimen Description SPUTUM  Final   Special Requests Immunocompromised Reflexed from [id number]  Final   Gram Stain   Final    FEW WBC PRESENT, PREDOMINANTLY PMN FEW SQUAMOUS EPITHELIAL CELLS PRESENT FEW YEAST RARE GRAM POSITIVE COCCI IN PAIRS RARE GRAM POSITIVE RODS Performed at [facility], [place] [address] [place],  [address]    Culture MODERATE CANDIDA ALBICANS  Final   Report Status 03/29/2017 FINAL  Final  Coagulation Studies: No results for input(s): LABPROT, INR in the last 72 hours.  Imaging:  Ct Head Wo Contrast 04/25/2017 1. Known intra-axial mass lesions in the right frontal and left occipital lobes as above described, the right frontal lobe now measuring 4.7 x 2.8 x 3.7 cm and slightly smaller in appearance. Similarly a smaller cystic mass in the left occipital lobe now estimated at 3.5 x 3 x 3.3 cm is also noted within adjacent vasogenic edema as before. Less right to left midline shift now approximately 2 mm.  2. No new nor acute appearing CT abnormality within the brain.    Ct Cervical Spine Wo Contrast 04/25/2017 No acute cervical spine fracture, lytic or blastic disease. No malalignment of the cervical spine.   Mr Kizzie Fantasia Contrast 04/26/2017 1. Two total intracranial metastases involving the right frontal and left occipital lobes as above. Overall, these lesions are decreased in size relative to previous MRI from 01/18/2017, with nonvisualization of a smaller third lesion seen on that previous exam, consistent with interval response to therapy. Vasogenic edema about the right frontal lesion is slightly improved, while the edema about the left occipital lesion is slightly worsened. Persistent 5 mm right-to-left shift. No hydrocephalus or ventricular trapping.  2. No other acute intracranial process.    Dg Chest Portable  1 View 04/25/2017 1. Slight decrease in size of left upper lobe lung mass radiographically.  2. Hyperinflation without acute infiltrate.     Medications:  Current Facility-Administered Medications  Medication Dose Route Frequency Provider Last Rate Last Dose  . 0.9 %  sodium chloride infusion  75 mL/hr Intravenous Continuous Fuller Plan A, MD 75 mL/hr at 04/25/17 2225 75 mL/hr at 04/25/17 2225  . dexamethasone (DECADRON) injection 6 mg  6 mg Intravenous Q6H Greta Doom, MD   6 mg at 04/26/17 0514  . enoxaparin (LOVENOX) injection 40 mg  40 mg Subcutaneous QHS Smith, Rondell A, MD   40 mg at 04/25/17 2309  . folic acid (FOLVITE) tablet 1 mg  1 mg Oral Daily Smith, Rondell A, MD      . levETIRAcetam (KEPPRA) 750 mg in sodium chloride 0.9 % 100 mL IVPB  750 mg Intravenous Q12H Greta Doom, MD   Stopped at 04/25/17 2315  . LORazepam (ATIVAN) tablet 1 mg  1 mg Oral Q6H PRN Norval Morton, MD       Or  . LORazepam (ATIVAN) injection 1 mg  1 mg Intravenous Q6H PRN Smith, Rondell A, MD      . LORazepam (ATIVAN) injection 1-2 mg  1-2 mg Intravenous Q2H PRN Fuller Plan A, MD      . multivitamin with minerals tablet 1 tablet  1 tablet Oral Daily Smith, Rondell A, MD      . ondansetron (ZOFRAN) tablet 4 mg  4 mg Oral Q6H PRN Norval Morton, MD       Or  . ondansetron (ZOFRAN) injection 4 mg  4 mg Intravenous Q6H PRN Smith, Rondell A, MD      . thiamine (VITAMIN B-1) tablet 100 mg  100 mg Oral Daily Smith, Rondell A, MD       Or  . thiamine (B-1) injection 100 mg  100 mg Intravenous Daily Smith, Rondell A, MD         Assessment/Plan:  Keppra increased from 500 mg PO Bid PTA to 750 mg IV Q12 hrs. PRN Ativan.  Consider EEG today per Dr Cecil Cobbs consult note.   Decadron  increased from 4 mg BID PTA to 6 mg IV Q 6 hours.   No further seizure activity reported.  Note from Dr. Shon Hale to follow.   Mikey Bussing PA-C [facility] Pager [phone] 04/26/2017, 10:20 AM   Neurology Attending Addendum  This patient was seen, examined, and d/w PA. I have reviewed the note and agree with the findings, assessment and plan as documented with the following additions.   According to her nurse, the patient has been minimally interactive today. At times, she becomes mildly agitated. She does not have any evidence of any seizure activity. She does not participate with my evaluation, instead lying prone on the bed. She denies any pain when I ask but really doesn't answer any other questions for me. Exam is therefore limited.  Imaging: I have personally and independently reviewed the MRI scan of the brain with and without contrast from earlier today. This shows 2 large metastatic lesions. One is seen in the anterior right frontal lobe measuring 3.9 x 3.4 x 3.8 cm. The second is in the left occipital lobe and measures 5 x 2.8 x 4.1 cm. Both of these lesions have decreased somewhat in size when compared to a prior scan from 01/18/17. Both have a significant degree of surrounding vasogenic edema and local mass effect. The edema has also improved slightly when compared to the previous scan.  Pertinent labs: CBC notable for hemoglobin 11.9. White blood cell count has decreased from 10.2 from 18.7 yesterday BMP notable for chloride 99, glucose 154 Magnesium 1.8  Impression:  1. Acute encephalopathy. Etiology unclear but suspect possible seizure. 2. Brain metastases 3. Cerebral edema secondary to brain metastases  Recommendations:  As per PA note. Continue increased doses of Keppra and Decadron. Would continue higher dose Decadron for 2-3 days and then return to her home dose of 4 mg twice daily given that MRI scan of the brain does not support increased edema when compared to previous studies. Continue seizure precautions. Appreciate palliative care assistance. The overarching goal in this case should be maintaining absolute comfort for the  patient.

## 2017-04-26 NOTE — Telephone Encounter (Signed)
Per 6/6 los - no additional appts added. Brain MRI w wo contrast within the next 2-3 days  - already scheduled.

## 2017-04-26 NOTE — Consult Note (Signed)
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 She was being followed by Hospice, however had increasingly severe headaches. She met with her Oncologist, Dr. Burr Medico, and they decided to stop Hospice services and pursue an MRI and consideration of Palliative radiation if warranted. The goal would be to improve her symptoms, then resume hospice services. Unfortunately, one day after this meeting she was found down at home and incontinent of urine and emesis. She was also minimally responsive. She was brought the to ED on 04/25/2017 and admitted under observation status. Neurology consulted. Brain MRI obtained. Pt's mentation remains poor. Palliative consulted to assist in goals of care.  Clinical Assessment and Goals of Care: In reviewing her chart and then in speaking with Radiation Oncology Shona Simpson) and her primary Oncologist (Dr. Burr Medico), it seems that [since last admission] [name] continued to focus on comfort measures and had been utilizing Hospice services at home. She then developed severe headaches, which prompted her to present to Oncology. In order to evaluate these headaches and consider palliative radiation treatment if indicated, she had to stop her Hospice benefits. The plan had been to resume Hospice after the MRI/radiation. Now here, it remains a bit unclear regarding the etiology of her mental status changes.  I was able to review all of this  information with her two close friends at her bedside, both of whom I've met in prior admissions. I also called her daughter, Erika Hunter, who is her HCPOA and resides in [place]. I shared the results of the MRI, but clarified that Oncology would need to look at the images and involve Radiation Oncology as necessary. Erika Hunter has an excellent understanding of her mother's health issues, and is hopeful her mental status will improve. She is also committed to ensuring her mother's comfort, and would only want to pursue radiation if it would be beneficial for symptom management. The plan overall is to resume Hospice (either at home, or at a residential hospice facility if needed), however at this point we are in a place of supportive care and watchful waiting to determine if she will have any mental recovery, and if there is any benefit to further radiation.   Primary Decision Maker NEXT OF KIN, pt's daughter: Erika Hunter.    SUMMARY OF RECOMMENDATIONS    Oncology notified of admission and completion of brain MRI  Continue supportive care and evaluation of mental status changes; Erika Hunter is hopeful for mentation improvement, but the overall goal is ensuring comfort  I will follow-up on Sunday. If Palliative is needed on Saturday, please call the team at [phone].  Code Status/Advance Care Planning:  DNR  Palliative Prophylaxis:   Aspiration, Frequent Pain Assessment and Oral Care  Psycho-social/Spiritual:   Desire for further Chaplaincy support:no  Additional Recommendations: Education on Hospice  Prognosis:   < 6 months  Discharge Planning: To Be Determined      Primary Diagnoses: Present on Admission: . Post-ictal confusion . Hypokalemia . Small cell lung cancer, left ([place]) . Brain metastasis ([place])   I have  reviewed the medical record, interviewed the patient and family, and examined the patient. The following aspects are pertinent.  Past Medical History:  Diagnosis Date  . Alcohol  dependence ([place]) 01/24/2017  . Anxiety    panic disorder  . Asthma   . Brain cancer ([place])   . Cervical dysplasia 11/19/1988   S/P conization  . COPD (chronic obstructive pulmonary disease) ([place]) 01/24/2017  . Depression   . Hyperlipidemia   . Lung cancer ([place])   . Steatohepatitis    h/o heavy alcohol use, hepatitis profile negative for A, B   Social History   Social History  . Marital status: Single    Spouse name: n/a  . Number of children: 2  . Years of education: High School   Occupational History  . Holiday representative   Social History Main Topics  . Smoking status: Current Every Day Smoker    Packs/day: 1.00    Years: 25.00  . Smokeless tobacco: Never Used  . Alcohol use Yes     Comment: wine - few glasses/day  . Drug use: No  . Sexual activity: Yes   Other Topics Concern  . Not on file   Social History Narrative   Marital status: divorced; dating, moved to [place] from [place] to be with her boyfriend..      Children; 2 children; no grandchildren      Lives: with a co-worker      Employment: clerk at gas station x 5.5 years      Tobacco; 3/4 ppd since 46 yo      Alcohol:  3 glasses wine per day.      Drugs: none      Exercise:  none   Family History  Problem Relation Age of Onset  . COPD Mother   . COPD Sister   . Cancer Sister 30       ovarian  . Diabetes Sister   . Mental illness Sister    Scheduled Meds: . dexamethasone  6 mg Intravenous Q6H  . enoxaparin (LOVENOX) injection  40 mg Subcutaneous QHS  . folic acid  1 mg Oral Daily  . multivitamin with minerals  1 tablet Oral Daily  . thiamine  100 mg Oral Daily   Or  . thiamine  100 mg Intravenous Daily   Continuous Infusions: . sodium chloride 75 mL/hr (04/25/17 2225)  . levETIRAcetam Stopped (04/25/17 2315)   PRN Meds:.LORazepam **OR** LORazepam, LORazepam, ondansetron **OR** ondansetron (ZOFRAN) IV No Known Allergies   Review of Systems: Unable to obtain given pt's mental  status  Physical Exam  Constitutional: Vital signs are normal. She appears well-developed. She appears listless. She has a sickly appearance. No distress.  HENT:  Head: Normocephalic and atraumatic. Hair is abnormal.  Mouth/Throat: Oropharynx is clear and moist. Abnormal dentition. No oropharyngeal exudate.  Eyes: EOM are normal.  Neck: Normal range of motion. Neck supple.  Cardiovascular: Normal rate and regular rhythm.   Pulmonary/Chest: Effort normal and breath sounds normal.  Abdominal: Soft. Bowel sounds are normal.  Musculoskeletal: She exhibits no edema.  UTA, pt not following commands  Neurological: She appears listless.  Variable responsiveness. Will intermittently open eyes to voice and spontaneously to look around the room. Not answering questions or following commands  Skin: Skin is warm and dry. There is pallor.  Psychiatric:  Appears calm.     Vital Signs: BP (!) 141/81 (BP Location: Left Arm)   Pulse 74   Temp  97.7 F (36.5 C) (Axillary)   Resp 20   LMP 05/01/2012   SpO2 96%  Pain Assessment: Faces     SpO2: SpO2: 96 % O2 Device:SpO2: 96 % O2 Flow Rate: .   IO: Intake/output summary:  Intake/Output Summary (Last 24 hours) at 04/26/17 0848 Last data filed at 04/26/17 0530  Gross per 24 hour  Intake          1638.75 ml  Output                0 ml  Net          1638.75 ml    LBM: Last BM Date: 04/25/17 Baseline Weight:   Most recent weight:         Time Total: 70 minutes Greater than 50%  of this time was spent counseling and coordinating care related to the above assessment and plan.  Signed by: Charlynn Court, NP Palliative Medicine Team Pager # [phone] (M-F 7a-5p) Team Phone # [phone] (Nights/Weekends)

## 2017-04-26 NOTE — Progress Notes (Signed)
PROGRESS NOTE    [name]  XTK:[id number]  DOB: Jul 10, 1971  DOA: 04/25/2017 PCP: Wardell Honour, MD   Brief Admission Hx: Erika Hunter is a 46 y.o. 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 She has been in hospice, but was scheduled to get an MRI for consideration of palliative radiation. She has been titrating down on her steroids. Since arrival to the emergency department, she has been awake and interactive but does not follow commands and seems partially aphasic.  Apparently in the plan has been to give palliative radiation to try and reduce some the symptoms associated with her brain tumors, with the understanding that it is not going to change her ultimate outcome. She will have the plan of re-enrolling in hospice following radiation.  MDM/Assessment & Plan:   Suspected Seizures: Acute. Patient found down with vomitus and urinary incontinence. CT scan showing vasogenic edema with less right to left midline shift than previous scans. Neurology consult and recommended and given the patient 10 mg of Decadron along with 100 mg of Keppra IV. Patient previously on 500 mg twice a day. On physical exam patient stage appears to possibly be post ictal. - Admit to telemetry bed - Seizure admission order set initiated  - Ativan IV prn seizure activity - appreciate neurology consultative services, for follow-up for further recommendations  Acute encephalopathy/suspected postictal: Patient not back to baseline at this time. - NPO pending SLP eval. - neuro checks, consider EEG per neuro. Neurology team following.   Leukocytosis: Previously elevated at 18.7. Suspect this could be reactive due to suspected seizure and patient being on chronic steroids. Chest x-ray shows no acute infiltrate. - Check UA - Rechecked CBC with normal WBC.  Hypokalemia: Acute. Potassium initially 2.6 on admission. - Potassium  chloride IV 60 mEq x 1 dose. - Normal magnesium level  Stage IV SCLC metastatic cancer, Goals of care: Patient recently was taken off of hospice to obtain MRI. We'll need to discuss goals of care. - Palliative care consult appreciated (see notes)  Alcohol and tobacco abuse history - CWIA  DVT prophylaxis:SCD   Code Status: DNR  Family Communication: Discuss care with family present at bedside Disposition Plan: Likely discharge back home with hospice in 1-2 days Consults called: neurology Admission status: Observation  Subjective: Pt still obtunded.    Objective: Vitals:   04/26/17 0614 04/26/17 1032 04/26/17 1100 04/26/17 1435  BP: (!) 141/81 127/73  132/60  Pulse: 74 (!) 118  84  Resp: 20 19  19   Temp: 97.7 F (36.5 C) 98.7 F (37.1 C)  99.5 F (37.5 C)  TempSrc: Axillary Axillary  Axillary  SpO2: 96% 96%  98%  Weight:   53.2 kg (117 lb 4.8 oz)   Height:   5\' 5"  (1.651 m)     Intake/Output Summary (Last 24 hours) at 04/26/17 1512 Last data filed at 04/26/17 1500  Gross per 24 hour  Intake          2458.75 ml  Output                0 ml  Net          2458.75 ml   Filed Weights   04/26/17 1100  Weight: 53.2 kg (117 lb 4.8 oz)     REVIEW OF SYSTEMS  As per history otherwise all reviewed and reported negative  Exam:  General exam:  emaciated chronically ill appearing.   Respiratory system:  No increased work of breathing. Cardiovascular system: S1 & S2 heard, tachycardic. Gastrointestinal system: Abdomen is nondistended, soft and nontender. Normal bowel sounds heard. Central nervous system: obtunded.  Extremities: no cyanosis.   Data Reviewed: Basic Metabolic Panel:  Recent Labs Lab 04/24/17 1328 04/25/17 1746 04/26/17 0516  NA 138 134* 137  K 2.2* 2.6* 3.7  CL  --  96* 99*  CO2 34* 26 25  GLUCOSE 78 162* 154*  BUN 5.2* <5* <5*  CREATININE 0.6 0.66 0.50  CALCIUM 9.7 9.1 8.8*  MG  --   --  1.8   Liver Function Tests:  Recent Labs Lab  04/24/17 1328 04/25/17 1746  AST 11 20  ALT 7 10*  ALKPHOS 97 86  BILITOT 0.46 0.7  PROT 6.6 6.1*  ALBUMIN 3.2* 3.2*   No results for input(s): LIPASE, AMYLASE in the last 168 hours. No results for input(s): AMMONIA in the last 168 hours. CBC:  Recent Labs Lab 04/24/17 1328 04/25/17 1746 04/26/17 0516  WBC 6.8 18.7* 10.2  NEUTROABS 5.3 17.0* 9.5*  HGB 12.9 12.8 11.9*  HCT 38.8 39.4 36.3  MCV 92.6 92.7 91.0  PLT 376 436* 359   Cardiac Enzymes:  Recent Labs Lab 04/25/17 1746  CKTOTAL 48   CBG (last 3)  No results for input(s): GLUCAP in the last 72 hours. No results found for this or any previous visit (from the past 240 hour(s)).   Studies: Ct Head Wo Contrast  Result Date: 04/25/2017 CLINICAL DATA:  Patient found on floor, unresponsive. History of stage IV brain and lung cancer. EXAM: CT HEAD WITHOUT CONTRAST CT CERVICAL SPINE WITHOUT CONTRAST TECHNIQUE: Multidetector CT imaging of the head and cervical spine was performed following the standard protocol without intravenous contrast. Multiplanar CT image reconstructions of the cervical spine were also generated. COMPARISON:  01/18/2017 head CT and MRI FINDINGS: CT HEAD FINDINGS Brain: Known intra-axial mass lesions in the right frontal and left occipital lobes are again visualized. The largely cystic lesion in the left occipital lobe currently measures 4.7 x 2.8 x 3.7 cm versus 4.8 x 2.6 x 4.5 cm and demonstrates a mild-to-moderate amount of vasogenic edema without midline shift. No hemorrhage is noted. The mixed attenuating partially solid and partially cystic intra-axial right frontal lobe mass is estimated at 3.5 x 3 x 3.3 cm versus 4.8 x 4.2 x 4.4 cm previously, slightly smaller in appearance. It also demonstrates a moderate amount of vasogenic edema. Less right to left midline shift is demonstrated now approximately 2 mm. Slight positive mass effect on the frontal horn of the right lateral ventricle as before though less  so than on prior. No extra-axial fluid collections. No large vascular territory infarcts. No effacement of the basal cisterns. Fourth ventricle is midline. Vascular: Mild calcific atherosclerosis of the cavernous and paraclinoid internal carotid arteries. No hyperdense vessels. Skull: No fracture, suspicious lytic or blastic lesion of the skull. Sinuses/Orbits: Limited by motion artifacts. No significant paranasal sinus mucosal disease. Mastoids appear clear. Other: None CT CERVICAL SPINE FINDINGS Alignment: Normal. Skull base and vertebrae: No acute fracture. No primary bone lesion or focal pathologic process. C7 was initially incompletely included on the cervical spine study an additional views to include the thoracic inlet and C7 were acquired. Soft tissues and spinal canal: No prevertebral fluid or swelling. No visible canal hematoma. Disc levels: No focal disc herniation, central canal stenosis or significant neural foraminal encroachment. Upper chest: Scarring at  the apices with mild biapical emphysematous change. Other: None IMPRESSION: 1. Known intra-axial mass lesions in the right frontal and left occipital lobes as above described, the right frontal lobe now measuring 4.7 x 2.8 x 3.7 cm and slightly smaller in appearance. Similarly a smaller cystic mass in the left occipital lobe now estimated at 3.5 x 3 x 3.3 cm is also noted within adjacent vasogenic edema as before. Less right to left midline shift now approximately 2 mm. 2. No new nor acute appearing CT abnormality within the brain. 3. No acute cervical spine fracture, lytic or blastic disease. No malalignment of the cervical spine. Electronically Signed   By: Ashley Royalty M.D.   On: 04/25/2017 18:54   Ct Cervical Spine Wo Contrast  Result Date: 04/25/2017 CLINICAL DATA:  Patient found on floor, unresponsive. History of stage IV brain and lung cancer. EXAM: CT HEAD WITHOUT CONTRAST CT CERVICAL SPINE WITHOUT CONTRAST TECHNIQUE: Multidetector CT  imaging of the head and cervical spine was performed following the standard protocol without intravenous contrast. Multiplanar CT image reconstructions of the cervical spine were also generated. COMPARISON:  01/18/2017 head CT and MRI FINDINGS: CT HEAD FINDINGS Brain: Known intra-axial mass lesions in the right frontal and left occipital lobes are again visualized. The largely cystic lesion in the left occipital lobe currently measures 4.7 x 2.8 x 3.7 cm versus 4.8 x 2.6 x 4.5 cm and demonstrates a mild-to-moderate amount of vasogenic edema without midline shift. No hemorrhage is noted. The mixed attenuating partially solid and partially cystic intra-axial right frontal lobe mass is estimated at 3.5 x 3 x 3.3 cm versus 4.8 x 4.2 x 4.4 cm previously, slightly smaller in appearance. It also demonstrates a moderate amount of vasogenic edema. Less right to left midline shift is demonstrated now approximately 2 mm. Slight positive mass effect on the frontal horn of the right lateral ventricle as before though less so than on prior. No extra-axial fluid collections. No large vascular territory infarcts. No effacement of the basal cisterns. Fourth ventricle is midline. Vascular: Mild calcific atherosclerosis of the cavernous and paraclinoid internal carotid arteries. No hyperdense vessels. Skull: No fracture, suspicious lytic or blastic lesion of the skull. Sinuses/Orbits: Limited by motion artifacts. No significant paranasal sinus mucosal disease. Mastoids appear clear. Other: None CT CERVICAL SPINE FINDINGS Alignment: Normal. Skull base and vertebrae: No acute fracture. No primary bone lesion or focal pathologic process. C7 was initially incompletely included on the cervical spine study an additional views to include the thoracic inlet and C7 were acquired. Soft tissues and spinal canal: No prevertebral fluid or swelling. No visible canal hematoma. Disc levels: No focal disc herniation, central canal stenosis or  significant neural foraminal encroachment. Upper chest: Scarring at the apices with mild biapical emphysematous change. Other: None IMPRESSION: 1. Known intra-axial mass lesions in the right frontal and left occipital lobes as above described, the right frontal lobe now measuring 4.7 x 2.8 x 3.7 cm and slightly smaller in appearance. Similarly a smaller cystic mass in the left occipital lobe now estimated at 3.5 x 3 x 3.3 cm is also noted within adjacent vasogenic edema as before. Less right to left midline shift now approximately 2 mm. 2. No new nor acute appearing CT abnormality within the brain. 3. No acute cervical spine fracture, lytic or blastic disease. No malalignment of the cervical spine. Electronically Signed   By: Ashley Royalty M.D.   On: 04/25/2017 18:54   Mr Jeri Cos VW Contrast  Result  Date: 04/26/2017 CLINICAL DATA:  Initial evaluation for brain metastases. EXAM: MRI HEAD WITHOUT AND WITH CONTRAST TECHNIQUE: Multiplanar, multiecho pulse sequences of the brain and surrounding structures were obtained without and with intravenous contrast. CONTRAST:  79mL MULTIHANCE GADOBENATE DIMEGLUMINE 529 MG/ML IV SOLN COMPARISON:  Prior CT from 04/25/2017 and previous MRI from 01/18/2017. FINDINGS: Brain: Large heterogeneous [place]ward enhancing metastatic lesion at the anterior right frontal lobe again seen. This measures 3.9 x 3.4 x 3.8 cm on today's study (series 13, image 13). Internal susceptibility artifact with intrinsic T1 signal intensity consistent with necrosis and blood products. Large amount of surrounding vasogenic edema. Overall, size of this lesion is decreased relative to previous MRI (previously 5.1 x 4.4 x 5.2 cm). Associated vasogenic edema slightly improved as well. Large cystic metastasis at the left occipital lobe again seen, measuring 5.0 x 2.8 x 4.1 cm (series 13, image 12). This is also slightly decreased in size (previously 5.7 x 3.4 x 5.0 cm). Surrounding vasogenic edema about this lesion is  increased. Previously noted small 5 mm metastasis at the parasagittal left frontal lobe no longer seen. No other definite new lesions. Single 4 mm focus of nodular enhancement at the parasagittal left frontal lobe on coronal sequence noted (series 14, image 25), not seen on corresponding sequences, and suspected to be artifactual. No evidence for acute infarct. Gray-white matter differentiation otherwise maintained. Persistent 5 mm of right-to-left midline shift. No hydrocephalus or ventricular trapping. No extra-axial fluid collection. Vascular: Major intracranial vascular flow voids are maintained. Skull and upper cervical spine: Craniocervical junction within normal limits. Visualized upper cervical spine unremarkable. Bone marrow signal intensity normal. No scalp soft tissue abnormality. Sinuses/Orbits: Globes and orbital soft tissues within normal limits. Mild scattered mucosal thickening within the paranasal sinuses. Paranasal sinuses are otherwise clear. No mastoid effusion. Inner ear structures normal. Other: None. IMPRESSION: 1. Two total intracranial metastases involving the right frontal and left occipital lobes as above. Overall, these lesions are decreased in size relative to previous MRI from 01/18/2017, with nonvisualization of a smaller third lesion seen on that previous exam, consistent with interval response to therapy. Vasogenic edema about the right frontal lesion is slightly improved, while the edema about the left occipital lesion is slightly worsened. Persistent 5 mm right-to-left shift. No hydrocephalus or ventricular trapping. 2. No other acute intracranial process. Electronically Signed   By: Jeannine Boga M.D.   On: 04/26/2017 02:40   Dg Chest Portable 1 View  Result Date: 04/25/2017 CLINICAL DATA:  Stage IV brain and lung cancer altered mental status possible seizure EXAM: PORTABLE CHEST 1 VIEW COMPARISON:  03/25/2017, 01/21/2017 FINDINGS: Hyperinflation. Left upper lobe lung  mass measuring approximately 5 cm. Sclerotic lesion in the proximal left humerus unchanged. No pneumothorax. Apical scarring on the left. Stable cardiomediastinal silhouette. IMPRESSION: 1. Slight decrease in size of left upper lobe lung mass radiographically. 2. Hyperinflation without acute infiltrate. Electronically Signed   By: Donavan Foil M.D.   On: 04/25/2017 18:13   Scheduled Meds: . dexamethasone  6 mg Intravenous Q6H  . enoxaparin (LOVENOX) injection  40 mg Subcutaneous QHS  . folic acid  1 mg Oral Daily  . multivitamin with minerals  1 tablet Oral Daily  . thiamine  100 mg Oral Daily   Or  . thiamine  100 mg Intravenous Daily   Continuous Infusions: . sodium chloride 75 mL/hr (04/25/17 2225)  . levETIRAcetam Stopped (04/26/17 1204)    Principal Problem:   Seizure ([place]) Active Problems:  Small cell lung cancer, left (HCC)   Brain metastasis ([place])   Hypokalemia   Post-ictal confusion   Time spent:   Irwin Brakeman, MD, FAAFP [facility] Pager [phone]  If 7PM-7AM, please contact night-coverage [email or web address] Password [email or web address] 04/26/2017, 3:12 PM    LOS: 0 days

## 2017-04-26 NOTE — Progress Notes (Signed)
Nutrition Brief Note  Pt identified on the Malnutrition Screening Tool Report. Pt with Stage IV SCLC with metastases to brain.  Palliative Care Team consulted for goals of care. No nutrition interventions warranted at this time.  Please consult as needed.   Arthur Holms, RD, LDN Pager #: [phone] After-Hours Pager #: [phone]

## 2017-04-27 ENCOUNTER — Encounter: Payer: Self-pay | Admitting: Hematology

## 2017-04-27 LAB — GLUCOSE, CAPILLARY
GLUCOSE-CAPILLARY: 133 mg/dL — AB (ref 65–99)
GLUCOSE-CAPILLARY: 104 mg/dL — AB (ref 65–99)
Glucose-Capillary: 143 mg/dL — ABNORMAL HIGH (ref 65–99)
Glucose-Capillary: 145 mg/dL — ABNORMAL HIGH (ref 65–99)

## 2017-04-27 MED ORDER — ALPRAZOLAM 0.5 MG PO TABS: 0.5000 mg | ORAL_TABLET | Freq: Three times a day (TID) | ORAL | 0 refills | Status: AC | PRN

## 2017-04-27 MED ORDER — DEXAMETHASONE SODIUM PHOSPHATE 4 MG/ML IJ SOLN
2.0000 mg | Freq: Four times a day (QID) | INTRAMUSCULAR | Status: DC
  Administered 2017-04-27: 2 mg via INTRAVENOUS
  Filled 2017-04-27: qty 1

## 2017-04-27 NOTE — Progress Notes (Signed)
1330---[facility]) Hospital Liaison RN Visit  Notified by Geri [place], [facility], of patient, family and friends request for [facility] services at home after discharge. Chart and patient information are being reviewed with [facility] physician.   Writer spoke with patient and friend Levander Campion at bedside to review hospice services Erika Hunter and Erika Hunter verbalized understanding of the information given.Erika Hunter was previously a [facility] patient who revoked hospice benefit 04/25/17 to pursue MRI and potential for additional radiation treatment.  Per discussion, plan is for discharge home today by private vehicle.  Please send signed and completed DNR form home with patient. Patient will need prescriptions for discharge comfort medications.  DME needs have been discussed. Erika Hunter, Erika Hunter's friend, requests the following DME for delivery to the home: wheelchair, nebulizer, BSC, transfer shower chair, walker, bed with 1/2 rails upper and lower, over the bed table. [facility] has been notified and will arrange for delivery in the home today. The home address has been verified and is correct in the chart. Erika Hunter is the friend to contact to arrange time for delivery at [phone].  [facility] is aware of the above. Completed discharge summary will need to be faxed to [facility] at [phone], when final. Please notify [facility] when patient is ready to leave the unit at discharge at [phone].   [facility] information and contact numbers have been given to [place] during visit. Above information shared with Sabino Dick, CMRN.  Please call with any hospice related questions or concerns.  Thank you for the referral.  Margaretmary Eddy, [place] are on AMION.

## 2017-04-27 NOTE — Progress Notes (Signed)
Daily Progress Note   Patient Name: Erika Hunter       Date: 04/27/2017 DOB: 10/21/1971  Age: 46 y.o. MRN#: [id number] Attending Physician: Murlean Iba, MD Primary Care Physician: Wardell Honour, MD Admit Date: 04/25/2017  Reason for Consultation/Follow-up: Disposition and Psychosocial/spiritual support  Subjective: Erika Hunter is much more awake, alert, and conversational this morning. On my arrival she was sitting up in bed, eating breakfast, and talking with her friend Erika Hunter. She recognized me when I entered, called me by name, and was happy to chat. She has no acute complaints, though recognizes she is a little confused. She is eager to go home.  Length of Stay: 1  Current Medications: Scheduled Meds:  . dexamethasone  2 mg Intravenous Q6H  . enoxaparin (LOVENOX) injection  40 mg Subcutaneous QHS  . folic acid  1 mg Oral Daily  . multivitamin with minerals  1 tablet Oral Daily  . sodium chloride flush  3 mL Intravenous Q12H  . thiamine  100 mg Oral Daily   Or  . thiamine  100 mg Intravenous Daily    Continuous Infusions: . sodium chloride    . levETIRAcetam Stopped (04/26/17 2251)    PRN Meds: sodium chloride, LORazepam **OR** LORazepam, LORazepam, ondansetron **OR** ondansetron (ZOFRAN) IV, sodium chloride flush  Physical Exam        Constitutional: Vital signs are normal. She appears well-developed. She has a sickly appearance. No distress.  HENT:  Head: Normocephalic and atraumatic. Hair is abnormal.  Mouth/Throat: Oropharynx is clear and moist. Abnormal dentition. No oropharyngeal exudate.  Eyes: EOM are normal.  Neck: Normal range of motion. Neck supple.  Cardiovascular: Normal rate and regular rhythm.   Pulmonary/Chest: Effort normal and breath sounds normal.  Abdominal: Soft. Bowel sounds are normal.    Musculoskeletal: She exhibits no edema.  Moving all extremities without issue. Mild weakness. Neurological:  Alert, awake, and conversation. Oriented to person, confused on time, place, and situation. Easily reoriented and defers to friend to confirm her responses. Skin: Skin is warm and dry. There is pallor.  Psychiatric:  Calm. Language fluent and clear. Abnormal recent memory, good remote memory. Thought content normal.     Vital Signs: BP (!) 132/56 (BP Location: Left Arm)   Pulse (!) 53   Temp 98.8 F (37.1 C) (Oral)   Resp 18   Ht 5' 5"  (1.651 m)   Wt 53.2 kg (117 lb 4.8 oz)   LMP 05/01/2012   SpO2 98%   BMI 19.52 kg/m  SpO2: SpO2: 98 % O2 Device: O2 Device: Not Delivered O2 Flow Rate:    Intake/output summary:  Intake/Output Summary (Last 24 hours) at 04/27/17 1142 Last data filed at 04/26/17 2232  Gross per 24 hour  Intake              823 ml  Output                0 ml  Net              823 ml   LBM: Last BM Date: 04/25/17 Baseline Weight: Weight: 53.2 kg (117 lb 4.8 oz) Most recent weight: Weight: 53.2 kg (117 lb 4.8  oz)  Palliative Assessment/Data: PPS 50-60%   Flowsheet Rows     Most Recent Value  Intake Tab  Referral Department  Hospitalist  Unit at Time of Referral  ER  Palliative Care Primary Diagnosis  Cancer  Date Notified  04/25/17  Palliative Care Type  Return patient Palliative Care  Reason for referral  Clarify Goals of Care  Date of Admission  04/25/17  Date first seen by Palliative Care  04/26/17  # of days Palliative referral response time  1 Day(s)  # of days IP prior to Palliative referral  0  Clinical Assessment  Psychosocial & Spiritual Assessment  Palliative Care Outcomes      Patient Active Problem List   Diagnosis Date Noted  . Post-ictal confusion 04/26/2017  . Acute encephalopathy 04/26/2017  . Seizure ([place]) 04/25/2017  . HCAP (healthcare-associated pneumonia) 03/25/2017  . COPD with acute exacerbation ([place]) 03/25/2017   . Hypertensive urgency 03/25/2017  . Hypokalemia   . Orthostatic hypotension 01/26/2017  . Goals of care, counseling/discussion   . Palliative care by specialist   . Alcohol dependence ([place]) 01/24/2017  . Headache 01/24/2017  . COPD (chronic obstructive pulmonary disease) ([place]) 01/24/2017  . Brain metastasis ([place]) 01/23/2017  . Small cell lung cancer, left ([place]) 01/22/2017  . Lung mass   . Depression 09/11/2016  . History of abnormal cervical Pap smear 09/11/2016  . Hyperlipidemia with target low density lipoprotein (LDL) cholesterol less than 100 mg/dL 04/17/2012  . Generalized anxiety disorder 04/17/2012  . Tobacco user 04/17/2012    Palliative Care Assessment & Plan   HPI: 46 y.o.femalewith past medical history of 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 She was being followed by Hospice, however had increasingly severe headaches. She met with her Oncologist, Dr. Burr Medico, and they decided to stop Hospice services and pursue an MRI and consideration of Palliative radiation if warranted. The goal would be to improve her symptoms, then resume hospice services. Unfortunately, one day after this meeting she was found down at home and incontinent of urine and emesis. She was also minimally responsive. She was brought the to ED on 04/25/2017 and admitted. Neurology consulted. Brain MRI obtained. Palliative consulted to assist in goals of care.  Assessment: Erika Hunter is much better this morning, with no further work-up recommended by Neurology. Oncology also weighed-in yesterday, and felt further radiation was not needed. Dr. Burr Medico recommended resumption of Hospice services. I shared this information with Erika Hunter and her friend, Erika Hunter, at her bedside.   Erika Hunter reports that 24 hour care can be provided for  Erika Hunter at her home, and discharging home with Hospice would be their ideal plan. I shared this with the primary team, who had already contacted CM for referral to [facility] for resumption of Hospice services at discharge (she had stopped services for work-up of her headaches and consideration of palliative radiation. No further HA since admission). Once she is admitted back into Hospice, CM will arrange transportation home (expect d/c today). CM has also updated the patient's daughter, Velna Hatchet.  Recommendations/Plan:  Plan for d/c home with hospice, which is being managed by CM per their note  Friends to provide 24 hour care at home  Goals of Care and Additional Recommendations:  Limitations on Scope of Treatment: Full Comfort Care  Code Status:  DNR  Prognosis:   <  6 months  Discharge Planning:  Home with Hospice  Care plan was discussed with pt, Erika Hunter, primary team.  Thank you for allowing the Palliative Medicine Team to assist in the care of this patient.  Total time: 25 minutes    Greater than 50%  of this time was spent counseling and coordinating care related to the above assessment and plan.  Charlynn Court, NP Palliative Medicine Team [phone] pager (7a-5p) Team Phone # [phone]

## 2017-04-27 NOTE — Progress Notes (Signed)
Patient ready for discharge to home; family member at bedside; discharge instructions given and reviewed; patient dressed in paper scrubs; family or friend did not bring her clothes to wear out; patient discharged out via wheelchair accompanied by friend/family member.

## 2017-04-27 NOTE — Evaluation (Signed)
Clinical/Bedside Swallow Evaluation Patient Details  Name: Erika Hunter MRN: [id number] Date of Birth: 1971/04/01  Today's Date: 04/27/2017 Time: SLP Start Time (ACUTE ONLY): 5176 SLP Stop Time (ACUTE ONLY): 0928 SLP Time Calculation (min) (ACUTE ONLY): 10 min  Past Medical History:  Past Medical History:  Diagnosis Date  . Alcohol dependence ([place]) 01/24/2017  . Anxiety    panic disorder  . Asthma   . Brain cancer ([place])   . Cervical dysplasia 11/19/1988   S/P conization  . COPD (chronic obstructive pulmonary disease) ([place]) 01/24/2017  . Depression   . Hyperlipidemia   . Lung cancer ([place])   . Steatohepatitis    h/o heavy alcohol use, hepatitis profile negative for A, B   Past Surgical History:  Past Surgical History:  Procedure Laterality Date  . Cold knife conization  Early 2000s   Pre-cancerous lesions  . DILATION AND CURETTAGE OF UTERUS    . 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 She was found down at home and incontinent of urine and emesis. She was also minimally responsive. MRI 04/26/17 showed two total intracranial metastases involving the right frontal and left occipital lobes, lesions are decreased in size relative to previous MRI from 01/18/2017. Vasogenic edema about the right frontal lesion is slightly improved, while the edema about the left occipital lesion is slightly worsened. Per palliative medicine, the plan  overall is to resume Hospice (either at home, or at a residential hospice facility if needed), however at this point we are in a place of supportive care and watchful waiting to determine if she will have any mental recovery, and if there is any benefit to further radiation. Pt has no prior history of swallowing difficulties or evaluations in chart.   Assessment / Plan / Recommendation Clinical Impression  Patient presents with mild aspiration risk given current mentation. She is alert and responding verbally to SLP, states her name and birthday, follows 75% of basic commands, however refuses to complete tasks for thorough oral mechanism examination, stating, "No. I don't feel like it." Pt initially declines PO, however SLP educated re: current NPO status and purpose of swallowing evaluation to determine safest, least restrictive diet. Pt then agreed to assessment. Swallows thin liquids via straw in excess of 3 oz with appearance of timely swallow, no overt signs of aspiration noted. Purees with mild oral holding, followed by appearance of timely swallow initiation and adequate airway protection. No oral residue. Pt declined further trials, though her oral stage appears grossly functional for solids. Recommend initiating dys 2 diet with thin liquids and full supervision given limited assessment; if pocketing or significant oral holding noted with solids, may downgrade to dys 1. Recommend crushing meds in puree. SLP will f/u for tolerance, anticipate advancement of solids should mental status improve.   SLP Visit Diagnosis: Dysphagia, unspecified (R13.10)    Aspiration Risk  Mild aspiration risk    Diet Recommendation Dysphagia 2 (Fine chop);Thin liquid  Liquid Administration via: Cup;Straw Medication Administration: Crushed with puree Supervision: Patient able to self feed;Full supervision/cueing for compensatory strategies Compensations: Slow rate;Small sips/bites Postural Changes: Seated upright  at 90 degrees    Other  Recommendations Oral Care Recommendations: Oral care BID   Follow up Recommendations Other (comment) (TBD)      Frequency and Duration min 2x/week  2 weeks       Prognosis Prognosis for Safe Diet Advancement: Good Barriers to Reach Goals: Cognitive deficits      Swallow Study   General Date of Onset: 04/25/17 HPI: 46 y.o.femalewith past medical history of 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 She was found down at home and incontinent of urine and emesis. She was also minimally responsive. MRI 04/26/17 showed two total intracranial metastases involving the right frontal and left occipital lobes, lesions are decreased in size relative to previous MRI from 01/18/2017. Vasogenic edema about the right frontal lesion is slightly improved, while the edema about the left occipital lesion is slightly worsened. Per palliative medicine, the plan overall is to resume Hospice (either at home, or at a residential hospice facility if needed), however at this point we are in a place of supportive care and watchful waiting to determine if she will have any mental recovery, and if there is any benefit to further radiation. Pt has no prior history of swallowing difficulties or evaluations in chart. Type of Study: Bedside Swallow Evaluation Previous Swallow Assessment: none in chart Diet Prior to this Study: NPO Temperature Spikes Noted: No Respiratory Status: Room air History of Recent Intubation: No Behavior/Cognition: Alert;Doesn't follow directions;Requires cueing Oral Cavity Assessment: Within Functional Limits Oral Care Completed by SLP: No Oral Cavity - Dentition: Adequate natural dentition Vision: Functional for self-feeding Self-Feeding Abilities: Needs assist;Able to  feed self Patient Positioning: Upright in bed Baseline Vocal Quality: Normal Volitional Cough: Cognitively unable to elicit Volitional Swallow: Unable to elicit    Oral/Motor/Sensory Function Overall Oral Motor/Sensory Function: Other (comment) (limited assessment as pt refusing to follow some commands)   Ice Chips Ice chips: Not tested Other Comments: refused   Thin Liquid Thin Liquid: Within functional limits Presentation: Cup;Straw;Self Fed    Nectar Thick Nectar Thick Liquid: Not tested   Honey Thick Honey Thick Liquid: Not tested   Puree Puree: Impaired Presentation: Self Fed;Spoon Oral Phase Functional Implications: Oral holding   Solid   GO   Solid: Not tested Other Comments: pt refused        Aliene Altes 04/27/2017,9:53 AM  Deneise Lever, [place], [place] Pathologist [phone]

## 2017-04-27 NOTE — Discharge Summary (Signed)
Physician Discharge Summary  Erika Hunter:[id number] DOB: 31-Jan-1971 DOA: 04/25/2017  PCP: Wardell Honour, MD Oncologist Dr. Burr Medico  Admit date: 04/25/2017 Discharge date: 04/27/2017  Admitted From: Home  Disposition: Home with hospice services  Recommendations for Outpatient Follow-up:  1. Follow up with PCP in 1 weeks  Discharge Condition:Hospice CODE STATUS:DNR  Brief/Interim Summary: HPI: Erika Hunter is a 46 y.o. 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 History is obtained from the patient's friend who helps look after her as the patient is currently unable to provide her own. Reportedly sometime around 2 PM the patient was found at home unconscious with her bed/mattress on top of her. On the scene patient was noted to have vomitus with the urinary incontinence. Since being found the patient has not returned to her normal self. Patient for the will open eyes, but not following commands. Normally patient had been able to complete most of her ADLs without assistance. However, over this last week or so the patient had been complaining of severe headaches and hearing a swishing heartbeat sound in her ears.  She is followed by Dr. Burr Medico of oncology.The friend reports that the patient had been Prior and had been tapering down on her steroid dose after metastases to brain been found. She was on hospice, but had just recently stopped this service in hopes of obtaining a repeat MRI of her brain.  ED Course: Upon admission into the emergency department patient was seen to be afebrile, heart rate 91-111, respirations 17-24, and other vitals stable. Neurology was consulted as patient was suspected to possibly have a seizure given history of metastatic brain lesions and recommended giving the patient 10 mg of  dexamethasone and  loading the patient with  thousand milligrams of keppra IV. Labs revealed WBC 18.7, platelets 436. Sodium 134, potassium 2.6, chloride 96, glucose 162, and alcohol level undetectable. CT imaging showed similar metastatic lesions of the brain with vasogenic edema with possibly 2 mm of right-to-left midline shift and appears less than previous studies. X-ray imaging of the chest showed lung mass, but no acute signs of an infiltrate. Suspected seizure: Acute. Patient found down with vomitus and urinary incontinence. CT scan showing vasogenic edema with less right to left midline shift than previous scans. Neurology consult and recommended and given the patient 10 mg of Decadron along with 100 mg of Keppra IV. Patient previously on 500 mg twice a day. On physical exam patient stage appears to possibly be post ictal. - Admit to telemetry bed - Seizure admission order set initiated  - Ativan IV prn seizure activity - IVF Ns at 75 ml/hr - appreciate neurology consultative services, for follow-up for further recommendations per neuro team: MRI scan reveals no new findings and in fact shows some decrease in the size of her known brain metastases as well as the amount of edema surrounding these metastases when compared to prior scan in March 2018. At this point, she is not had any seizures here in hospital. Continue Keppra. Continue to optimize metabolic status as you are. Limit CNS active medications, unless being used for comfort. Agree with her oncologist that resuming hospice care would be appropriate.  2. Brain metastases: As noted above, neuroimaging shows slight improvement in her metastases on her most recent MRI scan. However, prognosis remains poor and would favor resumption of hospice.  3.  Cerebral edema: This is due to brain metastases. Continue steroids.  Pt and family decided to resume home hospice services which was arranged prior to discharge. Palliative medicine team consulted and assisted with care and disposition.     Discharge Diagnoses:  Principal Problem:   Seizure [facility]) Active Problems:   Small cell lung cancer, left ([facility])   Brain metastasis ([facility])   Hypokalemia   Post-ictal confusion   Acute encephalopathy    Discharge Instructions  Discharge Instructions    Increase activity slowly    Complete by:  As directed      Allergies as of 04/27/2017   No Known Allergies     Medication List    STOP taking these medications   acetaminophen 325 MG tablet Commonly known as:  TYLENOL   levofloxacin 750 MG tablet Commonly known as:  LEVAQUIN     TAKE these medications   albuterol 108 (90 Base) MCG/ACT inhaler Commonly known as:  PROAIR HFA INHALE 2 PUFFS INTO LUNGS EVERY 4 HOURS AS NEEDED FOR COUGH, SHORTNESS OF BREATH, OR WHEEZING   ALPRAZolam 0.5 MG tablet Commonly known as:  XANAX Take 1 tablet (0.5 mg total) by mouth 3 (three) times daily as needed for anxiety or sleep (seizure). What changed:  when to take this  reasons to take this   dexamethasone 4 MG tablet Commonly known as:  DECADRON Take 4 mg by mouth 2 (two) times daily with a meal.   fludrocortisone 0.1 MG tablet Commonly known as:  FLORINEF Take 3 tablets (0.3 mg total) by mouth daily. What changed:  how much to take  when to take this   ipratropium-albuterol 0.5-2.5 (3) MG/3ML Soln Commonly known as:  DUONEB Take 3 mLs by nebulization every 4 (four) hours as needed (SOB and Dyspnea).   levETIRAcetam 500 MG tablet Commonly known as:  KEPPRA Take 1 tablet (500 mg total) by mouth 2 (two) times daily.   midodrine 10 MG tablet Commonly known as:  PROAMATINE Take 1 tablet (10 mg total) by mouth 3 (three) times daily with meals.   oxyCODONE 5 MG immediate release tablet Commonly known as:  Oxy IR/ROXICODONE Take 1 tablet (5 mg total) by mouth every 4 (four) hours as needed for moderate pain.   potassium chloride SA 20 MEQ tablet Commonly known as:  K-DUR,KLOR-CON Take 1 tab three times daily x 3 days  then daily x 3 weeks.   venlafaxine XR 75 MG 24 hr capsule Commonly known as:  EFFEXOR-XR TAKE 1 CAPSULE BY MOUTH ONCE DAILY ALONG WITH 150MG  CAPSULE   venlafaxine XR 150 MG 24 hr capsule Commonly known as:  EFFEXOR-XR TAKE 1 CAPSULE BY MOUTH ONCE DAILY ALONG WITH 75MG  CAPSULE   zolpidem 12.5 MG CR tablet Commonly known as:  AMBIEN CR Take 1 tablet (12.5 mg total) by mouth at bedtime as needed for sleep.      Follow-up Information    Wardell Honour, MD. Schedule an appointment as soon as possible for a visit in 1 week(s).   Specialty:  Family Medicine Contact information: [place] [address] [phone]          No Known Allergies   Procedures/Studies: Ct Head Wo Contrast  Result Date: 04/25/2017 CLINICAL DATA:  Patient found on floor, unresponsive. History of stage IV brain and lung cancer. EXAM: CT HEAD WITHOUT CONTRAST CT CERVICAL SPINE WITHOUT CONTRAST TECHNIQUE: Multidetector CT imaging of the head and cervical spine was performed following the standard protocol without intravenous  contrast. Multiplanar CT image reconstructions of the cervical spine were also generated. COMPARISON:  01/18/2017 head CT and MRI FINDINGS: CT HEAD FINDINGS Brain: Known intra-axial mass lesions in the right frontal and left occipital lobes are again visualized. The largely cystic lesion in the left occipital lobe currently measures 4.7 x 2.8 x 3.7 cm versus 4.8 x 2.6 x 4.5 cm and demonstrates a mild-to-moderate amount of vasogenic edema without midline shift. No hemorrhage is noted. The mixed attenuating partially solid and partially cystic intra-axial right frontal lobe mass is estimated at 3.5 x 3 x 3.3 cm versus 4.8 x 4.2 x 4.4 cm previously, slightly smaller in appearance. It also demonstrates a moderate amount of vasogenic edema. Less right to left midline shift is demonstrated now approximately 2 mm. Slight positive mass effect on the frontal horn of the right lateral  ventricle as before though less so than on prior. No extra-axial fluid collections. No large vascular territory infarcts. No effacement of the basal cisterns. Fourth ventricle is midline. Vascular: Mild calcific atherosclerosis of the cavernous and paraclinoid internal carotid arteries. No hyperdense vessels. Skull: No fracture, suspicious lytic or blastic lesion of the skull. Sinuses/Orbits: Limited by motion artifacts. No significant paranasal sinus mucosal disease. Mastoids appear clear. Other: None CT CERVICAL SPINE FINDINGS Alignment: Normal. Skull base and vertebrae: No acute fracture. No primary bone lesion or focal pathologic process. C7 was initially incompletely included on the cervical spine study an additional views to include the thoracic inlet and C7 were acquired. Soft tissues and spinal canal: No prevertebral fluid or swelling. No visible canal hematoma. Disc levels: No focal disc herniation, central canal stenosis or significant neural foraminal encroachment. Upper chest: Scarring at the apices with mild biapical emphysematous change. Other: None IMPRESSION: 1. Known intra-axial mass lesions in the right frontal and left occipital lobes as above described, the right frontal lobe now measuring 4.7 x 2.8 x 3.7 cm and slightly smaller in appearance. Similarly a smaller cystic mass in the left occipital lobe now estimated at 3.5 x 3 x 3.3 cm is also noted within adjacent vasogenic edema as before. Less right to left midline shift now approximately 2 mm. 2. No new nor acute appearing CT abnormality within the brain. 3. No acute cervical spine fracture, lytic or blastic disease. No malalignment of the cervical spine. Electronically Signed   By: Ashley Royalty M.D.   On: 04/25/2017 18:54   Ct Cervical Spine Wo Contrast  Result Date: 04/25/2017 CLINICAL DATA:  Patient found on floor, unresponsive. History of stage IV brain and lung cancer. EXAM: CT HEAD WITHOUT CONTRAST CT CERVICAL SPINE WITHOUT CONTRAST  TECHNIQUE: Multidetector CT imaging of the head and cervical spine was performed following the standard protocol without intravenous contrast. Multiplanar CT image reconstructions of the cervical spine were also generated. COMPARISON:  01/18/2017 head CT and MRI FINDINGS: CT HEAD FINDINGS Brain: Known intra-axial mass lesions in the right frontal and left occipital lobes are again visualized. The largely cystic lesion in the left occipital lobe currently measures 4.7 x 2.8 x 3.7 cm versus 4.8 x 2.6 x 4.5 cm and demonstrates a mild-to-moderate amount of vasogenic edema without midline shift. No hemorrhage is noted. The mixed attenuating partially solid and partially cystic intra-axial right frontal lobe mass is estimated at 3.5 x 3 x 3.3 cm versus 4.8 x 4.2 x 4.4 cm previously, slightly smaller in appearance. It also demonstrates a moderate amount of vasogenic edema. Less right to left midline shift is demonstrated now approximately  2 mm. Slight positive mass effect on the frontal horn of the right lateral ventricle as before though less so than on prior. No extra-axial fluid collections. No large vascular territory infarcts. No effacement of the basal cisterns. Fourth ventricle is midline. Vascular: Mild calcific atherosclerosis of the cavernous and paraclinoid internal carotid arteries. No hyperdense vessels. Skull: No fracture, suspicious lytic or blastic lesion of the skull. Sinuses/Orbits: Limited by motion artifacts. No significant paranasal sinus mucosal disease. Mastoids appear clear. Other: None CT CERVICAL SPINE FINDINGS Alignment: Normal. Skull base and vertebrae: No acute fracture. No primary bone lesion or focal pathologic process. C7 was initially incompletely included on the cervical spine study an additional views to include the thoracic inlet and C7 were acquired. Soft tissues and spinal canal: No prevertebral fluid or swelling. No visible canal hematoma. Disc levels: No focal disc herniation,  central canal stenosis or significant neural foraminal encroachment. Upper chest: Scarring at the apices with mild biapical emphysematous change. Other: None IMPRESSION: 1. Known intra-axial mass lesions in the right frontal and left occipital lobes as above described, the right frontal lobe now measuring 4.7 x 2.8 x 3.7 cm and slightly smaller in appearance. Similarly a smaller cystic mass in the left occipital lobe now estimated at 3.5 x 3 x 3.3 cm is also noted within adjacent vasogenic edema as before. Less right to left midline shift now approximately 2 mm. 2. No new nor acute appearing CT abnormality within the brain. 3. No acute cervical spine fracture, lytic or blastic disease. No malalignment of the cervical spine. Electronically Signed   By: Ashley Royalty M.D.   On: 04/25/2017 18:54   Mr Jeri Cos CZ Contrast  Result Date: 04/26/2017 CLINICAL DATA:  Initial evaluation for brain metastases. EXAM: MRI HEAD WITHOUT AND WITH CONTRAST TECHNIQUE: Multiplanar, multiecho pulse sequences of the brain and surrounding structures were obtained without and with intravenous contrast. CONTRAST:  51mL MULTIHANCE GADOBENATE DIMEGLUMINE 529 MG/ML IV SOLN COMPARISON:  Prior CT from 04/25/2017 and previous MRI from 01/18/2017. FINDINGS: Brain: Large heterogeneous [place]ward enhancing metastatic lesion at the anterior right frontal lobe again seen. This measures 3.9 x 3.4 x 3.8 cm on today's study (series 13, image 13). Internal susceptibility artifact with intrinsic T1 signal intensity consistent with necrosis and blood products. Large amount of surrounding vasogenic edema. Overall, size of this lesion is decreased relative to previous MRI (previously 5.1 x 4.4 x 5.2 cm). Associated vasogenic edema slightly improved as well. Large cystic metastasis at the left occipital lobe again seen, measuring 5.0 x 2.8 x 4.1 cm (series 13, image 12). This is also slightly decreased in size (previously 5.7 x 3.4 x 5.0 cm). Surrounding vasogenic  edema about this lesion is increased. Previously noted small 5 mm metastasis at the parasagittal left frontal lobe no longer seen. No other definite new lesions. Single 4 mm focus of nodular enhancement at the parasagittal left frontal lobe on coronal sequence noted (series 14, image 25), not seen on corresponding sequences, and suspected to be artifactual. No evidence for acute infarct. Gray-white matter differentiation otherwise maintained. Persistent 5 mm of right-to-left midline shift. No hydrocephalus or ventricular trapping. No extra-axial fluid collection. Vascular: Major intracranial vascular flow voids are maintained. Skull and upper cervical spine: Craniocervical junction within normal limits. Visualized upper cervical spine unremarkable. Bone marrow signal intensity normal. No scalp soft tissue abnormality. Sinuses/Orbits: Globes and orbital soft tissues within normal limits. Mild scattered mucosal thickening within the paranasal sinuses. Paranasal sinuses are otherwise clear. No  mastoid effusion. Inner ear structures normal. Other: None. IMPRESSION: 1. Two total intracranial metastases involving the right frontal and left occipital lobes as above. Overall, these lesions are decreased in size relative to previous MRI from 01/18/2017, with nonvisualization of a smaller third lesion seen on that previous exam, consistent with interval response to therapy. Vasogenic edema about the right frontal lesion is slightly improved, while the edema about the left occipital lesion is slightly worsened. Persistent 5 mm right-to-left shift. No hydrocephalus or ventricular trapping. 2. No other acute intracranial process. Electronically Signed   By: Jeannine Boga M.D.   On: 04/26/2017 02:40   Dg Chest Portable 1 View  Result Date: 04/25/2017 CLINICAL DATA:  Stage IV brain and lung cancer altered mental status possible seizure EXAM: PORTABLE CHEST 1 VIEW COMPARISON:  03/25/2017, 01/21/2017 FINDINGS:  Hyperinflation. Left upper lobe lung mass measuring approximately 5 cm. Sclerotic lesion in the proximal left humerus unchanged. No pneumothorax. Apical scarring on the left. Stable cardiomediastinal silhouette. IMPRESSION: 1. Slight decrease in size of left upper lobe lung mass radiographically. 2. Hyperinflation without acute infiltrate. Electronically Signed   By: Donavan Foil M.D.   On: 04/25/2017 18:13      Subjective: No seizure activity reported.  Pt much more alert and says wants to go back home with hospice care services resumed.    Discharge Exam: Vitals:   04/27/17 0500 04/27/17 0900  BP: (!) 146/80 (!) 132/56  Pulse: 65 (!) 53  Resp: 18 18  Temp: 98.7 F (37.1 C) 98.8 F (37.1 C)   Vitals:   04/26/17 2121 04/27/17 0100 04/27/17 0500 04/27/17 0900  BP: (!) 152/85 (!) 145/69 (!) 146/80 (!) 132/56  Pulse: 85 75 65 (!) 53  Resp: 18 18 18 18   Temp: 98.6 F (37 C) 98 F (36.7 C) 98.7 F (37.1 C) 98.8 F (37.1 C)  TempSrc: Axillary Axillary Axillary Oral  SpO2: 99% 97% 97% 98%  Weight:      Height:       General: emaciated chronically ill appearing.   Cardiovascular:  S1/S2 +, no rubs, no gallops Respiratory: no increased WOB Abdominal: Soft, NT, ND, bowel sounds + Extremities:  no cyanosis Neurological: no focal findings   The results of significant diagnostics from this hospitalization (including imaging, microbiology, ancillary and laboratory) are listed below for reference.     Microbiology: No results found for this or any previous visit (from the past 240 hour(s)).   Labs: BNP (last 3 results) No results for input(s): BNP in the last 8760 hours. Basic Metabolic Panel:  Recent Labs Lab 04/24/17 1328 04/25/17 1746 04/26/17 0516  NA 138 134* 137  K 2.2* 2.6* 3.7  CL  --  96* 99*  CO2 34* 26 25  GLUCOSE 78 162* 154*  BUN 5.2* <5* <5*  CREATININE 0.6 0.66 0.50  CALCIUM 9.7 9.1 8.8*  MG  --   --  1.8   Liver Function Tests:  Recent Labs Lab  04/24/17 1328 04/25/17 1746  AST 11 20  ALT 7 10*  ALKPHOS 97 86  BILITOT 0.46 0.7  PROT 6.6 6.1*  ALBUMIN 3.2* 3.2*   No results for input(s): LIPASE, AMYLASE in the last 168 hours. No results for input(s): AMMONIA in the last 168 hours. CBC:  Recent Labs Lab 04/24/17 1328 04/25/17 1746 04/26/17 0516  WBC 6.8 18.7* 10.2  NEUTROABS 5.3 17.0* 9.5*  HGB 12.9 12.8 11.9*  HCT 38.8 39.4 36.3  MCV 92.6 92.7 91.0  PLT  376 436* 359   Cardiac Enzymes:  Recent Labs Lab 04/25/17 1746  CKTOTAL 48   BNP: Invalid input(s): POCBNP CBG:  Recent Labs Lab 04/27/17 0018 04/27/17 0423 04/27/17 0834 04/27/17 1136  GLUCAP 143* 133* 145* 104*   D-Dimer No results for input(s): DDIMER in the last 72 hours. Hgb A1c No results for input(s): HGBA1C in the last 72 hours. Lipid Profile No results for input(s): CHOL, HDL, LDLCALC, TRIG, CHOLHDL, LDLDIRECT in the last 72 hours. Thyroid function studies No results for input(s): TSH, T4TOTAL, T3FREE, THYROIDAB in the last 72 hours.  Invalid input(s): FREET3 Anemia work up No results for input(s): VITAMINB12, FOLATE, FERRITIN, TIBC, IRON, RETICCTPCT in the last 72 hours. Urinalysis    Component Value Date/Time   COLORURINE YELLOW 03/25/2017 1940   APPEARANCEUR CLEAR 03/25/2017 1940   LABSPEC >1.046 (H) 03/25/2017 1940   PHURINE 6.0 03/25/2017 1940   GLUCOSEU NEGATIVE 03/25/2017 1940   HGBUR MODERATE (A) 03/25/2017 1940   BILIRUBINUR NEGATIVE 03/25/2017 1940   BILIRUBINUR neg 08/24/2014 1703   KETONESUR NEGATIVE 03/25/2017 1940   PROTEINUR 100 (A) 03/25/2017 1940   UROBILINOGEN 0.2 08/24/2014 1703   NITRITE NEGATIVE 03/25/2017 1940   LEUKOCYTESUR NEGATIVE 03/25/2017 1940   Sepsis Labs Invalid input(s): PROCALCITONIN,  WBC,  LACTICIDVEN Microbiology No results found for this or any previous visit (from the past 240 hour(s)).  Time coordinating discharge: 27 mins  SIGNED:  Irwin Brakeman, MD  [facility] 04/27/2017, 11:46 AM Pager [phone]  If 7PM-7AM, please contact night-coverage [email or web address] Password [email or web address]

## 2017-04-27 NOTE — Progress Notes (Signed)
Neurology Progress Note  Subjective: No major 24-hour events reported. The patient is lying in bed with her eyes closed. She will nod and shake her head at times when I ask her questions. She followed some simple commands. However, ever with examination was extremely limited. She did not speak. Review of systems is extremely limited but she did indicate that she was not having any discomfort. There is no report of any seizure activity.  Medications reviewed and reconciled.   Pertinent meds: Decadron 6 motor grams IV every 6 hours Keppra 750 mg every 12 hours  Current Meds:   Current Facility-Administered Medications:  .  0.9 %  sodium chloride infusion, 250 mL, Intravenous, PRN, Johnson, Clanford L, MD .  dexamethasone (DECADRON) injection 6 mg, 6 mg, Intravenous, Q6H, Greta Doom, MD, 6 mg at 04/27/17 0537 .  enoxaparin (LOVENOX) injection 40 mg, 40 mg, Subcutaneous, QHS, Smith, Rondell A, MD, 40 mg at 04/26/17 2232 .  folic acid (FOLVITE) tablet 1 mg, 1 mg, Oral, Daily, Smith, Rondell A, MD .  levETIRAcetam (KEPPRA) 750 mg in sodium chloride 0.9 % 100 mL IVPB, 750 mg, Intravenous, Q12H, Greta Doom, MD, Stopped at 04/26/17 2251 .  LORazepam (ATIVAN) tablet 1 mg, 1 mg, Oral, Q6H PRN **OR** LORazepam (ATIVAN) injection 1 mg, 1 mg, Intravenous, Q6H PRN, Smith, Rondell A, MD .  LORazepam (ATIVAN) injection 1-2 mg, 1-2 mg, Intravenous, Q2H PRN, Tamala Julian, Rondell A, MD .  multivitamin with minerals tablet 1 tablet, 1 tablet, Oral, Daily, Smith, Rondell A, MD .  ondansetron (ZOFRAN) tablet 4 mg, 4 mg, Oral, Q6H PRN **OR** ondansetron (ZOFRAN) injection 4 mg, 4 mg, Intravenous, Q6H PRN, Smith, Rondell A, MD .  sodium chloride flush (NS) 0.9 % injection 3 mL, 3 mL, Intravenous, Q12H, Johnson, Clanford L, MD, 3 mL at 04/26/17 2232 .  sodium chloride flush (NS) 0.9 % injection 3 mL, 3 mL, Intravenous, PRN, Johnson, Clanford L, MD .  thiamine (VITAMIN B-1) tablet 100 mg, 100 mg,  Oral, Daily **OR** thiamine (B-1) injection 100 mg, 100 mg, Intravenous, Daily, Smith, Rondell A, MD  Objective:  Temp:  [98 F (36.7 C)-99.5 F (37.5 C)] 98.8 F (37.1 C) (06/09 0900) Pulse Rate:  [53-85] 53 (06/09 0900) Resp:  [18-19] 18 (06/09 0900) BP: (130-152)/(56-85) 132/56 (06/09 0900) SpO2:  [97 %-99 %] 98 % (06/09 0900) Weight:  [53.2 kg (117 lb 4.8 oz)] 53.2 kg (117 lb 4.8 oz) (06/08 1100)  General: Chronically ill-appearing woman lying in bed in NAD. She keeps her eyes closed throughout the examination and does not participate with the assessment. The exam is therefore limited to observation. She was able to perform simple midline and appendicular commands like wiggling her toes, thumbs up, stick out tongue. She does not participate any further than that, however. She was nonverbal.  HEENT: Neck is supple without lymphadenopathy.  CV: Regular, no murmur.  Lungs: CTAB  Extremities: No C/C/E. Neuro: This is extremely limited by poor cooperation. MS: As noted above.  CN: Pupils are equal and reactive from 4-->2 mm bilaterally.  Eyes are conjugate. Corneals are intact. Face is symmetric. Hearing appears to be intact to conversational voice.  Motor: Normal bulk, tone. No tremor or other abnormal movements are observed.   Labs: Lab Results  Component Value Date   WBC 10.2 04/26/2017   HGB 11.9 (L) 04/26/2017   HCT 36.3 04/26/2017   PLT 359 04/26/2017   GLUCOSE 154 (H) 04/26/2017   CHOL 195 08/24/2014  TRIG 104 08/24/2014   HDL 65 08/24/2014   LDLCALC 109 (H) 08/24/2014   ALT 10 (L) 04/25/2017   AST 20 04/25/2017   NA 137 04/26/2017   K 3.7 04/26/2017   CL 99 (L) 04/26/2017   CREATININE 0.50 04/26/2017   BUN <5 (L) 04/26/2017   CO2 25 04/26/2017   TSH 0.751 01/18/2017   INR 1.04 01/21/2017   CBC Latest Ref Rng & Units 04/26/2017 04/25/2017 04/24/2017  WBC 4.0 - 10.5 K/uL 10.2 18.7(H) 6.8  Hemoglobin 12.0 - 15.0 g/dL 11.9(L) 12.8 12.9  Hematocrit 36.0 - 46.0 % 36.3  39.4 38.8  Platelets 150 - 400 K/uL 359 436(H) 376    No results found for: HGBA1C Lab Results  Component Value Date   ALT 10 (L) 04/25/2017   AST 20 04/25/2017   ALKPHOS 86 04/25/2017   BILITOT 0.7 04/25/2017    Radiology:  There is no new neuroimaging.  A/P:   1. Acute encephalopathy: There is some concern that this may benefit result of unwitnessed seizure activity. MRI scan reveals no new findings and in fact shows some decrease in the size of her known brain metastases as well as the amount of edema surrounding these metastases when compared to prior scan in March 2018. At this point, she is not had any seizures here in hospital. Continue Keppra. Continue to optimize metabolic status as you are. Limit CNS active medications, unless being used for comfort. Agree with her oncologist that resuming hospice care would be appropriate.  2. Brain metastases: As noted above, neuroimaging shows slight improvement in her metastases on her most recent MRI scan. However, prognosis remains poor and would favor resumption of hospice.  3. Cerebral edema: This is due to brain metastases. Continue steroids. Could consider reducing dosage again given MRI findings noted above.  No further recommendations at this time. Neurology will sign off. Please call if there are any new issues that should arise.  Melba Coon, MD [facility]

## 2017-04-27 NOTE — Care Management Note (Addendum)
Case Management Note  Patient Details  Name: Erika Hunter MRN: [id number] Date of Birth: [birth year]  Subjective/Objective:                  Brain CA Action/Plan: Discharge planning Expected Discharge Date:   (Pending)               Expected Discharge Plan:  Home w Hospice Care  In-House Referral:     Discharge planning Services  CM Consult  Post Acute Care Choice:  Hospice Choice offered to:  Patient, Adult Children  DME Arranged:  N/A DME Agency:  NA  HH Arranged:  NA HH Agency:  NA  Status of Service:  Completed, signed off  If discussed at [place] of Stay Meetings, dates discussed:    Additional Comments: 13:42 CM received callback from [facility] [place] who states PTAR is not necessary as pt is going home in private vechicle.  No other CM needs were communicated.  CM received call from  MD as pt ready to go back home with [facility].  CM notified Briahna Pescador [phone] to confirm home hospice and Velna Hatchet confirms.  CM notified Olivia Mackie of [place].  CM will arrange for [facility] transport home when [facility] has admitted back into hospice. Mariane Masters, BSN, CM [phone]. Dellie Catholic, RN 04/27/2017, 11:21 AM

## 2017-04-30 ENCOUNTER — Telehealth: Payer: Self-pay

## 2017-04-30 NOTE — Telephone Encounter (Signed)
Erika Hunter with hospice called. The pt was admitted over the weekend. She is calling to confirm that Dr Burr Medico will be attending of record. Confirmation given.

## 2017-05-17 ENCOUNTER — Other Ambulatory Visit: Payer: Self-pay | Admitting: Hematology

## 2017-05-20 ENCOUNTER — Telehealth: Payer: Self-pay | Admitting: *Deleted

## 2017-05-20 NOTE — Telephone Encounter (Signed)
Received call from pt stating that she needs a refill on her oxycodone sent to her pharmacy.  Left message with pt to verify that she is still with Hospice.

## 2017-05-21 ENCOUNTER — Other Ambulatory Visit: Payer: Self-pay | Admitting: *Deleted

## 2017-05-21 ENCOUNTER — Telehealth: Payer: Self-pay | Admitting: *Deleted

## 2017-05-21 DIAGNOSIS — C3492 Malignant neoplasm of unspecified part of left bronchus or lung: Secondary | ICD-10-CM

## 2017-05-21 MED ORDER — MORPHINE SULFATE ER 30 MG PO TBCR: 30.0000 mg | EXTENDED_RELEASE_TABLET | Freq: Two times a day (BID) | ORAL | 0 refills | Status: AC

## 2017-05-21 NOTE — Telephone Encounter (Signed)
I recommend her to add MS contin 30mg  q12hr in addition to oxycodone. Please send prescription to her pharmacy. Thanks.   Truitt Merle MD

## 2017-05-21 NOTE — Telephone Encounter (Signed)
Spoke with Varney Biles, RN @ [place] and informed her of Dr. Ernestina Penna instructions.  Per VANVBT'Y request, asked pt's pharmacy not to refill Ibuprofen since MS Contin script will be faxed to pt's pharmacy.  Informed Varney Biles that pt can still take Oxycodone for breakthrough pain. Attempted to call pt unsuccessful.  Left message on voice mail of above info.

## 2017-05-21 NOTE — Telephone Encounter (Signed)
Spoke with Varney Biles, RN @ [place] reporting :  Pt has had increased pain from mid to lower back.   Has been taking Oxycodone  5 mg  3 tabs - total 15 mg - every 4 hours as needed to help with back pain  WITHOUT relief.  Pt also takes  Ibuprofen 200 mg -  5 to 6 tabs daily to help with the pain - again NOT MUCH Relief.   Stated bowel functions fine.  Varney Biles wanted to know what Dr. Burr Medico would recommend. Confirmed with Varney Biles that pt is still with Hospice services. [id number]     Phone     [phone].

## 2017-05-28 ENCOUNTER — Telehealth: Payer: Self-pay | Admitting: *Deleted

## 2017-05-28 NOTE — Telephone Encounter (Signed)
Varney Biles, RN @ [place] called to inform nurse re:  Pt had taken MS Contin for 2 doses only.  Stated the medication is not working, and also pt did not like to take the strong medication.   Pt would like to know if Dr. Burr Medico would increase Oxycodone dosage to help with the pain.     Pt also would like to know if Dr. Burr Medico would write a note stating that it is ok for pt to drive 3 - 4 miles around town. [id number]    Phone    [phone].

## 2017-06-03 ENCOUNTER — Other Ambulatory Visit: Payer: Self-pay | Admitting: *Deleted

## 2017-06-03 DIAGNOSIS — C3492 Malignant neoplasm of unspecified part of left bronchus or lung: Secondary | ICD-10-CM

## 2017-06-03 MED ORDER — OXYCODONE HCL 5 MG PO TABS: 5.0000 mg | ORAL_TABLET | ORAL | 0 refills | Status: AC | PRN

## 2017-06-17 ENCOUNTER — Other Ambulatory Visit: Payer: Self-pay | Admitting: Hematology

## 2017-06-19 ENCOUNTER — Other Ambulatory Visit: Payer: Self-pay | Admitting: *Deleted

## 2017-06-19 DIAGNOSIS — C3492 Malignant neoplasm of unspecified part of left bronchus or lung: Secondary | ICD-10-CM

## 2017-06-19 MED ORDER — GUAIFENESIN 100 MG/5ML PO SOLN: 10.0000 mL | Freq: Four times a day (QID) | ORAL | 0 refills | Status: AC | PRN

## 2017-06-20 ENCOUNTER — Ambulatory Visit: Admitting: Hematology

## 2017-06-21 ENCOUNTER — Other Ambulatory Visit: Payer: Self-pay | Admitting: Hematology

## 2017-06-21 NOTE — Telephone Encounter (Signed)
Pt released from hospital home with hospice. Dr Burr Medico is attending of record. Keppra refill request came in. This will be first refill of Keppra by Dr Burr Medico so placed as verbal order needing signature.

## 2017-07-01 ENCOUNTER — Telehealth: Payer: Self-pay | Admitting: *Deleted

## 2017-07-01 NOTE — Telephone Encounter (Signed)
Received notification from [facility] re :  Pt expired at [facility] on  07/03/2017 at 10 pm.

## 2017-07-20 DEATH — deceased

## 2017-09-20 IMAGING — CR DG KNEE COMPLETE 4+V*R*
4 series · 4 of 4 positions shown · non-contrast
Comparison: None.

CLINICAL DATA: Right anterior knee pain and swelling

EXAM:
RIGHT KNEE - COMPLETE 4+ VIEW

[t knee ap right]
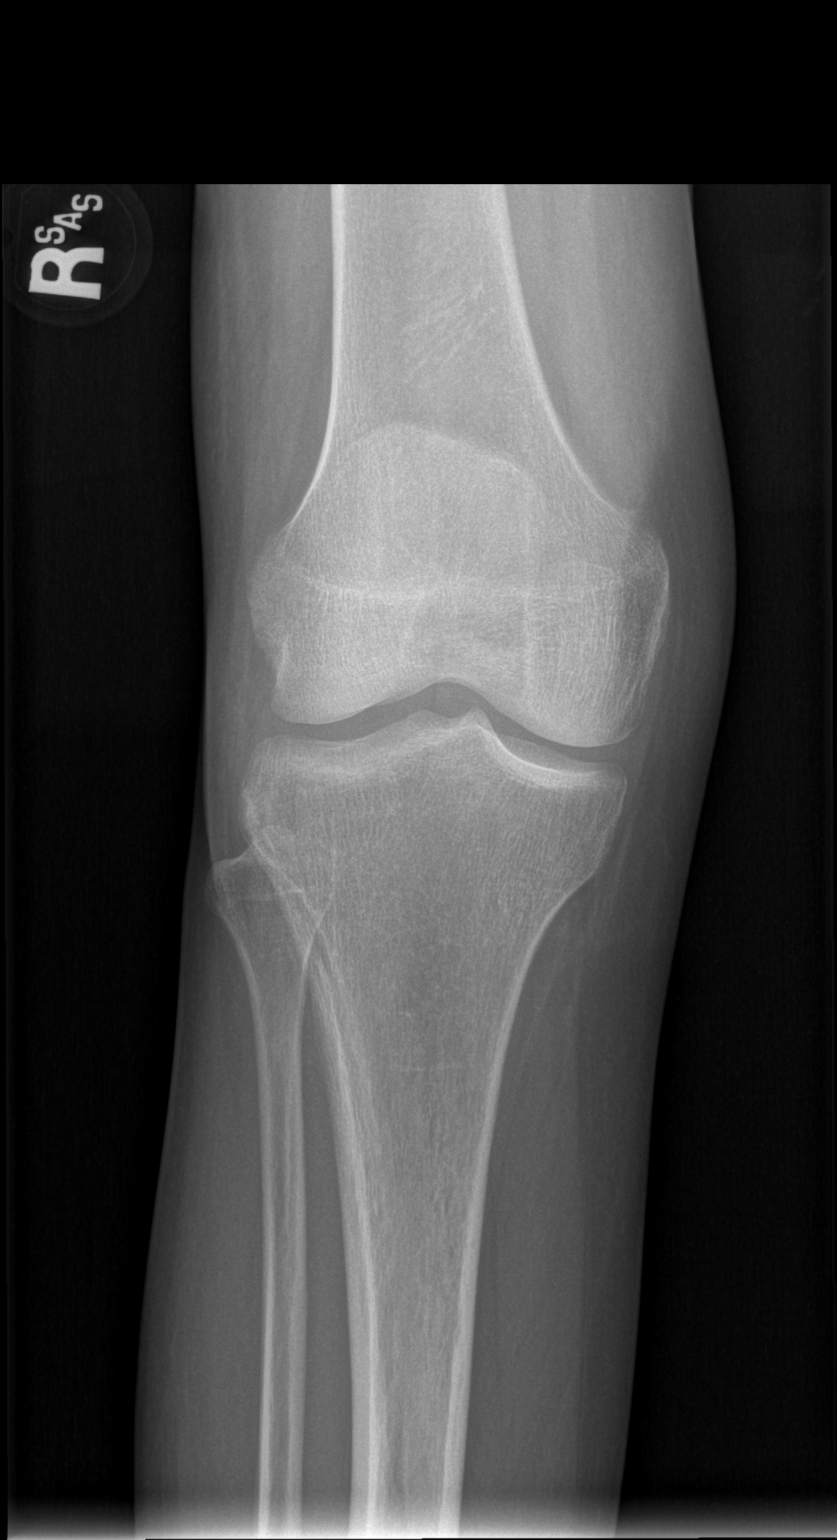

[t knee obl right (1 of 2)]
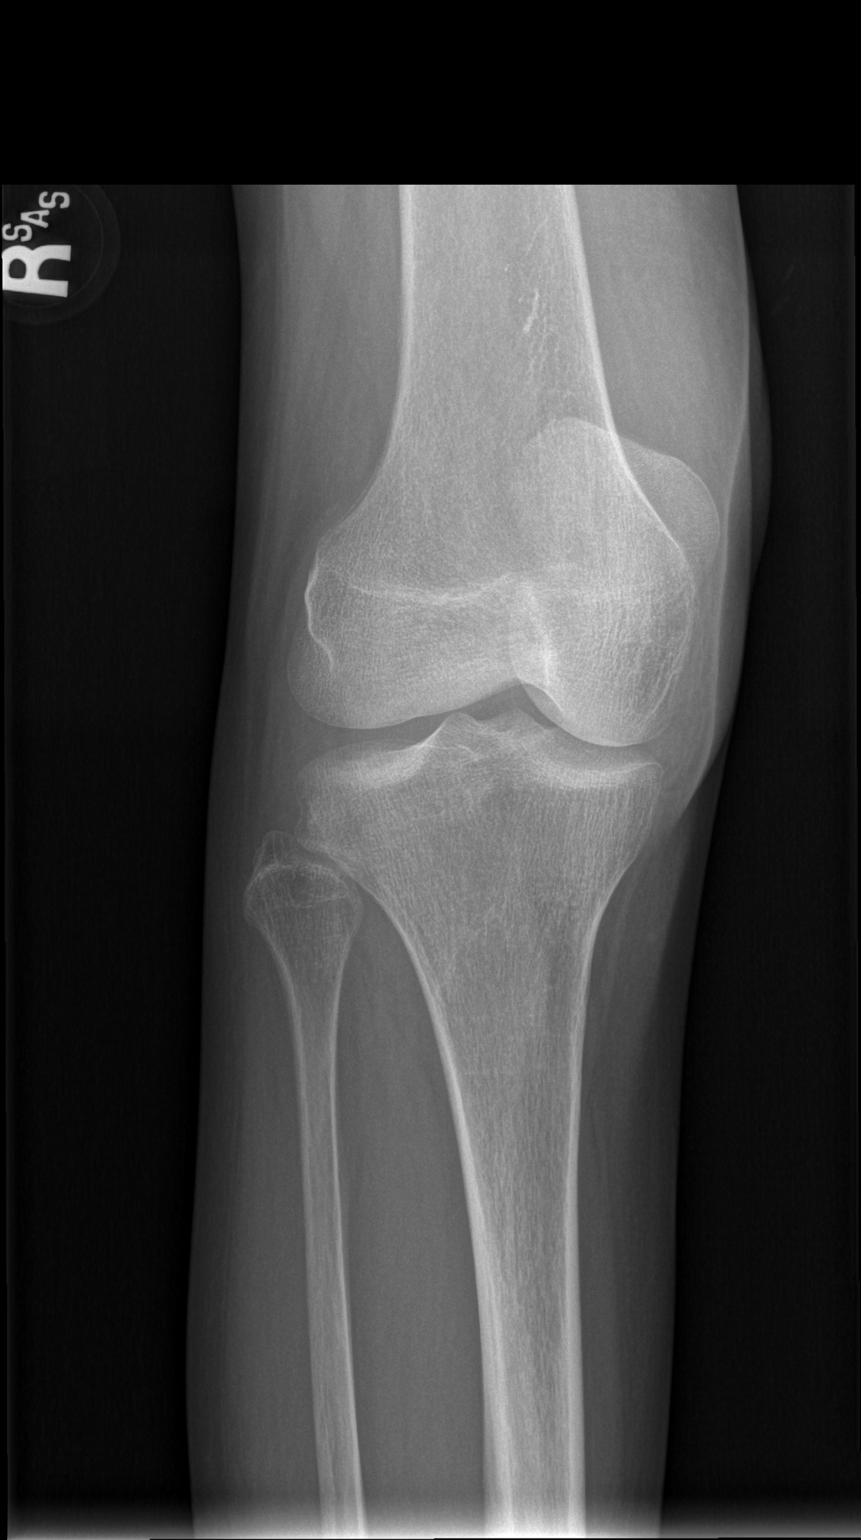

[t knee obl right (2 of 2)]
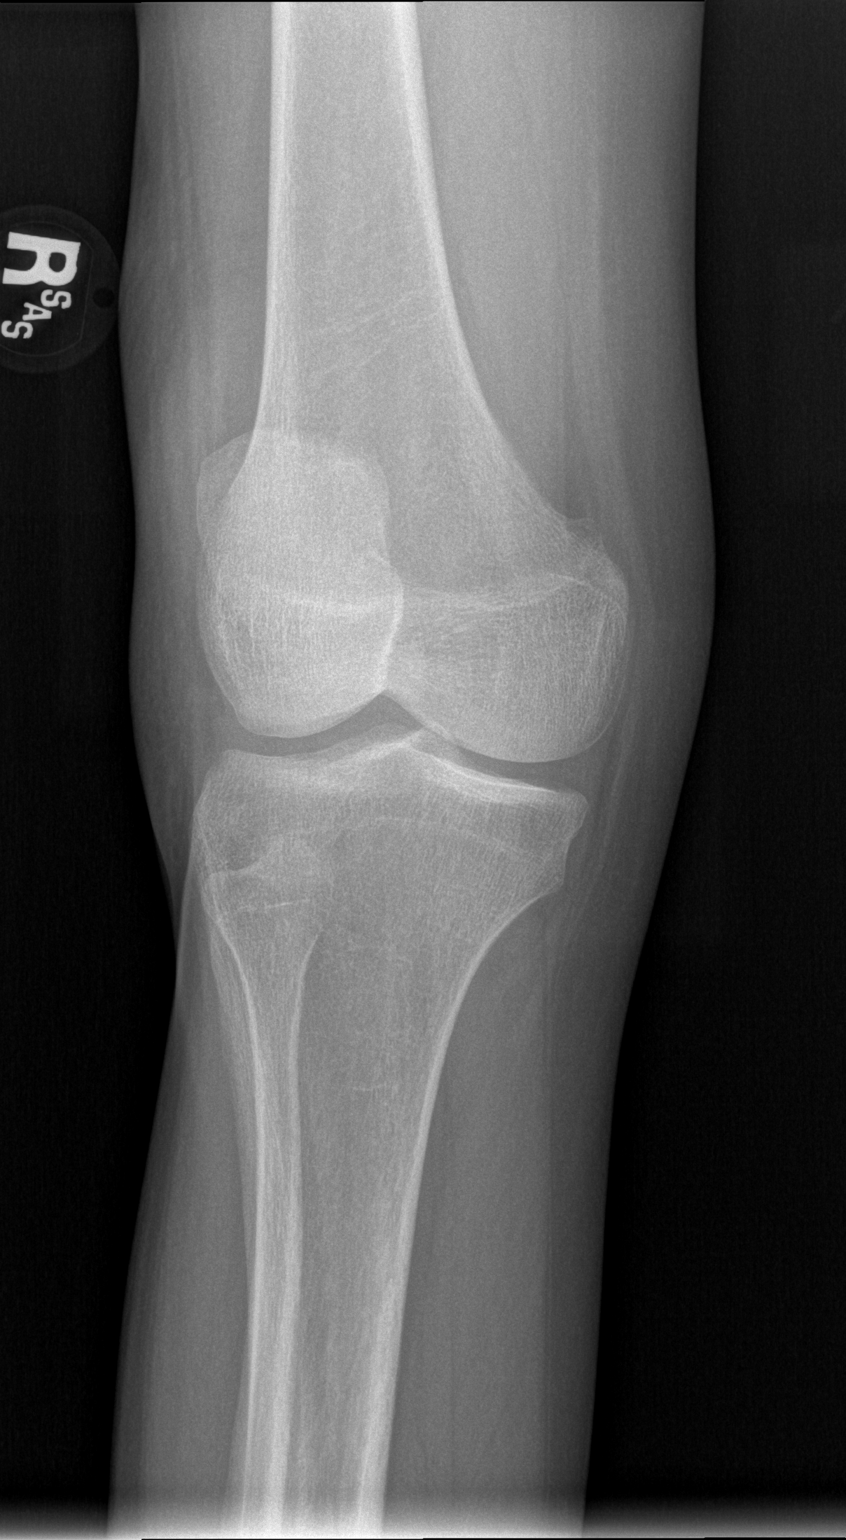

[t knee lat right]
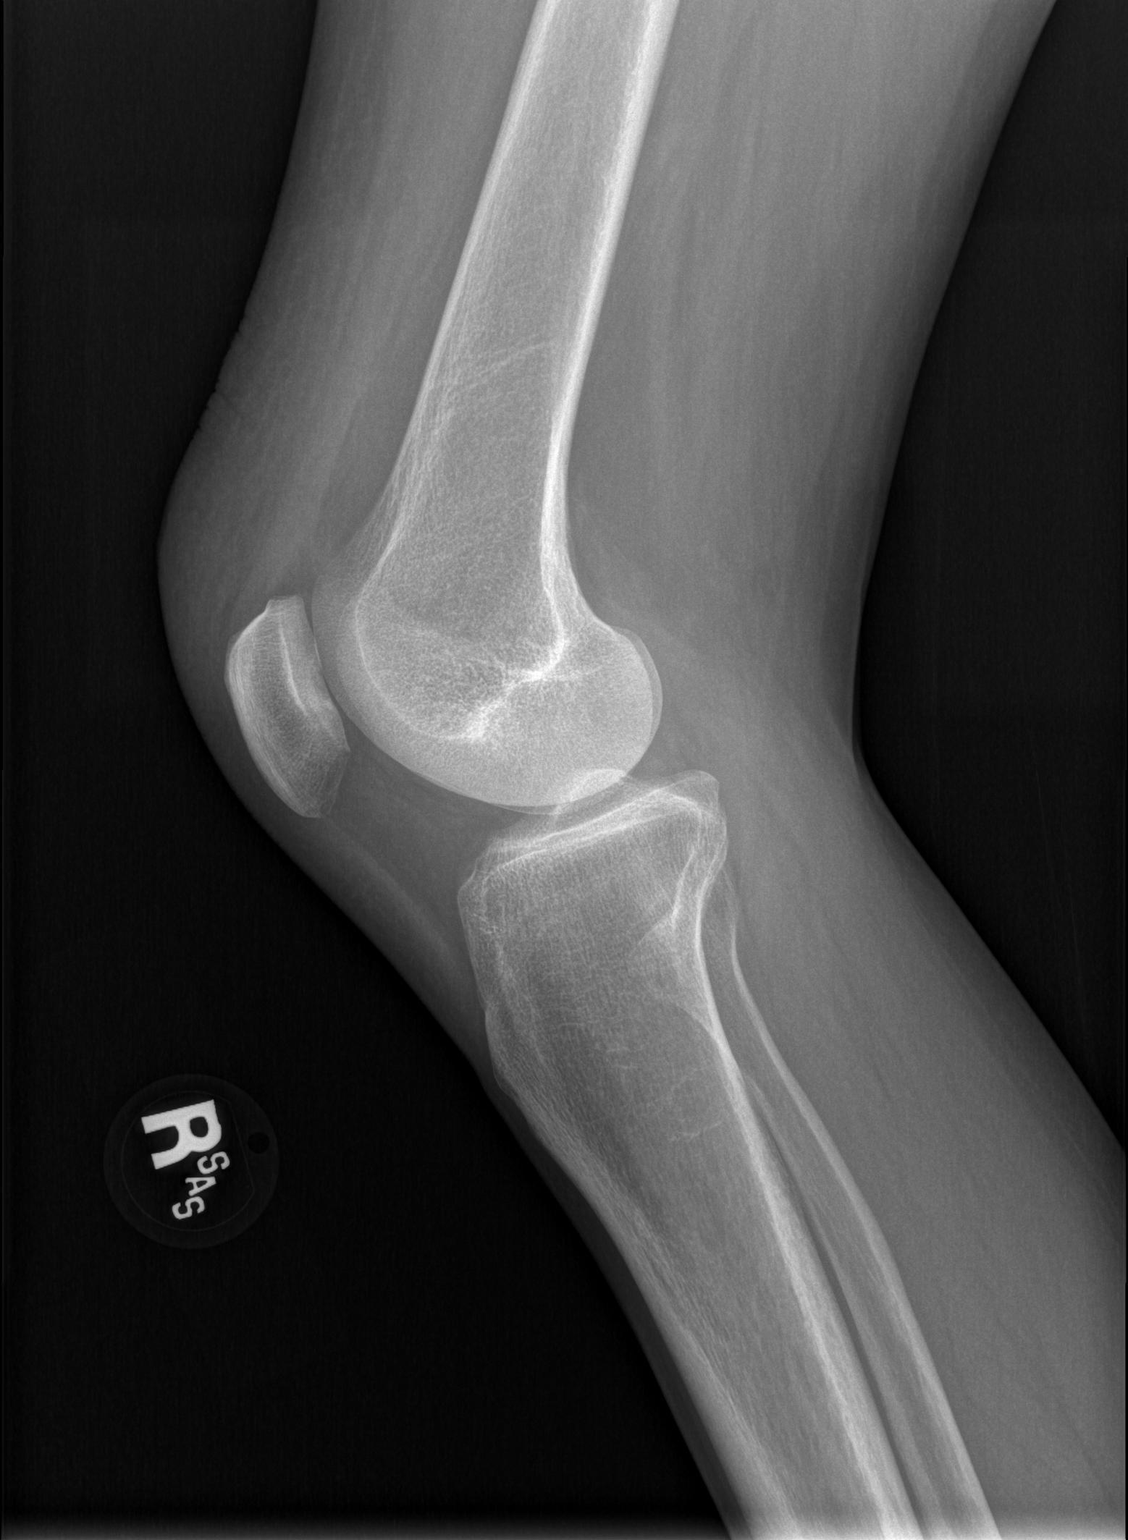

[4 of 4 positions shown; findings below may reference images not displayed]

FINDINGS: No fracture or malalignment. No significant joint effusion. The
joint space compartments are relatively maintained. Anterior soft
tissue swelling superior to the patella.
IMPRESSION: No acute osseous abnormality.

## 2017-11-23 IMAGING — CT CT HEAD W/O CM
5 of 9 series · 17 of 47 positions shown, 18 images · non-contrast
Comparison: 01/18/2017 head CT and MRI

CLINICAL DATA: Patient found on floor, unresponsive. History of
stage IV brain and lung cancer.

EXAM:
CT HEAD WITHOUT CONTRAST
CT CERVICAL SPINE WITHOUT CONTRAST
TECHNIQUE: Multidetector CT imaging of the head and cervical spine was
performed following the standard protocol without intravenous
contrast. Multiplanar CT image reconstructions of the cervical spine
were also generated.

[Series 6: head without · axial · non-contrast · 0.41mm/px · z∈[-85,+75]mm · 3 of 33 slices shown, 4 images]
[im 1/33  brain]
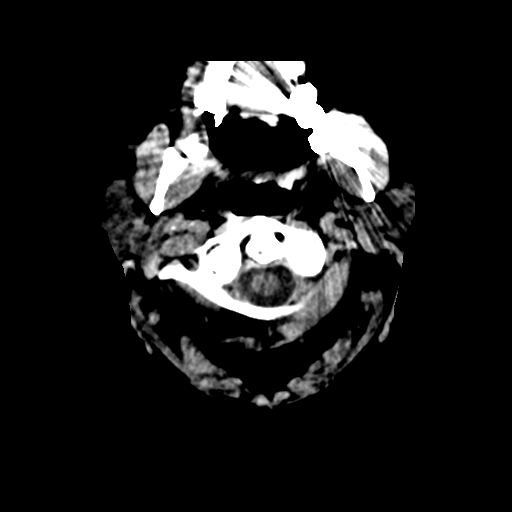
[im 1/33  bone]
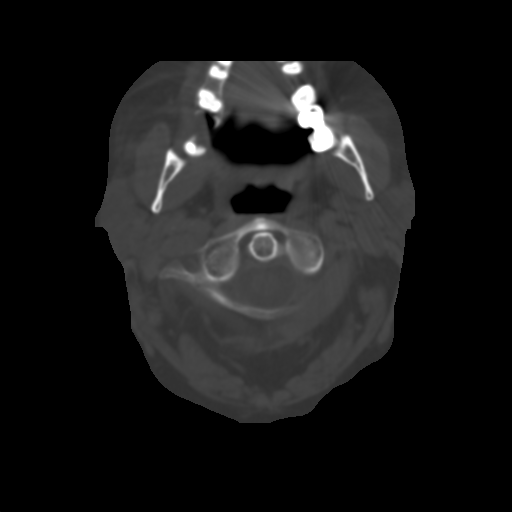
[im 17/33  brain]
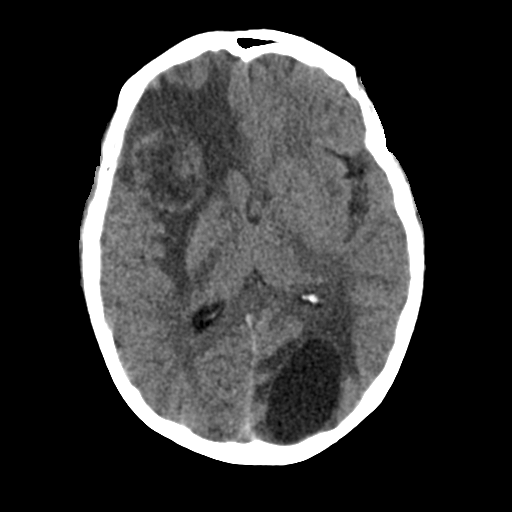
[im 33/33  brain]
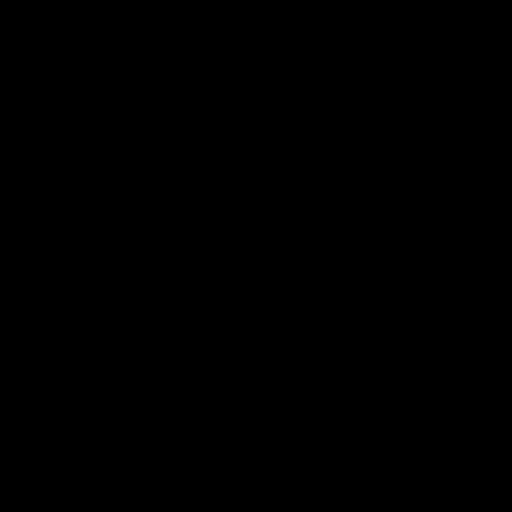

[Series 7: head bone · axial · 0.41mm/px · z∈[-65,+55]mm · 7 of 81 slices shown]
[im 11/81  bone]
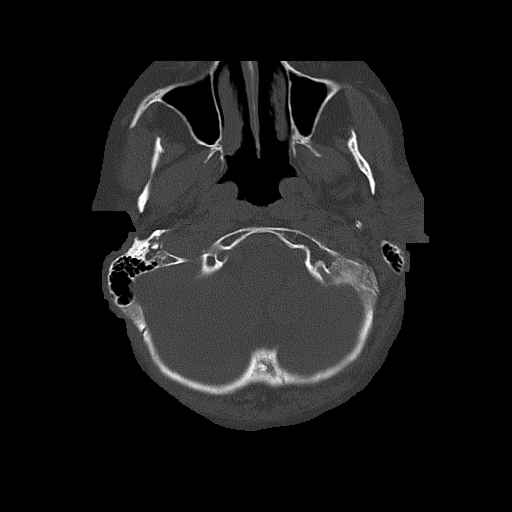
[im 21/81  bone]
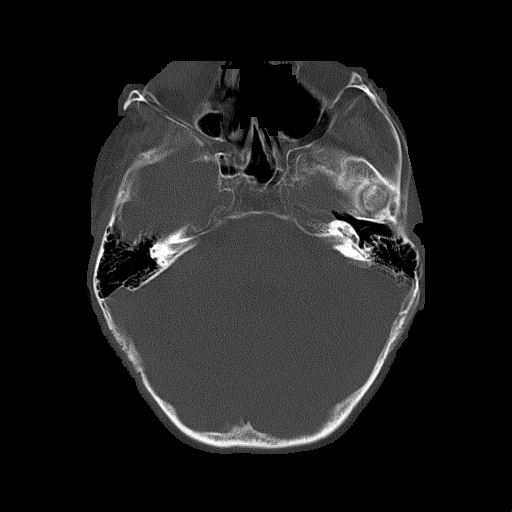
[im 31/81  bone]
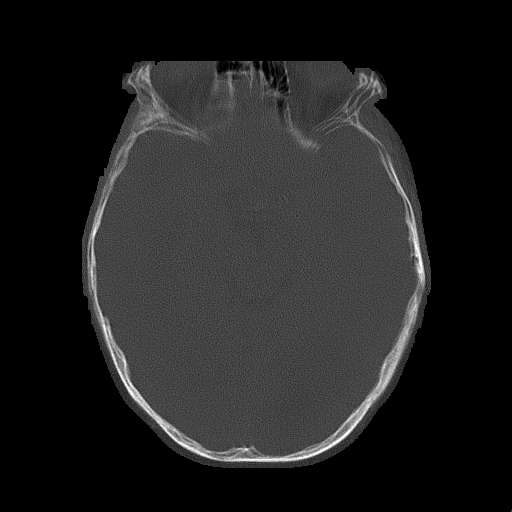
[im 41/81  bone]
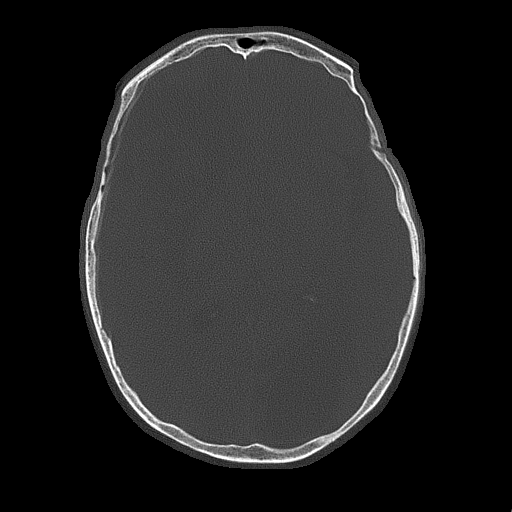
[im 51/81  bone]
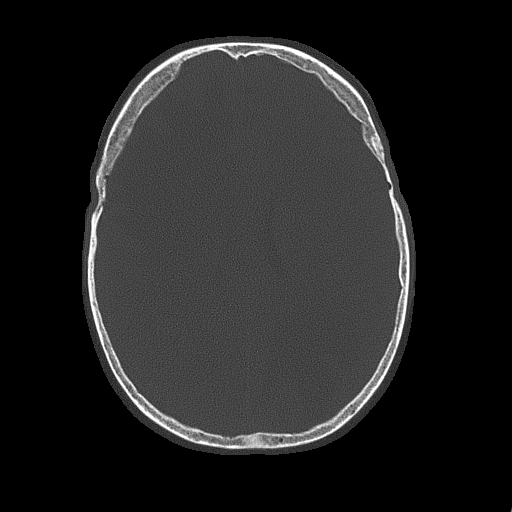
[im 61/81  bone]
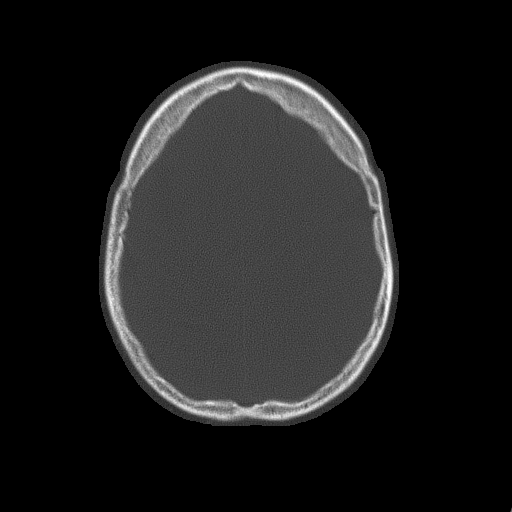
[im 71/81  bone]
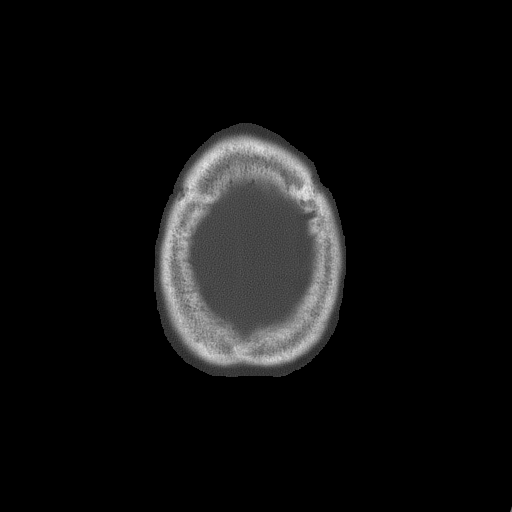

[Series 8: head without cor · coronal · non-contrast · 0.31mm/px · 2 of 67 slices shown]
[im 15/67  brain]
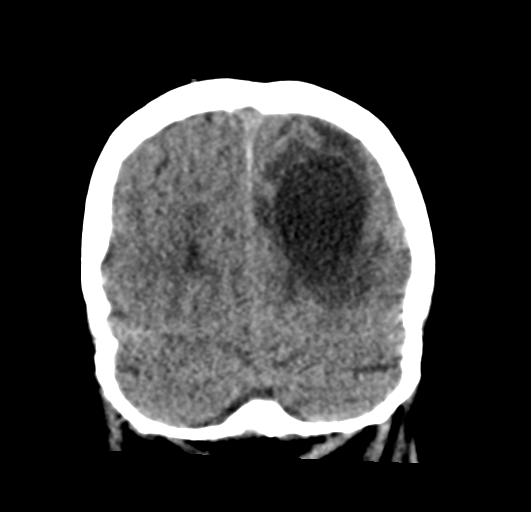
[im 29/67  brain]
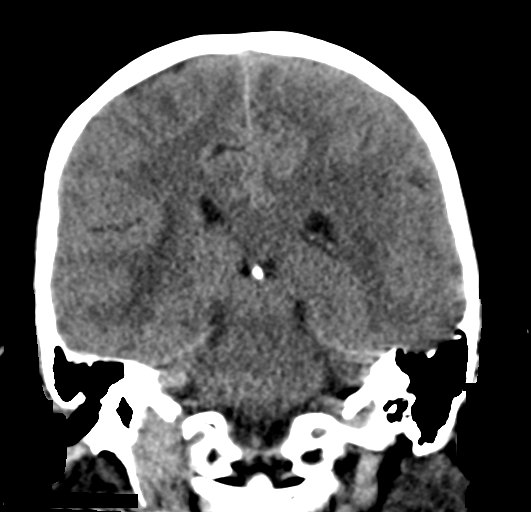

[Series 9: head without sag · sagittal · non-contrast · 0.32mm/px · 1 of 50 slices shown]
[im 25/50  brain]
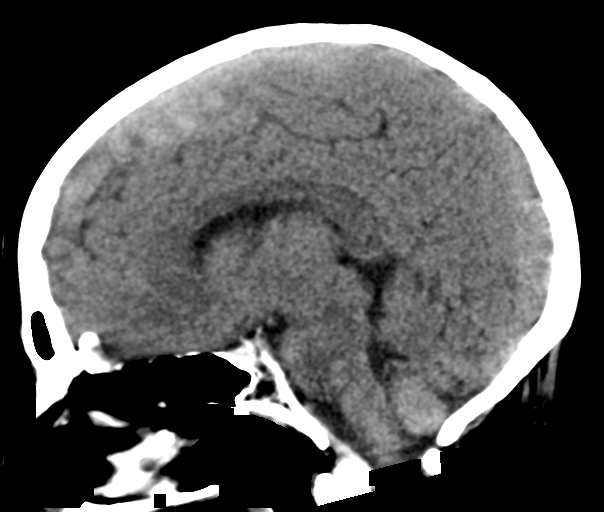

[Series 10: c_spine 2.0 st · axial · 0.31mm/px · z∈[-166,-104]mm · 4 of 73 slices shown]
[im 11/73  brain]
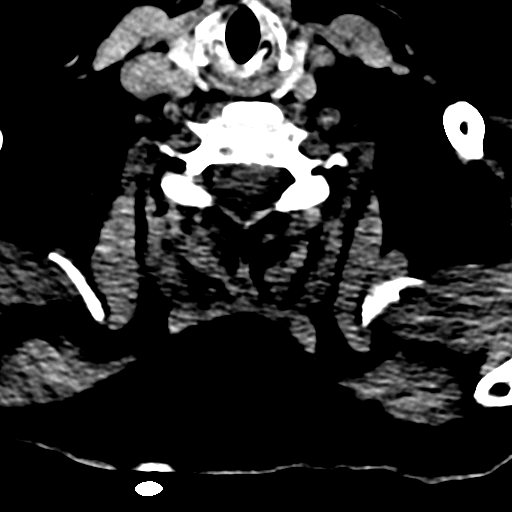
[im 21/73  brain]
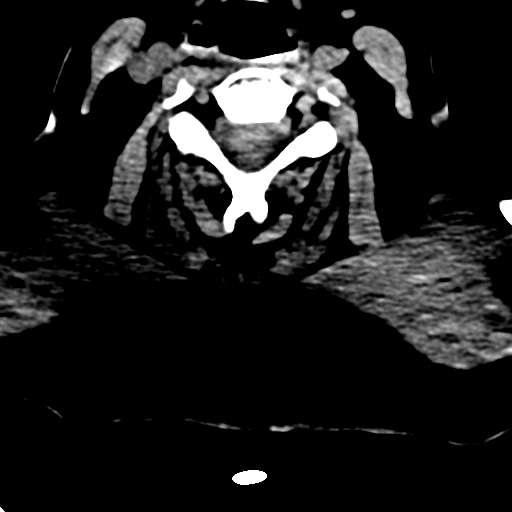
[im 31/73  brain]
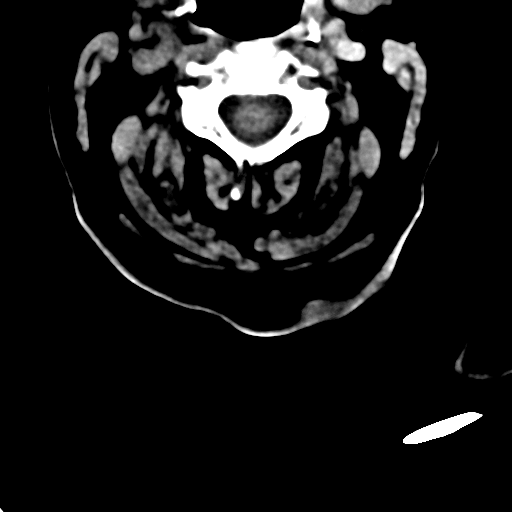
[im 42/73  brain]
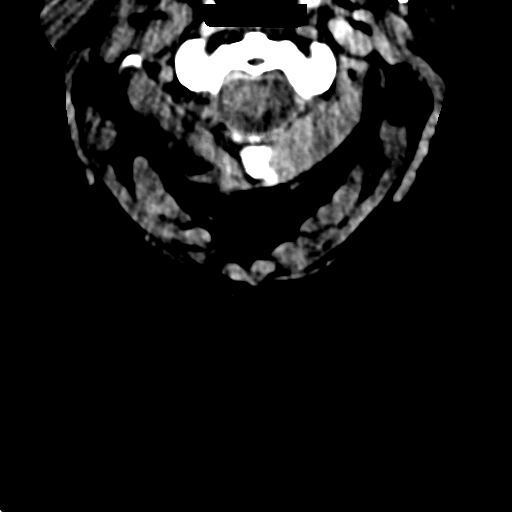

[17 of 47 positions shown; findings below may reference images not displayed]

FINDINGS: CT HEAD FINDINGS

Brain: Known intra-axial mass lesions in the right frontal and left
occipital lobes are again visualized. The largely cystic lesion in
the left occipital lobe currently measures 4.7 x 2.8 x 3.7 cm versus
4.8 x 2.6 x 4.5 cm and demonstrates a mild-to-moderate amount of
vasogenic edema without midline shift. No hemorrhage is noted. The
mixed attenuating partially solid and partially cystic intra-axial
right frontal lobe mass is estimated at 3.5 x 3 x 3.3 cm versus
x 4.2 x 4.4 cm previously, slightly smaller in appearance. It also
demonstrates a moderate amount of vasogenic edema. Less right to
left midline shift is demonstrated now approximately 2 mm. Slight
positive mass effect on the frontal horn of the right lateral
ventricle as before though less so than on prior. No extra-axial
fluid collections. No large vascular territory infarcts. No
effacement of the basal cisterns. Fourth ventricle is midline.

Vascular: Mild calcific atherosclerosis of the cavernous and
paraclinoid internal carotid arteries. No hyperdense vessels.

Skull: No fracture, suspicious lytic or blastic lesion of the skull.

Sinuses/Orbits: Limited by motion artifacts. No significant
paranasal sinus mucosal disease. Mastoids appear clear.

Other: None

CT CERVICAL SPINE FINDINGS

Alignment: Normal.

Skull base and vertebrae: No acute fracture. No primary bone lesion
or focal pathologic process. C7 was initially incompletely included
on the cervical spine study an additional views to include the
thoracic inlet and C7 were acquired.

Soft tissues and spinal canal: No prevertebral fluid or swelling. No
visible canal hematoma.

Disc levels: No focal disc herniation, central canal stenosis or
significant neural foraminal encroachment.

Upper chest: Scarring at the apices with mild biapical emphysematous
change.

Other: None
IMPRESSION: 1. Known intra-axial mass lesions in the right frontal and left
occipital lobes as above described, the right frontal lobe now
measuring 4.7 x 2.8 x 3.7 cm and slightly smaller in appearance.
Similarly a smaller cystic mass in the left occipital lobe now
estimated at 3.5 x 3 x 3.3 cm is also noted within adjacent
vasogenic edema as before. Less right to left midline shift now
approximately 2 mm.
2. No new nor acute appearing CT abnormality within the brain.
3. No acute cervical spine fracture, lytic or blastic disease. No
malalignment of the cervical spine.
